# Patient Record
Sex: Male | Born: 1960 | Race: Black or African American | Hispanic: No | Marital: Single | Smoking: Current every day smoker
Health system: Southern US, Community
[De-identification: ages and names within clinical notes are randomized; demographics above are authoritative.]

## PROBLEM LIST (undated history)

## (undated) DIAGNOSIS — C159 Malignant neoplasm of esophagus, unspecified: Secondary | ICD-10-CM

## (undated) DIAGNOSIS — I10 Essential (primary) hypertension: Secondary | ICD-10-CM

## (undated) DIAGNOSIS — M1990 Unspecified osteoarthritis, unspecified site: Secondary | ICD-10-CM

## (undated) DIAGNOSIS — Z923 Personal history of irradiation: Secondary | ICD-10-CM

## (undated) DIAGNOSIS — Z9221 Personal history of antineoplastic chemotherapy: Secondary | ICD-10-CM

## (undated) DIAGNOSIS — M25569 Pain in unspecified knee: Secondary | ICD-10-CM

## (undated) DIAGNOSIS — C419 Malignant neoplasm of bone and articular cartilage, unspecified: Secondary | ICD-10-CM

## (undated) HISTORY — DX: Unspecified osteoarthritis, unspecified site: M19.90

## (undated) HISTORY — PX: ESOPHAGOGASTRODUODENOSCOPY (EGD) WITH ESOPHAGEAL DILATION: SHX5812

## (undated) HISTORY — PX: KNEE SURGERY: SHX244

---
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
## 1997-12-16 ENCOUNTER — Inpatient Hospital Stay (HOSPITAL_COMMUNITY): Admission: EM | Admit: 1997-12-16 | Discharge: 1997-12-22 | Payer: Self-pay | Admitting: Emergency Medicine

## 1998-01-22 ENCOUNTER — Ambulatory Visit (HOSPITAL_COMMUNITY): Admission: RE | Admit: 1998-01-22 | Discharge: 1998-01-22 | Payer: Self-pay | Admitting: Hematology and Oncology

## 1998-02-02 ENCOUNTER — Ambulatory Visit (HOSPITAL_COMMUNITY): Admission: RE | Admit: 1998-02-02 | Discharge: 1998-02-02 | Payer: Self-pay | Admitting: Otolaryngology

## 1998-02-10 ENCOUNTER — Other Ambulatory Visit: Admission: RE | Admit: 1998-02-10 | Discharge: 1998-02-10 | Payer: Self-pay | Admitting: Hematology and Oncology

## 1998-11-17 ENCOUNTER — Encounter: Payer: Self-pay | Admitting: Family Medicine

## 1998-11-17 ENCOUNTER — Ambulatory Visit (HOSPITAL_COMMUNITY): Admission: RE | Admit: 1998-11-17 | Discharge: 1998-11-17 | Payer: Self-pay | Admitting: Family Medicine

## 1998-12-21 ENCOUNTER — Ambulatory Visit (HOSPITAL_COMMUNITY): Admission: RE | Admit: 1998-12-21 | Discharge: 1998-12-21 | Payer: Self-pay | Admitting: Family Medicine

## 1998-12-21 ENCOUNTER — Encounter: Payer: Self-pay | Admitting: Family Medicine

## 2001-02-07 ENCOUNTER — Emergency Department (HOSPITAL_COMMUNITY): Admission: EM | Admit: 2001-02-07 | Discharge: 2001-02-07 | Payer: Self-pay | Admitting: Emergency Medicine

## 2012-07-21 ENCOUNTER — Emergency Department (HOSPITAL_COMMUNITY)
Admission: EM | Admit: 2012-07-21 | Discharge: 2012-07-21 | Disposition: A | Discharge status: Home / Self Care | Payer: Self-pay | Source: Home / Self Care | Attending: Emergency Medicine | Admitting: Emergency Medicine

## 2012-07-21 ENCOUNTER — Encounter (HOSPITAL_COMMUNITY): Payer: Self-pay | Admitting: Emergency Medicine

## 2012-07-21 DIAGNOSIS — T148XXA Other injury of unspecified body region, initial encounter: Secondary | ICD-10-CM

## 2012-07-21 HISTORY — DX: Essential (primary) hypertension: I10

## 2012-07-21 MED ORDER — CYCLOBENZAPRINE HCL 5 MG PO TABS: 5.0000 mg | ORAL_TABLET | Freq: Three times a day (TID) | ORAL | Status: DC | PRN

## 2012-07-21 NOTE — ED Provider Notes (Signed)
Medical screening examination/treatment/procedure(s) were performed by non-physician practitioner and as supervising physician I was immediately available for consultation/collaboration.  Leslee Home, M.D.   Reuben Likes, MD 07/21/12 [phone]

## 2012-07-21 NOTE — ED Provider Notes (Signed)
History     CSN: [id number]  Arrival date & time 07/21/12  4098   First MD Initiated Contact with Patient 07/21/12 1052      Chief Complaint  Patient presents with  . Back Pain    strained back. sharp pain in shoulder blades radiates up neck and through out chest. hurts to cough. heavy lifting with work    (Consider location/radiation/quality/duration/timing/severity/associated sxs/prior treatment) Patient is a 51 y.o. male presenting with back pain. The history is provided by the patient.  Back Pain  This is a new problem. Episode onset: 3 days ago. The problem occurs constantly. The problem has not changed since onset.The pain is associated with lifting heavy objects. The pain is present in the thoracic spine. The quality of the pain is described as aching. Radiates to: R neck and shoulder. The pain is at a severity of 5/10. Exacerbated by: actively using muscle. The pain is the same all the time. Pertinent negatives include no chest pain, no fever, no numbness, no headaches, no tingling and no weakness. Treatments tried: flexall cream?? The treatment provided no relief.    Past Medical History  Diagnosis Date  . Hypertension     Past Surgical History  Procedure Date  . Knee surgery     History reviewed. No pertinent family history.  History  Substance Use Topics  . Smoking status: Current Every Day Smoker -- 0.5 packs/day    Types: Cigarettes  . Smokeless tobacco: Not on file  . Alcohol Use: No      Review of Systems  Constitutional: Negative for fever and chills.  HENT: Positive for neck pain.   Cardiovascular: Negative for chest pain.  Musculoskeletal: Positive for back pain.  Skin: Negative for rash and wound.  Neurological: Negative for tingling, weakness, numbness and headaches.    Allergies  Review of patient's allergies indicates no known allergies.  Home Medications   Current Outpatient Rx  Name  Route  Sig  Dispense  Refill  . CYCLOBENZAPRINE  HCL 5 MG PO TABS   Oral   Take 1 tablet (5 mg total) by mouth 3 (three) times daily as needed for muscle spasms.   21 tablet   0     BP 142/85  Pulse 85  Temp 98.7 F (37.1 C) (Oral)  Resp 16  SpO2 100%  Physical Exam  Constitutional: He appears well-developed and well-nourished. No distress.  Musculoskeletal:       Right shoulder: Normal.       Cervical back: He exhibits tenderness, swelling and spasm. He exhibits normal range of motion, no bony tenderness and no deformity.       Back:  Skin: Skin is warm, dry and intact. No rash noted. No erythema.    ED Course  Procedures (including critical care time)  Labs Reviewed - No data to display No results found.   1. Muscle strain       MDM          Cathlyn Parsons, NP 07/21/12 1109

## 2012-07-21 NOTE — ED Notes (Signed)
Pt c/o muscle strain in back. Sharp pain in shoulder blades that radiate up the back of the neck and chest pain with coughing. Pt states does a lot of heavy lifting at work.

## 2014-06-30 ENCOUNTER — Encounter: Payer: Self-pay | Admitting: Internal Medicine

## 2014-07-17 ENCOUNTER — Ambulatory Visit (AMBULATORY_SURGERY_CENTER): Payer: Self-pay | Admitting: *Deleted

## 2014-07-17 VITALS — Ht 69.0 in | Wt 131.0 lb

## 2014-07-17 DIAGNOSIS — Z1211 Encounter for screening for malignant neoplasm of colon: Secondary | ICD-10-CM

## 2014-07-17 MED ORDER — MOVIPREP 100 G PO SOLR: 1.0000 | Freq: Once | ORAL | Status: DC

## 2014-07-17 NOTE — Progress Notes (Signed)
No egg or soy allergy. No anesthesia problems.  No home O2.  No diet meds.  

## 2014-08-12 ENCOUNTER — Encounter: Payer: Self-pay | Admitting: Internal Medicine

## 2014-08-12 ENCOUNTER — Ambulatory Visit (AMBULATORY_SURGERY_CENTER): Payer: 59 | Admitting: Internal Medicine

## 2014-08-12 VITALS — BP 147/73 | HR 49 | Temp 98.1°F | Resp 30 | Ht 69.0 in | Wt 131.0 lb

## 2014-08-12 DIAGNOSIS — Z1211 Encounter for screening for malignant neoplasm of colon: Secondary | ICD-10-CM

## 2014-08-12 MED ORDER — SODIUM CHLORIDE 0.9 % IV SOLN: 500.0000 mL | INTRAVENOUS | Status: DC

## 2014-08-12 NOTE — Progress Notes (Signed)
A/ox3 pleased with MAC, report to Annette RN 

## 2014-08-12 NOTE — Patient Instructions (Signed)
YOU HAD AN ENDOSCOPIC PROCEDURE TODAY AT THE [facility]: Refer to the procedure report that was given to you for any specific questions about what was found during the examination.  If the procedure report does not answer your questions, please call your gastroenterologist to clarify.  If you requested that your care partner not be given the details of your procedure findings, then the procedure report has been included in a sealed envelope for you to review at your convenience later.  YOU SHOULD EXPECT: Some feelings of bloating in the abdomen. Passage of more gas than usual.  Walking can help get rid of the air that was put into your GI tract during the procedure and reduce the bloating. If you had a lower endoscopy (such as a colonoscopy or flexible sigmoidoscopy) you may notice spotting of blood in your stool or on the toilet paper. If you underwent a bowel prep for your procedure, then you may not have a normal bowel movement for a few days.  DIET: Your first meal following the procedure should be a light meal and then it is ok to progress to your normal diet.  A half-sandwich or bowl of soup is an example of a good first meal.  Heavy or fried foods are harder to digest and may make you feel nauseous or bloated.  Likewise meals heavy in dairy and vegetables can cause extra gas to form and this can also increase the bloating.  Drink plenty of fluids but you should avoid alcoholic beverages for 24 hours.  ACTIVITY: Your care partner should take you home directly after the procedure.  You should plan to take it easy, moving slowly for the rest of the day.  You can resume normal activity the day after the procedure however you should NOT DRIVE or use heavy machinery for 24 hours (because of the sedation medicines used during the test).    SYMPTOMS TO REPORT IMMEDIATELY: A gastroenterologist can be reached at any hour.  During normal business hours, 8:30 AM to 5:00 PM Monday through Friday,  call [phone].  After hours and on weekends, please call the GI answering service at [phone] who will take a message and have the physician on call contact you.   Following lower endoscopy (colonoscopy or flexible sigmoidoscopy):  Excessive amounts of blood in the stool  Significant tenderness or worsening of abdominal pains  Swelling of the abdomen that is new, acute  Fever of 100F or higher  FOLLOW UP: If any biopsies were taken you will be contacted by phone or by letter within the next 1-3 weeks.  Call your gastroenterologist if you have not heard about the biopsies in 3 weeks.  Our staff will call the home number listed on your records the next business day following your procedure to check on you and address any questions or concerns that you may have at that time regarding the information given to you following your procedure. This is a courtesy call and so if there is no answer at the home number and we have not heard from you through the emergency physician on call, we will assume that you have returned to your regular daily activities without incident.  SIGNATURES/CONFIDENTIALITY: You and/or your care partner have signed paperwork which will be entered into your electronic medical record.  These signatures attest to the fact that that the information above on your After Visit Summary has been reviewed and is understood.  Full responsibility of the confidentiality of this   discharge information lies with you and/or your care-partner.     Handouts were given to your care partner on a high fiber diet with liberal fluid intake and hemorrhoids. You may resume your current medications today. Please call if any questions or concerns.

## 2014-08-12 NOTE — Op Note (Signed)
[place]  [address]. [place], [address]   COLONOSCOPY PROCEDURE REPORT  PATIENT: Samuel Henderson, Samuel Henderson  MR#: [id number] BIRTHDATE: 1960-11-22 , 25  yrs. old GENDER: male ENDOSCOPIST: Jerene Bears, MD REFERRED AJ:GOTLXB Philip Aspen, M.D. PROCEDURE DATE:  08/12/2014 PROCEDURE:   Colonoscopy, screening First Screening Colonoscopy - Avg.  risk and is 50 yrs.  old or older Yes.  Prior Negative Screening - Now for repeat screening. N/A  History of Adenoma - Now for follow-up colonoscopy & has been > or = to 3 yrs.  N/A  Polyps Removed Today? No.  Recommend repeat exam, <10 yrs? Polyps Removed Today? No.  Recommend repeat exam, <10 yrs? No. ASA CLASS:   Class II INDICATIONS:average risk for colon cancer and first colonoscopy. MEDICATIONS: Propofol 350 mg IV and Monitored anesthesia care  DESCRIPTION OF PROCEDURE:   After the risks benefits and alternatives of the procedure were thoroughly explained, informed consent was obtained.  The digital rectal exam revealed no abnormalities of the rectum.   The LB [id number]  endoscope was introduced through the anus and advanced to the cecum, which was identified by both the appendix and ileocecal valve. No adverse events experienced.   The quality of the prep was good, using MoviPrep  The instrument was then slowly withdrawn as the colon was fully examined.      COLON FINDINGS: A normal appearing cecum, ileocecal valve, and appendiceal orifice were identified.  The ascending, transverse, descending, sigmoid colon, and rectum appeared unremarkable. Retroflexed views revealed medium-sized internal hemorrhoids. The time to cecum=2 minutes 54 seconds.  Withdrawal time=7 minutes 56 seconds.  The scope was withdrawn and the procedure completed.  COMPLICATIONS: There were no immediate complications.  ENDOSCOPIC IMPRESSION: Normal colonoscopy, internal hemorrhoids  RECOMMENDATIONS: You should continue to follow  colorectal cancer screening guidelines for "routine risk" patients with a repeat colonoscopy in 10 years. There is no need for FOBT (stool) testing for at least 5 years.  eSigned:  Jerene Bears, MD 08/12/2014 3:09 PM   cc: Leanna Battles, MD and The Patient

## 2014-08-12 NOTE — Progress Notes (Signed)
No problems noted in the recovery room. maw 

## 2014-08-13 ENCOUNTER — Telehealth: Payer: Self-pay | Admitting: *Deleted

## 2014-08-13 NOTE — Telephone Encounter (Signed)
  Follow up Call-  Call back number 08/12/2014  Post procedure Call Back phone  # [phone] or [phone] (leave message here)  Permission to leave phone message Yes     Patient questions:  Do you have a fever, pain , or abdominal swelling? No. Pain Score  0 *  Have you tolerated food without any problems? Yes.    Have you been able to return to your normal activities? Yes.    Do you have any questions about your discharge instructions: Diet   No. Medications  No. Follow up visit  No.  Do you have questions or concerns about your Care? No.  Actions: * If pain score is 4 or above: No action needed, pain <4.

## 2015-08-17 ENCOUNTER — Emergency Department (HOSPITAL_COMMUNITY): Payer: 59

## 2015-08-17 ENCOUNTER — Emergency Department (HOSPITAL_COMMUNITY)
Admission: EM | Admit: 2015-08-17 | Discharge: 2015-08-18 | Disposition: A | Discharge status: Home / Self Care | Payer: 59 | Attending: Physician Assistant | Admitting: Physician Assistant

## 2015-08-17 ENCOUNTER — Encounter (HOSPITAL_COMMUNITY): Payer: Self-pay | Admitting: Emergency Medicine

## 2015-08-17 DIAGNOSIS — R079 Chest pain, unspecified: Secondary | ICD-10-CM | POA: Diagnosis present

## 2015-08-17 DIAGNOSIS — F17210 Nicotine dependence, cigarettes, uncomplicated: Secondary | ICD-10-CM | POA: Diagnosis not present

## 2015-08-17 DIAGNOSIS — Z791 Long term (current) use of non-steroidal anti-inflammatories (NSAID): Secondary | ICD-10-CM | POA: Diagnosis not present

## 2015-08-17 DIAGNOSIS — I10 Essential (primary) hypertension: Secondary | ICD-10-CM | POA: Insufficient documentation

## 2015-08-17 DIAGNOSIS — M1990 Unspecified osteoarthritis, unspecified site: Secondary | ICD-10-CM | POA: Diagnosis not present

## 2015-08-17 DIAGNOSIS — Z79899 Other long term (current) drug therapy: Secondary | ICD-10-CM | POA: Insufficient documentation

## 2015-08-17 DIAGNOSIS — R42 Dizziness and giddiness: Secondary | ICD-10-CM | POA: Insufficient documentation

## 2015-08-17 DIAGNOSIS — R55 Syncope and collapse: Secondary | ICD-10-CM | POA: Insufficient documentation

## 2015-08-17 DIAGNOSIS — R1310 Dysphagia, unspecified: Secondary | ICD-10-CM | POA: Insufficient documentation

## 2015-08-17 LAB — I-STAT TROPONIN, ED: TROPONIN I, POC: 0.01 ng/mL (ref 0.00–0.08)

## 2015-08-17 LAB — CBC
HEMATOCRIT: 37.1 % — AB (ref 39.0–52.0)
HEMOGLOBIN: 12.2 g/dL — AB (ref 13.0–17.0)
MCH: 31 pg (ref 26.0–34.0)
MCHC: 32.9 g/dL (ref 30.0–36.0)
MCV: 94.4 fL (ref 78.0–100.0)
Platelets: 374 10*3/uL (ref 150–400)
RBC: 3.93 MIL/uL — AB (ref 4.22–5.81)
RDW: 14.7 % (ref 11.5–15.5)
WBC: 9.1 10*3/uL (ref 4.0–10.5)

## 2015-08-17 LAB — BASIC METABOLIC PANEL
ANION GAP: 9 (ref 5–15)
BUN: 10 mg/dL (ref 6–20)
CALCIUM: 9.3 mg/dL (ref 8.9–10.3)
CO2: 30 mmol/L (ref 22–32)
Chloride: 99 mmol/L — ABNORMAL LOW (ref 101–111)
Creatinine, Ser: 0.83 mg/dL (ref 0.61–1.24)
GLUCOSE: 102 mg/dL — AB (ref 65–99)
POTASSIUM: 2.8 mmol/L — AB (ref 3.5–5.1)
Sodium: 138 mmol/L (ref 135–145)

## 2015-08-17 MED ORDER — POTASSIUM CHLORIDE 10 MEQ/100ML IV SOLN
10.0000 meq | Freq: Once | INTRAVENOUS | Status: AC
  Administered 2015-08-18: 10 meq via INTRAVENOUS
  Filled 2015-08-17: qty 100

## 2015-08-17 NOTE — ED Provider Notes (Signed)
CSN: [id number]     Arrival date & time 08/17/15  2124 History  By signing my name below, I, Samuel Henderson, attest that this documentation has been prepared under the direction and in the presence of Duel Conrad Julio Alm, MD. Electronically Signed: Altamease Henderson, ED Scribe. 08/18/2015. 12:06 AM   Chief Complaint  Patient presents with  . Chest Pain   The history is provided by the patient. No language interpreter was used.   Samuel Henderson is a 54 y.o. male who presents to the Emergency Department complaining of difficulty swallowing with onset 2-3 weeks ago. Tonight around 8 PM the pt was eating pork chop that got caught in his throat. After forcing the food down he had an episode of central chest pain. He attempted to force himself to vomit but was only able to bring up water. Pt states that after attempting to vomit he had a brief syncopal episode with no injury. Prior to the syncopal episode he was pale and lightheaded. Pt denies hematochezia, dark stool, hematemesis, insomnia, night sweats, recent weight loss, fever, nausea, vomiting, weakness, gait difficulty. His last colonoscopy was on 08/18/14.    Past Medical History  Diagnosis Date  . Hypertension   . Arthritis    Past Surgical History  Procedure Laterality Date  . Knee surgery    . Lesion removal  1969    hip   Family History  Problem Relation Age of Onset  . Colon cancer Neg Hx    Social History  Substance Use Topics  . Smoking status: Current Every Day Smoker -- 0.00 packs/day    Types: Cigarettes  . Smokeless tobacco: Never Used  . Alcohol Use: No    Review of Systems  Constitutional: Negative for fever.  HENT: Positive for trouble swallowing.   Respiratory: Negative for shortness of breath.   Cardiovascular: Positive for chest pain.  Gastrointestinal: Negative for nausea, vomiting, abdominal pain, blood in stool and anal bleeding.  Musculoskeletal: Negative for gait problem.  Skin: Positive for  color change.  Neurological: Positive for syncope and light-headedness. Negative for weakness.   Allergies  Review of patient's allergies indicates no known allergies.  Home Medications   Prior to Admission medications   Medication Sig Start Date End Date Taking? Authorizing Provider  amLODipine (NORVASC) 5 MG tablet Take 5 mg by mouth daily.   Yes Historical Provider, MD  lisinopril-hydrochlorothiazide (PRINZIDE,ZESTORETIC) 10-12.5 MG per tablet Take 1 tablet by mouth daily.   Yes Historical Provider, MD  meloxicam (MOBIC) 15 MG tablet Take 15 mg by mouth daily.   Yes Historical Provider, MD   BP 129/82 mmHg  Pulse 71  Temp(Src) 98.7 F (37.1 C) (Oral)  Resp 19  Ht 5\' 8"  (1.727 m)  Wt 130 lb (58.968 kg)  BMI 19.77 kg/m2  SpO2 99% Physical Exam  Constitutional: He is oriented to person, place, and time. He appears well-developed and well-nourished.  HENT:  Head: Normocephalic and atraumatic.  Eyes: EOM are normal.  Neck: Normal range of motion.  Cardiovascular: Normal rate, regular rhythm, normal heart sounds and intact distal pulses.   Pulmonary/Chest: Effort normal and breath sounds normal. No respiratory distress.  Equal breath sounds bilaterally  Abdominal: Soft. He exhibits no distension. There is no tenderness.  Genitourinary:  No stool  Musculoskeletal: Normal range of motion. He exhibits no edema.  Neurological: He is alert and oriented to person, place, and time.  Skin: Skin is warm and dry.  Psychiatric: He has a normal mood  and affect. Judgment normal.  Nursing note and vitals reviewed.   ED Course  Procedures (including critical care time)  DIAGNOSTIC STUDIES: Oxygen Saturation is 99% on RA,  normal by my interpretation.    COORDINATION OF CARE: 11:19 PM Discussed treatment plan which includes lab work, CXR, and EKG with pt at bedside and pt agreed to plan.  Labs Review Labs Reviewed  BASIC METABOLIC PANEL - Abnormal; Notable for the following:     Potassium 2.8 (*)    Chloride 99 (*)    Glucose, Bld 102 (*)    All other components within normal limits  CBC - Abnormal; Notable for the following:    RBC 3.93 (*)    Hemoglobin 12.2 (*)    HCT 37.1 (*)    All other components within normal limits  I-STAT TROPOININ, ED  Randolm Idol, ED    Imaging Review Dg Chest 2 View  08/17/2015  CLINICAL DATA:  Chest pain and shortness of Breath EXAM: CHEST - 2 VIEW COMPARISON:  None. FINDINGS: Cardiac shadow is within normal limits. The lungs are well aerated bilaterally. Bilateral nipple shadows are noted. No acute bony abnormality is seen. No focal infiltrate is noted. IMPRESSION: No active disease. Electronically Signed   By: Inez Catalina M.D.   On: 08/17/2015 21:49   I have personally reviewed and evaluated these images and lab results as part of my medical decision-making.   EKG Interpretation   Date/Time:  Tuesday August 17 2015 21:34:10 EST Ventricular Rate:  71 PR Interval:  158 QRS Duration: 100 QT Interval:  388 QTC Calculation: 421 R Axis:   83 Text Interpretation:  Normal sinus rhythm Anteroseptal infarct , age  undetermined Abnormal ECG st elevation isolated V3, reassuring sloping  Non-specific intra-ventricular conduction block Confirmed by Gerald Leitz ([id number]) on 08/17/2015 11:04:41 PM      MDM   Final diagnoses:  Trouble swallowing    Patient is a 54 year old male with difficulty swallowing for the last 2-3 weeks. He reports today at 8 PM he was eating a pork chop and it got stuck in his throat. He coughed and try to vomit multiple times. Afterwards he felt lightheaded like he was going to syncopize.  [facility] did last colonoscopy one year ago.   I think this likely represents a food bolus, difficulty transfer dysphasia. He will need to be worked up by GI for this. Concern for malignancy versus stricture. We will see the patient can take by mouth of thinner quality. We will do serial troponins.   Denies  any GI bleeding symptoms however he has low hemoglobin here so we will do a rectal exam.  I personally performed the services described in this documentation, which was scribed in my presence. The recorded information has been reviewed and is accurate.    Rectal had no stool. Long discussion with patietn and family about need to follow up with PCP adn GI immediately.   Smriti Barkow Julio Alm, MD 08/18/15 [phone]

## 2015-08-17 NOTE — ED Notes (Signed)
Pt. reports intermittent central chest pain with nausea , emesis and diaphoresis for 2 weeks , also stated he " passed out" prior to arrival with no injury .

## 2015-08-17 NOTE — ED Notes (Signed)
Verified with nurse first, patient currently in xray

## 2015-08-18 ENCOUNTER — Telehealth: Payer: Self-pay | Admitting: Internal Medicine

## 2015-08-18 LAB — I-STAT TROPONIN, ED: Troponin i, poc: 0 ng/mL (ref 0.00–0.08)

## 2015-08-18 NOTE — ED Notes (Signed)
Clarified with Dr. Thomasene Lot, no hemocult card needed at this time. Allowed to discharge. reveiwed discharge instructions with patient, and follow up instructions.

## 2015-08-18 NOTE — Discharge Instructions (Signed)
You have found to have mildly low hemoglobin. This could be from blood loss. However you've no symptoms at this time. Please follow-up with your primary care physician.  In addition you had trouble swallowing. You need to follow up with the GI physician, call tomorrow morning. You already have follow-up with [place].    Dysphagia Swallowing problems (dysphagia) occur when solids and liquids seem to stick in your throat on the way down to your stomach, or the food takes longer to get to the stomach. Other symptoms include regurgitating food, noises coming from the throat, chest discomfort with swallowing, and a feeling of fullness or the feeling of something being stuck in your throat when swallowing. When blockage in your throat is complete, it may be associated with drooling. CAUSES  Problems with swallowing may occur because of problems with the muscles. The food cannot be propelled in the usual manner into your stomach. You may have ulcers, scar tissue, or inflammation in the tube down which food travels from your mouth to your stomach (esophagus), which blocks food from passing normally into the stomach. Causes of inflammation include:  Acid reflux from your stomach into your esophagus.  Infection.  Radiation treatment for cancer.  Medicines taken without enough fluids to wash them down into your stomach. You may have nerve problems that prevent signals from being sent to the muscles of your esophagus to contract and move your food down to your stomach. Globus pharyngeus is a relatively common problem in which there is a sense of an obstruction or difficulty in swallowing, without any physical abnormalities of the swallowing passages being found. This problem usually improves over time with reassurance and testing to rule out other causes. DIAGNOSIS Dysphagia can be diagnosed and its cause can be determined by tests in which you swallow a white substance that helps illuminate the inside of  your throat (contrast medium) while X-rays are taken. Sometimes a flexible telescope that is inserted down your throat (endoscopy) to look at your esophagus and stomach is used. TREATMENT   If the dysphagia is caused by acid reflux or infection, medicines may be used.  If the dysphagia is caused by problems with your swallowing muscles, swallowing therapy may be used to help you strengthen your swallowing muscles.  If the dysphagia is caused by a blockage or mass, procedures to remove the blockage may be done. HOME CARE INSTRUCTIONS  Try to eat soft food that is easier to swallow and check your weight on a daily basis to be sure that it is not decreasing.  Be sure to drink liquids when sitting upright (not lying down). SEEK MEDICAL CARE IF:  You are losing weight because you are unable to swallow.  You are coughing when you drink liquids (aspiration).  You are coughing up partially digested food. SEEK IMMEDIATE MEDICAL CARE IF:  You are unable to swallow your own saliva .  You are having shortness of breath or a fever, or both.  You have a hoarse voice along with difficulty swallowing. MAKE SURE YOU:  Understand these instructions.  Will watch your condition.  Will get help right away if you are not doing well or get worse.   This information is not intended to replace advice given to you by your health care provider. Make sure you discuss any questions you have with your health care provider.   Document Released: 08/18/2000 Document Revised: 09/11/2014 Document Reviewed: 02/07/2013 Elsevier Interactive Patient Education [employer].

## 2015-08-18 NOTE — ED Notes (Signed)
Refer to downtime charting.

## 2015-08-19 NOTE — Telephone Encounter (Signed)
Left message for pt to call back  °

## 2015-08-24 ENCOUNTER — Other Ambulatory Visit: Payer: Self-pay | Admitting: Internal Medicine

## 2015-08-24 DIAGNOSIS — R1310 Dysphagia, unspecified: Secondary | ICD-10-CM

## 2015-08-24 NOTE — Telephone Encounter (Signed)
Unable to reach pt 08/19/15@11am   Unable to reach pt 08/20/15@1 :30pm.  After multiple attempts have been unable to reach pt.

## 2015-08-27 ENCOUNTER — Ambulatory Visit
Admission: RE | Admit: 2015-08-27 | Discharge: 2015-08-27 | Disposition: A | Discharge status: Home / Self Care | Payer: 59 | Source: Ambulatory Visit | Attending: Internal Medicine | Admitting: Internal Medicine

## 2015-08-27 DIAGNOSIS — R1310 Dysphagia, unspecified: Secondary | ICD-10-CM

## 2015-09-02 ENCOUNTER — Ambulatory Visit (INDEPENDENT_AMBULATORY_CARE_PROVIDER_SITE_OTHER): Payer: 59 | Admitting: Physician Assistant

## 2015-09-02 ENCOUNTER — Other Ambulatory Visit: Payer: Self-pay

## 2015-09-02 ENCOUNTER — Other Ambulatory Visit (INDEPENDENT_AMBULATORY_CARE_PROVIDER_SITE_OTHER): Payer: 59

## 2015-09-02 ENCOUNTER — Encounter: Payer: Self-pay | Admitting: Physician Assistant

## 2015-09-02 VITALS — BP 122/76 | HR 88 | Ht 69.0 in | Wt 127.2 lb

## 2015-09-02 DIAGNOSIS — I10 Essential (primary) hypertension: Secondary | ICD-10-CM | POA: Insufficient documentation

## 2015-09-02 DIAGNOSIS — R1310 Dysphagia, unspecified: Secondary | ICD-10-CM

## 2015-09-02 DIAGNOSIS — R6889 Other general symptoms and signs: Secondary | ICD-10-CM

## 2015-09-02 LAB — COMPREHENSIVE METABOLIC PANEL
ALBUMIN: 4.2 g/dL (ref 3.5–5.2)
ALT: 14 U/L (ref 0–53)
AST: 23 U/L (ref 0–37)
Alkaline Phosphatase: 69 U/L (ref 39–117)
BILIRUBIN TOTAL: 0.5 mg/dL (ref 0.2–1.2)
BUN: 11 mg/dL (ref 6–23)
CALCIUM: 9.8 mg/dL (ref 8.4–10.5)
CHLORIDE: 97 meq/L (ref 96–112)
CO2: 33 mEq/L — ABNORMAL HIGH (ref 19–32)
CREATININE: 0.78 mg/dL (ref 0.40–1.50)
GFR: 132.33 mL/min (ref >60.00)
Glucose, Bld: 86 mg/dL (ref 70–99)
Potassium: 3.6 mEq/L (ref 3.5–5.1)
SODIUM: 139 meq/L (ref 135–145)
TOTAL PROTEIN: 7.9 g/dL (ref 6.0–8.3)

## 2015-09-02 MED ORDER — OMEPRAZOLE 20 MG PO CPDR: 20.0000 mg | DELAYED_RELEASE_CAPSULE | Freq: Every day | ORAL | Status: DC

## 2015-09-02 NOTE — Patient Instructions (Signed)
Please go to the basement level to have your labs drawn.   We sent a prescription to [facility] [place], [address]  1. Herbert Deaner (Prilosec ) 20 mg. Eat a very soft diet. Push fluids.

## 2015-09-02 NOTE — Progress Notes (Addendum)
Patient ID: MANASES CHESKY, male   DOB: [birth year], 54 y.o.   MRN: [id number]   Subjective:    Patient ID: Callie Fielding, male    DOB: 24-Sep-1960, 54 y.o.   MRN: [id number]  HPI  Apollo  is a pleasant 54 year old African-American male known to Dr. Hilarie Fredrickson from screening colonoscopy done in December 2015. This was a normal exam with the exception of internal hemorrhoids. Up he is referred today by Dr. Leanna Battles for evaluation of new onset of dysphagia. Patient states that he started having difficulty swallowing in early December 2016. He had an episode on December 13 after eating a pork chop with a piece of meat getting stuck in his esophagus. Rarely he tried very forcefully to dislodge this with regurgitation and actually had a brief syncopal episode. Appendectomy was seen and evaluated in the emergency room, but did not have a food impaction at that time. Labs were done showing a potassium of 2.8 in globin 12.2 hematocrit of 37.1 MCV of 94 and platelets 374. He was then scheduled for outpatient barium swallow which was done on 08/27/2015. This shows a long segment of narrowing in the mid thoracic esophagus measuring 5-6 cm in length. This is worrisome for malignancy versus inflammation. There is no difficulty with liquid barium but a barium tablet would not pass this area. Agent says he is now having some difficulty with liquids, with a sensation that they are slow to go through his esophagus. He is able to eat solid food but has had several episodes requiring regurgitation. He says when he eats something solid he gets a tight uncomfortable feeling in his chest until food passes. He does not have any chronic problems with heartburn or indigestion. His appetite is been fine his weight may be down 1-2 pounds. He has not had any hematemesis or melena. Exline Patient is a smoker, no EtOH in the past 17 years. Other medical problems include hypertension and osteoarthritis for which he is on  meloxicam.   Review of Systems Pertinent positive and negative review of systems were noted in the above HPI section.  All other review of systems was otherwise negative.  Outpatient Encounter Prescriptions as of 09/02/2015  Medication Sig  . amLODipine (NORVASC) 5 MG tablet Take 5 mg by mouth daily.  [name] Kitchen lisinopril-hydrochlorothiazide (PRINZIDE,ZESTORETIC) 10-12.5 MG per tablet Take 1 tablet by mouth daily.  . meloxicam (MOBIC) 15 MG tablet Take 15 mg by mouth daily.  [name] Kitchen omeprazole (PRILOSEC) 20 MG capsule Take 1 capsule (20 mg total) by mouth daily.   No facility-administered encounter medications on file as of 09/02/2015.   No Known Allergies Patient Active Problem List   Diagnosis Date Noted  . HTN (hypertension) 09/02/2015   Social History   Social History  . Marital Status: Single    Spouse Name: N/A  . Number of Children: N/A  . Years of Education: N/A   Occupational History  . Not on file.   Social History Main Topics  . Smoking status: Current Every Day Smoker -- 0.00 packs/day    Types: Cigarettes  . Smokeless tobacco: Never Used  . Alcohol Use: No  . Drug Use: Yes    Special: Marijuana  . Sexual Activity: Not on file   Other Topics Concern  . Not on file   Social History Narrative    Mr. Pencek family history is negative for Colon cancer.      Objective:    Filed Vitals:   09/02/15 1344  BP: 122/76  Pulse: 88    Physical Exam  well-developed thin older African-American male in no acute distress, pleasant blood pressure 122/76 pulse 88 height 5 foot 9 weight 127. HEENT; nontraumatic normocephalic EOMI PERRLA sclera anicteric, Cardiovascular; regular rate and rhythm with S1-S2 no murmur or gallop, Pulmonary; clear bilaterally, Abdomen ;soft nondistended nontender no palpable mass or hepatosplenomegaly bowel sounds are present, Rectal; exam not done, Extremities; no clubbing cyanosis or edema skin warm and dry, Neuropsych ;mood and affect  appropriate       Assessment & Plan:   #1 54 yo AA male with new onset of dysphagia x one month, and abnormal barium swallow showing a long segment of narrowing in mid thoracic esophagus worrisome for malignancy #2 HTN #3 OA  Plan; very soft diet discussed  Have schedule for EGD  With bx/possible dilation with Dr Hilarie Fredrickson early next week to expedite workup. Procedure discussed in detail with pt and he is agreeable to proceed.  Add Prilosec 20 mg po qam   Odette Watanabe S Ayisha Pol PA-C 09/02/2015   Cc: Leanna Battles, MD   Addendum: Reviewed and agree with management. Jerene Bears, MD

## 2015-09-07 ENCOUNTER — Telehealth: Payer: Self-pay

## 2015-09-07 ENCOUNTER — Encounter: Payer: Self-pay | Admitting: Internal Medicine

## 2015-09-07 ENCOUNTER — Ambulatory Visit (AMBULATORY_SURGERY_CENTER): Payer: 59 | Admitting: Internal Medicine

## 2015-09-07 ENCOUNTER — Other Ambulatory Visit: Payer: Self-pay

## 2015-09-07 VITALS — BP 150/89 | HR 67 | Temp 97.1°F | Resp 15 | Ht 69.0 in | Wt 127.0 lb

## 2015-09-07 DIAGNOSIS — K229 Disease of esophagus, unspecified: Secondary | ICD-10-CM | POA: Diagnosis not present

## 2015-09-07 DIAGNOSIS — R1319 Other dysphagia: Secondary | ICD-10-CM

## 2015-09-07 DIAGNOSIS — R1310 Dysphagia, unspecified: Secondary | ICD-10-CM

## 2015-09-07 DIAGNOSIS — R933 Abnormal findings on diagnostic imaging of other parts of digestive tract: Secondary | ICD-10-CM

## 2015-09-07 DIAGNOSIS — R1314 Dysphagia, pharyngoesophageal phase: Secondary | ICD-10-CM

## 2015-09-07 DIAGNOSIS — D490 Neoplasm of unspecified behavior of digestive system: Secondary | ICD-10-CM

## 2015-09-07 DIAGNOSIS — K2289 Other specified disease of esophagus: Secondary | ICD-10-CM

## 2015-09-07 DIAGNOSIS — K228 Other specified diseases of esophagus: Secondary | ICD-10-CM

## 2015-09-07 MED ORDER — SODIUM CHLORIDE 0.9 % IV SOLN: 500.0000 mL | INTRAVENOUS | Status: DC

## 2015-09-07 NOTE — Op Note (Signed)
[place]  [address] [place], [id number]   ENDOSCOPY PROCEDURE REPORT  PATIENT: Samuel Henderson, Samuel Henderson  MR#: [id number] BIRTHDATE: August 02, 1961 , 6  yrs. old GENDER: male ENDOSCOPIST: Jerene Bears, MD REFERRED BY:  Leanna Battles, M.D. PROCEDURE DATE:  09/07/2015 PROCEDURE:  EGD w/ biopsy ASA CLASS:     Class II INDICATIONS:  dysphagia and abnormal barium esophagogram. MEDICATIONS: Monitored anesthesia care and Propofol 200 mg IV TOPICAL ANESTHETIC: none  DESCRIPTION OF PROCEDURE: After the risks benefits and alternatives of the procedure were thoroughly explained, informed consent was obtained.  The [id number] endoscope was introduced through the mouth and advanced to the second portion of the duodenum , Without limitations.  The instrument was slowly withdrawn as the mucosa was fully examined.    ESOPHAGUS: A circumferential ulcerated mass with friable surfaces was found beginning 30 cm from the incisors and extending to 37 cm. There is a 3 cm area of normal appearing squamous mucosa before the Z-line which is located at 40 cm.  Multiple biopsies were performed using cold forceps.  Above the mass there is mild dilation and esophagitis with more salmon-colored mucosa. There is a 2 cm hiatal hernia.  The tumor causes luminal narrowing but is not currently obstructive.  STOMACH: A 2 cm hiatal hernia was noted.   The mucosa of the stomach appeared normal.  DUODENUM: The duodenal mucosa showed no abnormalities in the bulb and 2nd part of the duodenum.  Retroflexed views revealed a hiatal hernia, but no tumor was seen. The scope was then withdrawn from the patient and the procedure completed.  COMPLICATIONS: There were no immediate complications.  ENDOSCOPIC IMPRESSION: 1.   7cm circumferential tumor was found beginning 30 cm from the incisors; multiple biopsies were performed 2.   2 cm hiatal hernia 3.   The mucosa of the stomach appeared  normal 4.   The duodenal mucosa showed no abnormalities in the bulb and 2nd part of the duodenum  RECOMMENDATIONS: 1.  Await biopsy results 2.  CT scan chest, abdomen and pelvis 3.  Oncology and surgical referrals  eSigned:  Jerene Bears, MD 09/07/2015 10:42 AM    CC: the patient, Dr. Philip Aspen  PATIENT NAME:  Samuel Henderson, Samuel Henderson MR#: [id number]

## 2015-09-07 NOTE — Progress Notes (Signed)
Called to room to assist during endoscopic procedure.  Patient ID and intended procedure confirmed with present staff. Received instructions for my participation in the procedure from the performing physician.  

## 2015-09-07 NOTE — Progress Notes (Signed)
Report to PACU, RN, vss, BBS= Clear.  

## 2015-09-07 NOTE — Telephone Encounter (Signed)
Pt scheduled for CT of CAP at [place] [facility] 09/09/15@2 :30pm. Pt to be NPO after 10:30am except bottle one of contrast at 12:30pm and bottle 2 at 1:30pm. [place] RN to notify pt of appt.

## 2015-09-07 NOTE — Patient Instructions (Signed)
Impressions/recommendations:  Tumor Hiatal hernia (handout given)  Await biopsy results CT Scan chest, abdomen and pelvis Oncology and surgical referrals  CT Scan Thursday September 09, 2015 at 230 pm (handout given)  YOU HAD AN ENDOSCOPIC PROCEDURE TODAY AT [place]:   Refer to the procedure report that was given to you for any specific questions about what was found during the examination.  If the procedure report does not answer your questions, please call your gastroenterologist to clarify.  If you requested that your care partner not be given the details of your procedure findings, then the procedure report has been included in a sealed envelope for you to review at your convenience later.  YOU SHOULD EXPECT: Some feelings of bloating in the abdomen. Passage of more gas than usual.  Walking can help get rid of the air that was put into your GI tract during the procedure and reduce the bloating. If you had a lower endoscopy (such as a colonoscopy or flexible sigmoidoscopy) you may notice spotting of blood in your stool or on the toilet paper. If you underwent a bowel prep for your procedure, you may not have a normal bowel movement for a few days.  Please Note:  You might notice some irritation and congestion in your nose or some drainage.  This is from the oxygen used during your procedure.  There is no need for concern and it should clear up in a day or so.  SYMPTOMS TO REPORT IMMEDIATELY:   Following upper endoscopy (EGD)  Vomiting of blood or coffee ground material  New chest pain or pain under the shoulder blades  Painful or persistently difficult swallowing  New shortness of breath  Fever of 100F or higher  Black, tarry-looking stools  For urgent or emergent issues, a gastroenterologist can be reached at any hour by calling [phone].   DIET: Your first meal following the procedure should be a small meal and then it is ok to progress to your normal  diet. Heavy or fried foods are harder to digest and may make you feel nauseous or bloated.  Likewise, meals heavy in dairy and vegetables can increase bloating.  Drink plenty of fluids but you should avoid alcoholic beverages for 24 hours.  ACTIVITY:  You should plan to take it easy for the rest of today and you should NOT DRIVE or use heavy machinery until tomorrow (because of the sedation medicines used during the test).    FOLLOW UP: Our staff will call the number listed on your records the next business day following your procedure to check on you and address any questions or concerns that you may have regarding the information given to you following your procedure. If we do not reach you, we will leave a message.  However, if you are feeling well and you are not experiencing any problems, there is no need to return our call.  We will assume that you have returned to your regular daily activities without incident.  If any biopsies were taken you will be contacted by phone or by letter within the next 1-3 weeks.  Please call us at [phone] if you have not heard about the biopsies in 3 weeks.    SIGNATURES/CONFIDENTIALITY: You and/or your care partner have signed paperwork which will be entered into your electronic medical record.  These signatures attest to the fact that that the information above on your After Visit Summary has been reviewed and is understood.  Full responsibility  of the confidentiality of this discharge information lies with you and/or your care-partner.

## 2015-09-08 ENCOUNTER — Telehealth: Payer: Self-pay

## 2015-09-08 ENCOUNTER — Other Ambulatory Visit: Payer: Self-pay

## 2015-09-08 DIAGNOSIS — C159 Malignant neoplasm of esophagus, unspecified: Secondary | ICD-10-CM

## 2015-09-08 NOTE — Telephone Encounter (Signed)
Left a message on pt's answering machine at [phone] for the pt to call us back if any questions or concerns. maw

## 2015-09-09 ENCOUNTER — Ambulatory Visit (INDEPENDENT_AMBULATORY_CARE_PROVIDER_SITE_OTHER)
Admission: RE | Admit: 2015-09-09 | Discharge: 2015-09-09 | Disposition: A | Discharge status: Home / Self Care | Payer: 59 | Source: Ambulatory Visit | Attending: Internal Medicine | Admitting: Internal Medicine

## 2015-09-09 DIAGNOSIS — D490 Neoplasm of unspecified behavior of digestive system: Secondary | ICD-10-CM

## 2015-09-09 DIAGNOSIS — D379 Neoplasm of uncertain behavior of digestive organ, unspecified: Secondary | ICD-10-CM

## 2015-09-09 MED ORDER — IOHEXOL 300 MG/ML  SOLN
100.0000 mL | Freq: Once | INTRAMUSCULAR | Status: AC | PRN
  Administered 2015-09-09: 100 mL via INTRAVENOUS

## 2015-09-10 ENCOUNTER — Telehealth: Payer: Self-pay | Admitting: *Deleted

## 2015-09-10 NOTE — Telephone Encounter (Signed)
Message received in [place] from pt stating he is returning a call to this office " I believe it is regarding an appointment ".  Return call number given as [phone].  Noted pt has new patient appointment.  This call will be forwarded to [place] in HIM for appropriate pt contact.

## 2015-09-13 ENCOUNTER — Telehealth: Payer: Self-pay | Admitting: *Deleted

## 2015-09-13 NOTE — Telephone Encounter (Signed)
Oncology Nurse Navigator Documentation  Oncology Nurse Navigator Flowsheets 09/13/2015  Navigator Location [facility]  Navigator Encounter Type Introductory phone call  Mother called back to confirm his appointments on 09/15/15 with Dr. Tammi Klippel and Dr. Irene Limbo.

## 2015-09-14 ENCOUNTER — Encounter: Payer: Self-pay | Admitting: Radiation Oncology

## 2015-09-14 NOTE — Progress Notes (Signed)
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 He attempted to force himself to vomit but, was unsuccessful. Reports having a brief syncopal episode without injury thereafter. Reported at this time difficulty swallowing had been present for 2-3 weeks.  Biopsies of esophagus (if applicable) revealed:    Nutrition Status Yes No Comments  Weight changes? []  [x]    Swallowing concerns? [x]  []  Difficulty swallowing continues but is rarely painful (burning)  PEG? []  [x]     Referrals Yes No Comments  Social Work? []  [x]    Dentistry? []  [x]  Hasn't seen a dentist in 6-7 years   Swallowing therapy? []  [x]    Nutrition? []  [x]    Med/Onc? [x]  []  Schedule for consult with Dr. Irene Limbo 09/15/2015   Safety Issues Yes No Comments  Prior radiation? []  [x]    Pacemaker/ICD? []  [x]    Possible current pregnancy? []  [x]    Is the patient on methotrexate? []  [x]     Tobacco/Marijuana/Snuff/ETOH use: Current everyday smoker  Past/Anticipated interventions by otolaryngology, if any: 09/07/2015 EGD with biopsy performed  Past/Anticipated interventions by medical oncology, if any: Scheduled for consult with Dr. Irene Limbo on 09/15/2015  Sister has multiple myeloma. Father had nasopharyngeal cancer.   Reports history of night sweats but, none recently.     Current Complaints / other details: 55 year old male. Single. Chief Complaint  Patient presents with  . Cancer    Consult/Dr. Manning/Esophageal ca/Referral from [facility]

## 2015-09-15 ENCOUNTER — Ambulatory Visit
Admission: RE | Admit: 2015-09-15 | Discharge: 2015-09-15 | Disposition: A | Discharge status: Home / Self Care | Payer: 59 | Source: Ambulatory Visit | Attending: Radiation Oncology | Admitting: Radiation Oncology

## 2015-09-15 ENCOUNTER — Ambulatory Visit (HOSPITAL_BASED_OUTPATIENT_CLINIC_OR_DEPARTMENT_OTHER): Payer: 59 | Admitting: Hematology

## 2015-09-15 ENCOUNTER — Encounter: Payer: Self-pay | Admitting: Radiation Oncology

## 2015-09-15 ENCOUNTER — Encounter: Payer: Self-pay | Admitting: *Deleted

## 2015-09-15 ENCOUNTER — Telehealth: Payer: Self-pay | Admitting: Hematology

## 2015-09-15 ENCOUNTER — Encounter: Payer: Self-pay | Admitting: Hematology

## 2015-09-15 VITALS — BP 142/83 | HR 58 | Temp 98.3°F | Resp 18 | Ht 69.0 in | Wt 126.8 lb

## 2015-09-15 VITALS — BP 148/102 | HR 48 | Resp 16 | Ht 69.0 in | Wt 127.8 lb

## 2015-09-15 DIAGNOSIS — I10 Essential (primary) hypertension: Secondary | ICD-10-CM | POA: Diagnosis not present

## 2015-09-15 DIAGNOSIS — K769 Liver disease, unspecified: Secondary | ICD-10-CM | POA: Diagnosis not present

## 2015-09-15 DIAGNOSIS — C155 Malignant neoplasm of lower third of esophagus: Secondary | ICD-10-CM | POA: Insufficient documentation

## 2015-09-15 DIAGNOSIS — Z510 Encounter for antineoplastic radiation therapy: Secondary | ICD-10-CM | POA: Diagnosis not present

## 2015-09-15 DIAGNOSIS — F1210 Cannabis abuse, uncomplicated: Secondary | ICD-10-CM | POA: Diagnosis not present

## 2015-09-15 DIAGNOSIS — R1310 Dysphagia, unspecified: Secondary | ICD-10-CM

## 2015-09-15 DIAGNOSIS — F17210 Nicotine dependence, cigarettes, uncomplicated: Secondary | ICD-10-CM | POA: Insufficient documentation

## 2015-09-15 DIAGNOSIS — C159 Malignant neoplasm of esophagus, unspecified: Secondary | ICD-10-CM | POA: Insufficient documentation

## 2015-09-15 DIAGNOSIS — Z800 Family history of malignant neoplasm of digestive organs: Secondary | ICD-10-CM | POA: Insufficient documentation

## 2015-09-15 DIAGNOSIS — C154 Malignant neoplasm of middle third of esophagus: Secondary | ICD-10-CM | POA: Diagnosis not present

## 2015-09-15 DIAGNOSIS — Z809 Family history of malignant neoplasm, unspecified: Secondary | ICD-10-CM | POA: Diagnosis not present

## 2015-09-15 DIAGNOSIS — Z833 Family history of diabetes mellitus: Secondary | ICD-10-CM | POA: Insufficient documentation

## 2015-09-15 HISTORY — DX: Malignant neoplasm of esophagus, unspecified: C15.9

## 2015-09-15 HISTORY — DX: Pain in unspecified knee: M25.569

## 2015-09-15 NOTE — Progress Notes (Signed)
See progress note under physician encounter. 

## 2015-09-15 NOTE — Progress Notes (Signed)
.    HEMATOLOGY/ONCOLOGY CONSULTATION NOTE  Date of Service: 09/15/2015  Patient Care Team: Daniel Paterson, MD as PCP - General (Internal Medicine)  CHIEF COMPLAINTS/PURPOSE OF CONSULTATION:  Newly diagnosed Esophageal Squamous cell carcinoma   HISTORY OF PRESENTING ILLNESS:  Samuel Henderson is a wonderful 54 y.o. male who has been referred to us by Dr .PATERSON,DANIEL G, MD  for evaluation and management of newly diagnosed Esophageal Squamous cell carcinoma.  Patient has a h/o HTN, arthritis and presented to his PCP with 6-7 months of increasing dysphagia with an episode of a pork chop getting stuck in his throat. Patient subsequently had evaluation with a Barium swallow on 08/27/2015 for further evaluation which showed long segment narrowing in the mid thoracic esophagus worrisome for malignancy or inflammation.  Patient was subsequently referred to see GI (Dr Pyrtle) and had an EGD on 09/07/2015 which showed A circumferential ulcerated mass with friable surfaces was found beginning 30 cm from the incisors and extending to 37 cm. There is a 3 cm area of normal appearing squamous mucosa before the Z-line which is located at 40 cm. Multiple biopsies were performed using cold forceps. Above the mass there is mild dilation and esophagitis with more salmon-colored mucosa. There is a 2 cm hiatal hernia. The tumor causes luminal narrowing but is not currently obstructive.  Biopsy showed invasive SCC with LVI.   He subsequently had a CT chest/abd/pelvis on 09/09/2015 which showed mass like thickening in the mid esophagus with adjacent para-esophageal lymphadenopathy, gastrohepatic ligament lymphadenopathy, and 2 lesions in the liver, concerning for metastatic disease.  Patient was subsequently referred to Rad onc and medical oncology for what appears to be concerning for metastatic esophageal squamous cell carcinoma.  Patient notes dysphagia has been getting progressively worsening  solids>>liquids. Has had weight loss of 2- 3l bs. No nausea/vomiting/abdominal pain at this time.  MEDICAL HISTORY:  Past Medical History  Diagnosis Date  . Hypertension   . Arthritis   . Esophageal cancer (HCC)   . Knee pain     SURGICAL HISTORY: Past Surgical History  Procedure Laterality Date  . Knee surgery    . Lesion removal  1969    hip  . Esophagogastroduodenoscopy (egd) with esophageal dilation      and biopsy    SOCIAL HISTORY: Social History   Social History  . Marital Status: Single    Spouse Name: N/A  . Number of Children: N/A  . Years of Education: N/A   Occupational History  . Not on file.   Social History Main Topics  . Smoking status: Current Every Day Smoker -- 0.25 packs/day for 20 years    Types: Cigarettes  . Smokeless tobacco: Never Used  . Alcohol Use: No  . Drug Use: Yes    Special: Marijuana  . Sexual Activity: Yes   Other Topics Concern  . 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  FAMILY HISTORY: Family History  Problem Relation Age of Onset  . Colon cancer Neg Hx   . Esophageal cancer Father   . Cancer Father     nasopharyngeal   . Diabetes Sister   . Cancer Sister     multiple myeloma    ALLERGIES:  has No Known Allergies.  MEDICATIONS:  Current Outpatient Prescriptions  Medication Sig Dispense Refill  .   amLODipine (NORVASC) 5 MG tablet Take 5 mg by mouth daily.    [name] Kitchen lisinopril-hydrochlorothiazide (PRINZIDE,ZESTORETIC) 10-12.5 MG per tablet Take 1 tablet by mouth daily.    . meloxicam (MOBIC) 15 MG tablet Take 15 mg by mouth daily.    [name] Kitchen omeprazole (PRILOSEC) 20 MG capsule Take 1 capsule (20 mg total) by mouth daily. 30 capsule 2  . VIAGRA 100 MG tablet      No current facility-administered medications for this visit.    REVIEW OF SYSTEMS:    10 Point review of Systems was done is  negative except as noted above.  PHYSICAL EXAMINATION: ECOG PERFORMANCE STATUS: 1 - Symptomatic but completely ambulatory  . Filed Vitals:   09/15/15 1503  BP: 142/83  Pulse: 58  Temp: 98.3 F (36.8 C)  Resp: 18   Filed Weights   09/15/15 1503  Weight: 126 lb 12.8 oz (57.516 kg)   .Body mass index is 18.72 kg/(m^2).  GENERAL:middle aged AAM ,alert, in no acute distress and comfortable SKIN: skin color, texture, turgor are normal, no rashes or significant lesions EYES: normal, conjunctiva are pink and non-injected, sclera clear OROPHARYNX:no exudate, no erythema and lips, buccal mucosa, and tongue normal  NECK: supple, no JVD, thyroid normal size, non-tender, without nodularity LYMPH:  no palpable lymphadenopathy in the cervical, axillary or inguinal LUNGS: clear to auscultation with normal respiratory effort HEART: regular rate & rhythm,  no murmurs and no lower extremity edema ABDOMEN: abdomen soft, non-tender, normoactive bowel sounds  Musculoskeletal: no cyanosis of digits and no clubbing  PSYCH: alert & oriented x 3 with fluent speech NEURO: no focal motor/sensory deficits  LABORATORY DATA:  I have reviewed the data as listed  . CBC Latest Ref Rng 08/17/2015  WBC 4.0 - 10.5 K/uL 9.1  Hemoglobin 13.0 - 17.0 g/dL 12.2(L)  Hematocrit 39.0 - 52.0 % 37.1(L)  Platelets 150 - 400 K/uL 374    . CMP Latest Ref Rng 09/02/2015 08/17/2015  Glucose 70 - 99 mg/dL 86 102(H)  BUN 6 - 23 mg/dL 11 10  Creatinine 0.40 - 1.50 mg/dL 0.78 0.83  Sodium 135 - 145 mEq/L 139 138  Potassium 3.5 - 5.1 mEq/L 3.6 2.8(L)  Chloride 96 - 112 mEq/L 97 99(L)  CO2 19 - 32 mEq/L 33(H) 30  Calcium 8.4 - 10.5 mg/dL 9.8 9.3  Total Protein 6.0 - 8.3 g/dL 7.9 -  Total Bilirubin 0.2 - 1.2 mg/dL 0.5 -  Alkaline Phos 39 - 117 U/L 69 -  AST 0 - 37 U/L 23 -  ALT 0 - 53 U/L 14 -     RADIOGRAPHIC STUDIES: I have personally reviewed the radiological images as listed and agreed with the findings in  the report. Dg Chest 2 View  08/17/2015  CLINICAL DATA:  Chest pain and shortness of Breath EXAM: CHEST - 2 VIEW COMPARISON:  None. FINDINGS: Cardiac shadow is within normal limits. The lungs are well aerated bilaterally. Bilateral nipple shadows are noted. No acute bony abnormality is seen. No focal infiltrate is noted. IMPRESSION: No active disease. Electronically Signed   By: Inez Catalina M.D.   On: 08/17/2015 21:49   Ct Chest W Contrast  09/09/2015  CLINICAL DATA:  55 year old male with newly diagnosed proximal esophageal mass. Difficulty swallowing for the past 3 months. EXAM: CT CHEST, ABDOMEN, AND PELVIS WITH CONTRAST TECHNIQUE: Multidetector CT imaging of the chest, abdomen and pelvis was performed following the standard protocol during bolus administration of intravenous contrast. CONTRAST:  174m OMNIPAQUE IOHEXOL  300 MG/ML  SOLN COMPARISON:  No priors. FINDINGS: CT CHEST FINDINGS Mediastinum/Lymph Nodes: Heart size is normal. Small amount of pericardial fluid and/or thickening, unlikely to be of hemodynamic significance at this time. No associated pericardial calcification. There is atherosclerosis of the thoracic aorta, the great vessels of the mediastinum and the coronary arteries, including calcified atherosclerotic plaque in the left main, left anterior descending, left circumflex and right coronary arteries. Borderline enlarged enhancing 9 mm short axis paraesophageal lymph node (image 33 of series 2) highly suspicious given its proximity to the adjacent esophageal mass. Mass-like thickening throughout the midesophagus, compatible with the reported esophageal neoplasm. No axillary lymphadenopathy. Lungs/Pleura: No suspicious appearing pulmonary nodules or masses. No acute consolidative airspace disease. No pleural effusions. Musculoskeletal/Soft Tissues: There are no aggressive appearing lytic or blastic lesions noted in the visualized portions of the skeleton. CT ABDOMEN AND PELVIS FINDINGS  Hepatobiliary: 2 ill-defined hypovascular hepatic lesions measuring 1.8 x 2.2 cm in the central aspect of segment 8, and 2.1 x 1.5 cm in segment 4A (images 53 of series 2 and 57 of series 2 respectively) are highly suspicious for hepatic metastasis. No intra or extrahepatic biliary ductal dilatation. Gallbladder is normal in appearance. Pancreas: No pancreatic mass. No pancreatic ductal dilatation. No pancreatic or peripancreatic fluid or inflammatory changes. Spleen: 1.7 cm well circumscribed low to intermediate attenuation lesion in the spleen is incompletely characterize, but favored to represent a small. Adrenals/Urinary Tract: Bilateral adrenal glands bilateral kidneys are normal in appearance. No hydroureteronephrosis. Urinary bladder is normal in appearance. Stomach/Bowel: Normal appearance of the stomach. No pathologic dilatation of small bowel or colon. Normal appendix. Vascular/Lymphatic: Atherosclerosis throughout the abdominal and pelvic vasculature, without evidence of aneurysm or dissection. Soft tissue mass in the upper abdomen measuring 2.8 x 3.3 cm (image 59 of series 2) likely to represent enlarged gastrohepatic ligament lymph node mass. Reproductive: Prostate gland seminal vesicles are unremarkable in appearance. Other: No significant volume of ascites.  No pneumoperitoneum. Musculoskeletal: There are no aggressive appearing lytic or blastic lesions noted in the visualized portions of the skeleton. IMPRESSION: 1. Mass-like thickening in the mid esophagus with adjacent paraesophageal lymphadenopathy, gastrohepatic ligament lymphadenopathy, and 2 lesions in the liver, concerning for metastatic disease. 2. Atherosclerosis, including left main and 3 vessel coronary artery disease. Please note that although the presence of coronary artery calcium documents the presence of coronary artery disease, the severity of this disease and any potential stenosis cannot be assessed on this non-gated CT  examination. Assessment for potential risk factor modification, dietary therapy or pharmacologic therapy may be warranted, if clinically indicated. 3. Additional incidental findings, as above. Electronically Signed   By: Daniel  Entrikin M.D.   On: 09/09/2015 17:59   Ct Abdomen Pelvis W Contrast  09/09/2015  CLINICAL DATA:  54-year-old male with newly diagnosed proximal esophageal mass. Difficulty swallowing for the past 3 months. EXAM: CT CHEST, ABDOMEN, AND PELVIS WITH CONTRAST TECHNIQUE: Multidetector CT imaging of the chest, abdomen and pelvis was performed following the standard protocol during bolus administration of intravenous contrast. CONTRAST:  100mL OMNIPAQUE IOHEXOL 300 MG/ML  SOLN COMPARISON:  No priors. FINDINGS: CT CHEST FINDINGS Mediastinum/Lymph Nodes: Heart size is normal. Small amount of pericardial fluid and/or thickening, unlikely to be of hemodynamic significance at this time. No associated pericardial calcification. There is atherosclerosis of the thoracic aorta, the great vessels of the mediastinum and the coronary arteries, including calcified atherosclerotic plaque in the left main, left anterior descending, left circumflex and right coronary arteries. Borderline   enlarged enhancing 9 mm short axis paraesophageal lymph node (image 33 of series 2) highly suspicious given its proximity to the adjacent esophageal mass. Mass-like thickening throughout the midesophagus, compatible with the reported esophageal neoplasm. No axillary lymphadenopathy. Lungs/Pleura: No suspicious appearing pulmonary nodules or masses. No acute consolidative airspace disease. No pleural effusions. Musculoskeletal/Soft Tissues: There are no aggressive appearing lytic or blastic lesions noted in the visualized portions of the skeleton. CT ABDOMEN AND PELVIS FINDINGS Hepatobiliary: 2 ill-defined hypovascular hepatic lesions measuring 1.8 x 2.2 cm in the central aspect of segment 8, and 2.1 x 1.5 cm in segment 4A  (images 53 of series 2 and 57 of series 2 respectively) are highly suspicious for hepatic metastasis. No intra or extrahepatic biliary ductal dilatation. Gallbladder is normal in appearance. Pancreas: No pancreatic mass. No pancreatic ductal dilatation. No pancreatic or peripancreatic fluid or inflammatory changes. Spleen: 1.7 cm well circumscribed low to intermediate attenuation lesion in the spleen is incompletely characterize, but favored to represent a small. Adrenals/Urinary Tract: Bilateral adrenal glands bilateral kidneys are normal in appearance. No hydroureteronephrosis. Urinary bladder is normal in appearance. Stomach/Bowel: Normal appearance of the stomach. No pathologic dilatation of small bowel or colon. Normal appendix. Vascular/Lymphatic: Atherosclerosis throughout the abdominal and pelvic vasculature, without evidence of aneurysm or dissection. Soft tissue mass in the upper abdomen measuring 2.8 x 3.3 cm (image 59 of series 2) likely to represent enlarged gastrohepatic ligament lymph node mass. Reproductive: Prostate gland seminal vesicles are unremarkable in appearance. Other: No significant volume of ascites.  No pneumoperitoneum. Musculoskeletal: There are no aggressive appearing lytic or blastic lesions noted in the visualized portions of the skeleton. IMPRESSION: 1. Mass-like thickening in the mid esophagus with adjacent paraesophageal lymphadenopathy, gastrohepatic ligament lymphadenopathy, and 2 lesions in the liver, concerning for metastatic disease. 2. Atherosclerosis, including left main and 3 vessel coronary artery disease. Please note that although the presence of coronary artery calcium documents the presence of coronary artery disease, the severity of this disease and any potential stenosis cannot be assessed on this non-gated CT examination. Assessment for potential risk factor modification, dietary therapy or pharmacologic therapy may be warranted, if clinically indicated. 3.  Additional incidental findings, as above. Electronically Signed   By: Daniel  Entrikin M.D.   On: 09/09/2015 17:59   Dg Esophagus  08/27/2015  CLINICAL DATA:  Approximately 3 month history of sensation of food becoming stuck in the upper to mid esophageal region. No difficulty with water or other liquids. EXAM: ESOPHOGRAM / BARIUM SWALLOW / BARIUM TABLET STUDY TECHNIQUE: Combined double contrast and single contrast examination performed using effervescent crystals, thick barium liquid, and thin barium liquid. The patient was observed with fluoroscopy swallowing a 13 mm barium sulphate tablet. FLUOROSCOPY TIME:  Radiation Exposure Index (as provided by the fluoroscopic device): 43 dGy If the device does not provide the exposure index: Fluoroscopy Time:  1 minutes, 30 seconds Number of Acquired Images:  17 COMPARISON:  None in PACs FINDINGS: The anticipated procedure was discussed with Mr. Reish. He voiced his willingness to proceed. The cervical esophagus distended well. There was no laryngeal penetration of the barium. A tiny pulsion diverticulum was noted at the C7 level which filled and emptied promptly. In the mid esophagus there was long segment narrowing of the lumen with mucosal irregularity. This allowed passage of the liquid barium after mild delay. This would not allow passage of the 13 mm barium tablet despite ingestion of additional barium and water. The area of narrowing is approximately 5-6   cm in length. No reflux was observed.  There was no hiatal hernia. IMPRESSION: Long segment narrowing in the mid thoracic esophagus worrisome for malignancy or inflammation. Direct visualization is recommended. Elsewhere the esophagus exhibits no significant abnormality. These results will be called to the ordering clinician or representative by the Radiologist Assistant, and communication documented in the PACS or zVision Dashboard. Electronically Signed   By: David  Martinique M.D.   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 -would recommend US guided biopsy of liver mass since it has a signficant bearing on treatment. -if no distant mets could consider definitive concurrent chemoradiation. -if liver mets or other distant mets confirmed based on PET/CT and liver mass biopsy would likely recommend palliative esophgeal RT subsequently followed by palliative chemotherapy. -no pain currently -soft foods/liquids at this time.  RTC with Dr Irene Limbo in 2 weeks with PET/CT and liver mass biopsy  All of the patients questions were answered with apparent satisfaction. The patient knows to call the clinic with any problems, questions or concerns.  I spent 60 minutes counseling the patient face to face. The total time spent in the appointment was 65 minutes and more than 50% was on counseling and direct patient cares.    Sullivan Lone MD [place] AAHIVMS [facility] Hematology/Oncology Physician [facility]  (Office):       [phone] (Work cell):  [phone] (Fax):           [phone]  09/15/2015 2:09 PM

## 2015-09-15 NOTE — Progress Notes (Signed)
Radiation Oncology         [phone] ________________________________  Initial outpatient Consultation  Name: Samuel Henderson MRN: [id number]  Date: 09/15/2015  DOB: 28-Mar-1961  LF:[id number],CHENID Samuel Level, MD  Leanna Battles, MD   REFERRING PHYSICIAN: Leanna Battles, MD  DIAGNOSIS: The encounter diagnosis was Malignant neoplasm of lower third of esophagus ([place]).   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 Malignant neoplasm of lower third of esophagus ([facility]) 150.5 C15.5     HISTORY OF PRESENT ILLNESS::Samuel Henderson is a pleasant 55 y.o. male with a new history of an esophageal adenocarcinoma, seen at the request of Dr. Hilarie Fredrickson. The patient reports that in December 2016, he had an episode of dysphagia after eating meat which he had to forcefully regurgitate. Following this he had a syncopal episode and was seen in the emergency room. During his workup, he did not have a food impaction, and he was dispositioned to outpatient workup.   On 08/27/2015 he underwent a barium swallow which revealed a long segment of narrowing in the mid thoracic esophagus 5-6 cm in length, worrisome for possible tumor. He subsequently underwent EGD with Dr. Hilarie Fredrickson on 09/07/2015 revealing a 7 cm ulcerated circumferential tumor 30 cm from the incisors. Biopsies of this tumor revealed a poorly differentiated invasive squamous cell carcinoma with lymphovascular invasion identified. A CT scan of the chest abdomen and pelvis was subsequently performed on 02/27/2015 revealing mass like thickening to the mid esophagus consistent with patient's known tumor and a short axis paraesophageal lymph node measuring 9 mm. No lytic-appearing lesions were identified within the skeletal system. 2 ill-defined hypovascular hepatic lesions measuring 1.8 x 2.2 cm, and a 2.1 x 1.5 cm were identified concerning for metastatic disease. Within the spleen a 1.7 cm  well-circumscribed low to indeterminate attenuation lesion was identified. Soft tissue mass was also identified in the upper abdomen measuring 2.8 x3.3 cm along the region of the gastrohepatic ligament.    PREVIOUS RADIATION THERAPY: No  PAST MEDICAL HISTORY:  has a past medical history of Hypertension; Arthritis; Esophageal cancer ([place]); and Knee pain.    PAST SURGICAL HISTORY: Past Surgical History  Procedure Laterality Date  . Knee surgery    . Lesion removal  1969    hip  . Esophagogastroduodenoscopy (egd) with esophageal dilation      and biopsy    FAMILY HISTORY: family history includes Cancer in his father and sister; Diabetes in his sister; Esophageal cancer in his father. There is no history of Colon cancer.  SOCIAL HISTORY:  Social History   Social History  . Marital Status: Single    Spouse Name: N/A  . Number of Children: N/A  . Years of Education: N/A   Occupational History  . Not on file.   Social History Main Topics  . Smoking status: Current Every Day Smoker -- 0.25 packs/day for 20 years    Types: Cigarettes  . Smokeless tobacco: Never Used  . Alcohol Use: No  . Drug Use: Yes    Special: Marijuana  . Sexual Activity: Yes   Other Topics Concern  . Not on file   Social History Narrative    ALLERGIES: Review of patient's allergies indicates no known allergies.  MEDICATIONS:  Current Outpatient Prescriptions  Medication Sig Dispense Refill  . amLODipine (NORVASC) 5 MG tablet Take 5 mg by mouth daily.    [name] Kitchen lisinopril-hydrochlorothiazide (PRINZIDE,ZESTORETIC) 10-12.5 MG per  tablet Take 1 tablet by mouth daily.    . meloxicam (MOBIC) 15 MG tablet Take 15 mg by mouth daily.    [name] Kitchen omeprazole (PRILOSEC) 20 MG capsule Take 1 capsule (20 mg total) by mouth daily. 30 capsule 2  . VIAGRA 100 MG tablet      No current facility-administered medications for this encounter.    REVIEW OF SYSTEMS:  A 15 point review of systems is documented in the electronic  medical record. This was obtained by the nursing staff. However, I reviewed this with the patient to discuss relevant findings and make appropriate changes.  Pertinent items are noted in HPI.   Dysphagia has worsened over the past 6 months. Denies significant weight loss. Patient has not see a dentist in 6-7 years. Current everyday smoker. Patient continues to work. Reports history of night sweats but, none recently. Sister has multiple myeloma. Father had nasopharyngeal cancer.   PHYSICAL EXAM:  height is 5' 9"  (1.753 m) and weight is 127 lb 12.8 oz (57.97 kg). His blood pressure is 148/102 and his pulse is 48. His respiration is 16 and oxygen saturation is 100%.   per GI well-developed thin older African-American male in no acute distress, pleasant blood pressure 122/76 pulse 88 height 5 foot 9 weight 127. HEENT; nontraumatic normocephalic EOMI PERRLA sclera anicteric, Cardiovascular; regular rate and rhythm with S1-S2 no murmur or gallop, Pulmonary; clear bilaterally, Abdomen ;soft nondistended nontender no palpable mass or hepatosplenomegaly bowel sounds are present, Rectal; exam not done, Extremities; no clubbing cyanosis or edema skin warm and dry, Neuropsych ;mood and affect appropriate  KPS = 80  100 - Normal; no complaints; no evidence of disease. 90   - Able to carry on normal activity; minor signs or symptoms of disease. 80   - Normal activity with effort; some signs or symptoms of disease. 18   - Cares for self; unable to carry on normal activity or to do active work. 60   - Requires occasional assistance, but is able to care for most of his personal needs. 50   - Requires considerable assistance and frequent medical care. 60   - Disabled; requires special care and assistance. 62   - Severely disabled; hospital admission is indicated although death not imminent. 59   - Very sick; hospital admission necessary; active supportive treatment necessary. 10   - Moribund; fatal processes  progressing rapidly. 0     - Dead  Karnofsky DA, Abelmann Cavalier, Craver LS and [facility] [phone]) The use of the nitrogen mustards in the palliative treatment of carcinoma: with particular reference to bronchogenic carcinoma Cancer 1 [id number]  LABORATORY DATA:  Lab Results  Component Value Date   WBC 9.1 08/17/2015   HGB 12.2* 08/17/2015   HCT 37.1* 08/17/2015   MCV 94.4 08/17/2015   PLT 374 08/17/2015   Lab Results  Component Value Date   NA 139 09/02/2015   K 3.6 09/02/2015   CL 97 09/02/2015   CO2 33* 09/02/2015   Lab Results  Component Value Date   ALT 14 09/02/2015   AST 23 09/02/2015   ALKPHOS 69 09/02/2015   BILITOT 0.5 09/02/2015     RADIOGRAPHY: Dg Chest 2 View  08/17/2015  CLINICAL DATA:  Chest pain and shortness of Breath EXAM: CHEST - 2 VIEW COMPARISON:  None. FINDINGS: Cardiac shadow is within normal limits. The lungs are well aerated bilaterally. Bilateral nipple shadows are noted. No acute bony abnormality is seen. No focal infiltrate is noted.  IMPRESSION: No active disease. Electronically Signed   By: Inez Catalina M.D.   On: 08/17/2015 21:49   Ct Chest W Contrast  09/09/2015  CLINICAL DATA:  55 year old male with newly diagnosed proximal esophageal mass. Difficulty swallowing for the past 3 months. EXAM: CT CHEST, ABDOMEN, AND PELVIS WITH CONTRAST TECHNIQUE: Multidetector CT imaging of the chest, abdomen and pelvis was performed following the standard protocol during bolus administration of intravenous contrast. CONTRAST:  155m OMNIPAQUE IOHEXOL 300 MG/ML  SOLN COMPARISON:  No priors. FINDINGS: CT CHEST FINDINGS Mediastinum/Lymph Nodes: Heart size is normal. Small amount of pericardial fluid and/or thickening, unlikely to be of hemodynamic significance at this time. No associated pericardial calcification. There is atherosclerosis of the thoracic aorta, the great vessels of the mediastinum and the coronary arteries, including calcified atherosclerotic plaque in the left  main, left anterior descending, left circumflex and right coronary arteries. Borderline enlarged enhancing 9 mm short axis paraesophageal lymph node (image 33 of series 2) highly suspicious given its proximity to the adjacent esophageal mass. Mass-like thickening throughout the midesophagus, compatible with the reported esophageal neoplasm. No axillary lymphadenopathy. Lungs/Pleura: No suspicious appearing pulmonary nodules or masses. No acute consolidative airspace disease. No pleural effusions. Musculoskeletal/Soft Tissues: There are no aggressive appearing lytic or blastic lesions noted in the visualized portions of the skeleton. CT ABDOMEN AND PELVIS FINDINGS Hepatobiliary: 2 ill-defined hypovascular hepatic lesions measuring 1.8 x 2.2 cm in the central aspect of segment 8, and 2.1 x 1.5 cm in segment 4A (images 53 of series 2 and 57 of series 2 respectively) are highly suspicious for hepatic metastasis. No intra or extrahepatic biliary ductal dilatation. Gallbladder is normal in appearance. Pancreas: No pancreatic mass. No pancreatic ductal dilatation. No pancreatic or peripancreatic fluid or inflammatory changes. Spleen: 1.7 cm well circumscribed low to intermediate attenuation lesion in the spleen is incompletely characterize, but favored to represent a small. Adrenals/Urinary Tract: Bilateral adrenal glands bilateral kidneys are normal in appearance. No hydroureteronephrosis. Urinary bladder is normal in appearance. Stomach/Bowel: Normal appearance of the stomach. No pathologic dilatation of small bowel or colon. Normal appendix. Vascular/Lymphatic: Atherosclerosis throughout the abdominal and pelvic vasculature, without evidence of aneurysm or dissection. Soft tissue mass in the upper abdomen measuring 2.8 x 3.3 cm (image 59 of series 2) likely to represent enlarged gastrohepatic ligament lymph node mass. Reproductive: Prostate gland seminal vesicles are unremarkable in appearance. Other: No significant  volume of ascites.  No pneumoperitoneum. Musculoskeletal: There are no aggressive appearing lytic or blastic lesions noted in the visualized portions of the skeleton. IMPRESSION: 1. Mass-like thickening in the mid esophagus with adjacent paraesophageal lymphadenopathy, gastrohepatic ligament lymphadenopathy, and 2 lesions in the liver, concerning for metastatic disease. 2. Atherosclerosis, including left main and 3 vessel coronary artery disease. Please note that although the presence of coronary artery calcium documents the presence of coronary artery disease, the severity of this disease and any potential stenosis cannot be assessed on this non-gated CT examination. Assessment for potential risk factor modification, dietary therapy or pharmacologic therapy may be warranted, if clinically indicated. 3. Additional incidental findings, as above. Electronically Signed   By: DVinnie LangtonM.D.   On: 09/09/2015 17:59   Ct Abdomen Pelvis W Contrast  09/09/2015  CLINICAL DATA:  55year old male with newly diagnosed proximal esophageal mass. Difficulty swallowing for the past 3 months. EXAM: CT CHEST, ABDOMEN, AND PELVIS WITH CONTRAST TECHNIQUE: Multidetector CT imaging of the chest, abdomen and pelvis was performed following the standard protocol during bolus administration of  intravenous contrast. CONTRAST:  130m OMNIPAQUE IOHEXOL 300 MG/ML  SOLN COMPARISON:  No priors. FINDINGS: CT CHEST FINDINGS Mediastinum/Lymph Nodes: Heart size is normal. Small amount of pericardial fluid and/or thickening, unlikely to be of hemodynamic significance at this time. No associated pericardial calcification. There is atherosclerosis of the thoracic aorta, the great vessels of the mediastinum and the coronary arteries, including calcified atherosclerotic plaque in the left main, left anterior descending, left circumflex and right coronary arteries. Borderline enlarged enhancing 9 mm short axis paraesophageal lymph node (image 33 of  series 2) highly suspicious given its proximity to the adjacent esophageal mass. Mass-like thickening throughout the midesophagus, compatible with the reported esophageal neoplasm. No axillary lymphadenopathy. Lungs/Pleura: No suspicious appearing pulmonary nodules or masses. No acute consolidative airspace disease. No pleural effusions. Musculoskeletal/Soft Tissues: There are no aggressive appearing lytic or blastic lesions noted in the visualized portions of the skeleton. CT ABDOMEN AND PELVIS FINDINGS Hepatobiliary: 2 ill-defined hypovascular hepatic lesions measuring 1.8 x 2.2 cm in the central aspect of segment 8, and 2.1 x 1.5 cm in segment 4A (images 53 of series 2 and 57 of series 2 respectively) are highly suspicious for hepatic metastasis. No intra or extrahepatic biliary ductal dilatation. Gallbladder is normal in appearance. Pancreas: No pancreatic mass. No pancreatic ductal dilatation. No pancreatic or peripancreatic fluid or inflammatory changes. Spleen: 1.7 cm well circumscribed low to intermediate attenuation lesion in the spleen is incompletely characterize, but favored to represent a small. Adrenals/Urinary Tract: Bilateral adrenal glands bilateral kidneys are normal in appearance. No hydroureteronephrosis. Urinary bladder is normal in appearance. Stomach/Bowel: Normal appearance of the stomach. No pathologic dilatation of small bowel or colon. Normal appendix. Vascular/Lymphatic: Atherosclerosis throughout the abdominal and pelvic vasculature, without evidence of aneurysm or dissection. Soft tissue mass in the upper abdomen measuring 2.8 x 3.3 cm (image 59 of series 2) likely to represent enlarged gastrohepatic ligament lymph node mass. Reproductive: Prostate gland seminal vesicles are unremarkable in appearance. Other: No significant volume of ascites.  No pneumoperitoneum. Musculoskeletal: There are no aggressive appearing lytic or blastic lesions noted in the visualized portions of the  skeleton. IMPRESSION: 1. Mass-like thickening in the mid esophagus with adjacent paraesophageal lymphadenopathy, gastrohepatic ligament lymphadenopathy, and 2 lesions in the liver, concerning for metastatic disease. 2. Atherosclerosis, including left main and 3 vessel coronary artery disease. Please note that although the presence of coronary artery calcium documents the presence of coronary artery disease, the severity of this disease and any potential stenosis cannot be assessed on this non-gated CT examination. Assessment for potential risk factor modification, dietary therapy or pharmacologic therapy may be warranted, if clinically indicated. 3. Additional incidental findings, as above. Electronically Signed   By: DVinnie LangtonM.D.   On: 09/09/2015 17:59   Dg Esophagus  08/27/2015  CLINICAL DATA:  Approximately 3 month history of sensation of food becoming stuck in the upper to mid esophageal region. No difficulty with water or other liquids. EXAM: ESOPHOGRAM / BARIUM SWALLOW / BARIUM TABLET STUDY TECHNIQUE: Combined double contrast and single contrast examination performed using effervescent crystals, thick barium liquid, and thin barium liquid. The patient was observed with fluoroscopy swallowing a 13 mm barium sulphate tablet. FLUOROSCOPY TIME:  Radiation Exposure Index (as provided by the fluoroscopic device): 43 dGy If the device does not provide the exposure index: Fluoroscopy Time:  1 minutes, 30 seconds Number of Acquired Images:  17 COMPARISON:  None in PACs FINDINGS: The anticipated procedure was discussed with Mr. MLuhmann He voiced his willingness  to proceed. The cervical esophagus distended well. There was no laryngeal penetration of the barium. A tiny pulsion diverticulum was noted at the C7 Henderson which filled and emptied promptly. In the mid esophagus there was long segment narrowing of the lumen with mucosal irregularity. This allowed passage of the liquid barium after mild delay. This  would not allow passage of the 13 mm barium tablet despite ingestion of additional barium and water. The area of narrowing is approximately 5-6 cm in length. No reflux was observed.  There was no hiatal hernia. IMPRESSION: Long segment narrowing in the mid thoracic esophagus worrisome for malignancy or inflammation. Direct visualization is recommended. Elsewhere the esophagus exhibits no significant abnormality. These results will be called to the ordering clinician or representative by the Radiologist Assistant, and communication documented in the PACS or zVision Dashboard. Electronically Signed   By: David  Martinique M.D.   On: 08/27/2015 11:59      IMPRESSION: Mr. 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 He may benefit from PET for staging. The patient would likely be a good candidate for radiation regardless of PET findings. Namely, if he has metastatic disease he may be a good candidate for palliative radiation for 2 weeks to palliate dysphagia. If PET scan fails to confirm metastatic disease, patient may be a good candidate for definitive chemo-radiotherapy, and need EUS prior to starting.   PLAN: Today, I talked to the patient and family about the findings and work-up thus far.  We discussed the natural history of lower esophageal squamous cell carcinoma and general treatment, highlighting the role of radiotherapy in the management.  We discussed the available radiation techniques, and focused on the details of logistics and delivery.  We reviewed the anticipated acute and late sequelae associated with radiation in this setting.  The patient was encouraged to ask questions that I answered to the best of my ability.  The patient would like to proceed with radiation and has been scheduled for CT simulation on Friday, 1/20, at 3 pm.  The patient is scheduled for consultation with medical oncology, Dr. Irene Limbo, later today. If Dr. Irene Limbo agrees, he may  undergo a PET scan for further staging.   I spent 60 minutes minutes face to face with the patient and more than 50% of that time was spent in counseling and/or coordination of care.  ------------------------------------------------  Sheral Apley. Tammi Klippel, M.D.   This document serves as a record of services personally performed by [name], MD. It was created on his behalf by Arlyce Harman, a trained medical scribe. The creation of this record is based on the scribe's personal observations and the provider's statements to them. This document has been checked and approved by the attending provider.

## 2015-09-15 NOTE — Telephone Encounter (Signed)
Called and left a message with  Lab and nutrition appointment

## 2015-09-15 NOTE — Progress Notes (Signed)
Oncology Nurse Navigator Documentation  Oncology Nurse Navigator Flowsheets 09/15/2015  Navigator Location [facility]  Navigator Encounter Type Initial MedOnc  Abnormal Finding Date 08/27/2015  Confirmed Diagnosis Date 09/07/2015  Patient Visit Type MedOnc  Treatment Phase Treatment  Barriers/Navigation Needs Education  Education Understanding Cancer/ Treatment Options;Preparing for Upcoming Treatment;Newly Diagnosed Cancer Education  Interventions Referrals;Education Method  Referrals Social Work;Nutrition/dietician  Education Method Verbal;Written;Teach-back  Support Groups/Services GI Support Group;[place] and transportation  Acuity Level 2  Time Spent with Patient 68  Met with patient, his mother and brother during new patient visit. Explained the role of the GI Nurse Navigator and provided New Patient Packet with information on: 1. Esophageal cancer 2. Support groups 3. Advanced Directives 4. Fall Safety Plan Answered questions, reviewed current treatment plan using TEACH back and provided emotional support. Provided samples of Boost, Ensure and Ensure Enlive and coupons to try to improve his nutrition with instructions to drink at least 2/day in addition to meals. Samuel Henderson is single, with two grown children. He is still employed full time and drives. He has a strong faith base and verbalizes he is at ease with his diagnosis and trusts God to work in his life and guide his care team. Reports he was very ill years ago and says "I could have died long ago". He is interested in transportation assistance if he becomes too weak to drive to treatments.  Merceda Elks, RN, BSN GI Oncology [place]

## 2015-09-21 ENCOUNTER — Telehealth: Payer: Self-pay | Admitting: *Deleted

## 2015-09-21 NOTE — Telephone Encounter (Signed)
Oncology Nurse Navigator Documentation  Oncology Nurse Navigator Flowsheets 09/21/2015  Navigator Location [facility]  Navigator Encounter Type Telephone  Telephone Outgoing Call;Appt Confirmation/Clarification  Abnormal Finding Date -  Confirmed Diagnosis Date -  Patient Visit Type -  Treatment Phase -  Barriers/Navigation Needs Coordination of Care  Education -  Interventions Coordination of Care  Referrals -  Coordination of Care Appts--scheduled f/u with Dr. Irene Limbo for 09/30/15 at 3:30 so he won't miss much work.  Education Method -  Support Groups/Services -  Acuity Level 2  Time Spent with Patient -15  Provided his mom the appointment to see Dr. Irene Limbo after his PET scan. Reviewed his week of appointments with her and PET scan preparation.

## 2015-09-23 ENCOUNTER — Other Ambulatory Visit: Payer: Self-pay | Admitting: Radiology

## 2015-09-24 ENCOUNTER — Ambulatory Visit (HOSPITAL_COMMUNITY): Admission: RE | Admit: 2015-09-24 | Payer: 59 | Source: Ambulatory Visit

## 2015-09-24 ENCOUNTER — Ambulatory Visit (HOSPITAL_COMMUNITY)
Admission: RE | Admit: 2015-09-24 | Discharge: 2015-09-24 | Disposition: A | Discharge status: Home / Self Care | Payer: 59 | Source: Ambulatory Visit | Attending: Hematology | Admitting: Hematology

## 2015-09-24 ENCOUNTER — Ambulatory Visit: Admission: RE | Admit: 2015-09-24 | Payer: 59 | Source: Ambulatory Visit | Admitting: Radiation Oncology

## 2015-09-24 ENCOUNTER — Encounter (HOSPITAL_COMMUNITY): Payer: Self-pay

## 2015-09-24 ENCOUNTER — Encounter: Payer: Self-pay | Admitting: *Deleted

## 2015-09-24 DIAGNOSIS — F1990 Other psychoactive substance use, unspecified, uncomplicated: Secondary | ICD-10-CM | POA: Diagnosis not present

## 2015-09-24 DIAGNOSIS — K769 Liver disease, unspecified: Secondary | ICD-10-CM | POA: Insufficient documentation

## 2015-09-24 DIAGNOSIS — C159 Malignant neoplasm of esophagus, unspecified: Secondary | ICD-10-CM

## 2015-09-24 DIAGNOSIS — I10 Essential (primary) hypertension: Secondary | ICD-10-CM | POA: Diagnosis not present

## 2015-09-24 DIAGNOSIS — M1990 Unspecified osteoarthritis, unspecified site: Secondary | ICD-10-CM | POA: Diagnosis not present

## 2015-09-24 DIAGNOSIS — M25569 Pain in unspecified knee: Secondary | ICD-10-CM | POA: Insufficient documentation

## 2015-09-24 DIAGNOSIS — I2510 Atherosclerotic heart disease of native coronary artery without angina pectoris: Secondary | ICD-10-CM | POA: Diagnosis not present

## 2015-09-24 DIAGNOSIS — F17210 Nicotine dependence, cigarettes, uncomplicated: Secondary | ICD-10-CM | POA: Diagnosis not present

## 2015-09-24 DIAGNOSIS — Z01812 Encounter for preprocedural laboratory examination: Secondary | ICD-10-CM | POA: Diagnosis present

## 2015-09-24 LAB — CBC WITH DIFFERENTIAL/PLATELET
Basophils Absolute: 0 10*3/uL (ref 0.0–0.1)
Basophils Relative: 0 %
EOS ABS: 0.2 10*3/uL (ref 0.0–0.7)
EOS PCT: 2 %
HCT: 38.7 % — ABNORMAL LOW (ref 39.0–52.0)
Hemoglobin: 12.4 g/dL — ABNORMAL LOW (ref 13.0–17.0)
LYMPHS ABS: 2.6 10*3/uL (ref 0.7–4.0)
LYMPHS PCT: 37 %
MCH: 30.2 pg (ref 26.0–34.0)
MCHC: 32 g/dL (ref 30.0–36.0)
MCV: 94.4 fL (ref 78.0–100.0)
MONO ABS: 0.5 10*3/uL (ref 0.1–1.0)
Monocytes Relative: 7 %
Neutro Abs: 3.8 10*3/uL (ref 1.7–7.7)
Neutrophils Relative %: 54 %
Platelets: 395 10*3/uL (ref 150–400)
RBC: 4.1 MIL/uL — AB (ref 4.22–5.81)
RDW: 15.3 % (ref 11.5–15.5)
WBC: 7.1 10*3/uL (ref 4.0–10.5)

## 2015-09-24 LAB — COMPREHENSIVE METABOLIC PANEL
ALK PHOS: 62 U/L (ref 38–126)
ALT: 14 U/L — AB (ref 17–63)
ANION GAP: 12 (ref 5–15)
AST: 25 U/L (ref 15–41)
Albumin: 4.1 g/dL (ref 3.5–5.0)
BILIRUBIN TOTAL: 0.8 mg/dL (ref 0.3–1.2)
BUN: 11 mg/dL (ref 6–20)
CALCIUM: 9.9 mg/dL (ref 8.9–10.3)
CO2: 28 mmol/L (ref 22–32)
CREATININE: 0.83 mg/dL (ref 0.61–1.24)
Chloride: 100 mmol/L — ABNORMAL LOW (ref 101–111)
Glucose, Bld: 94 mg/dL (ref 65–99)
Potassium: 3.7 mmol/L (ref 3.5–5.1)
SODIUM: 140 mmol/L (ref 135–145)
TOTAL PROTEIN: 7.9 g/dL (ref 6.5–8.1)

## 2015-09-24 LAB — PROTIME-INR
INR: 1 (ref 0.00–1.49)
PROTHROMBIN TIME: 13.4 s (ref 11.6–15.2)

## 2015-09-24 MED ORDER — MIDAZOLAM HCL 2 MG/2ML IJ SOLN
INTRAMUSCULAR | Status: AC | PRN
  Administered 2015-09-24: 1 mg via INTRAVENOUS

## 2015-09-24 MED ORDER — HYDROCODONE-ACETAMINOPHEN 5-325 MG PO TABS: 1.0000 | ORAL_TABLET | ORAL | Status: DC | PRN

## 2015-09-24 MED ORDER — FENTANYL CITRATE (PF) 100 MCG/2ML IJ SOLN
INTRAMUSCULAR | Status: AC | PRN
  Administered 2015-09-24: 50 ug via INTRAVENOUS

## 2015-09-24 MED ORDER — SODIUM CHLORIDE 0.9 % IV SOLN
INTRAVENOUS | Status: DC
  Administered 2015-09-24: 11:00:00 via INTRAVENOUS

## 2015-09-24 MED ORDER — FENTANYL CITRATE (PF) 100 MCG/2ML IJ SOLN
INTRAMUSCULAR | Status: DC
Start: 2015-09-24 — End: 2015-09-25
  Filled 2015-09-24: qty 4

## 2015-09-24 MED ORDER — MIDAZOLAM HCL 2 MG/2ML IJ SOLN
INTRAMUSCULAR | Status: DC
Start: 2015-09-24 — End: 2015-09-25
  Filled 2015-09-24: qty 4

## 2015-09-24 NOTE — Procedures (Signed)
US biopsy liver lesion 18g x3 to surg path No complication No blood loss. See complete dictation in [facility].

## 2015-09-24 NOTE — Discharge Instructions (Signed)
Call [facility] at [phone] for any problems or questions. Leave bandaid on and in place for 24 hours them may remove and bathe as usual. No change in medication or die.t Quiet rest remainder of day   Moderate Conscious Sedation, Adult, Care After Refer to this sheet in the next few weeks. These instructions provide you with information on caring for yourself after your procedure. Your health care provider may also give you more specific instructions. Your treatment has been planned according to current medical practices, but problems sometimes occur. Call your health care provider if you have any problems or questions after your procedure. WHAT TO EXPECT AFTER THE PROCEDURE  After your procedure:  You may feel sleepy, clumsy, and have poor balance for several hours.  Vomiting may occur if you eat too soon after the procedure. HOME CARE INSTRUCTIONS  Do not participate in any activities where you could become injured for at least 24 hours. Do not:  Drive.  Swim.  Ride a bicycle.  Operate heavy machinery.  Cook.  Use power tools.  Climb ladders.  Work from a high place.  Do not make important decisions or sign legal documents until you are improved.  If you vomit, drink water, juice, or soup when you can drink without vomiting. Make sure you have little or no nausea before eating solid foods.  Only take over-the-counter or prescription medicines for pain, discomfort, or fever as directed by your health care provider.  Make sure you and your family fully understand everything about the medicines given to you, including what side effects may occur.  You should not drink alcohol, take sleeping pills, or take medicines that cause drowsiness for at least 24 hours.  If you smoke, do not smoke without supervision.  If you are feeling better, you may resume normal activities 24 hours after you were sedated.  Keep all appointments with your health care  provider. SEEK MEDICAL CARE IF:  Your skin is pale or bluish in color.  You continue to feel nauseous or vomit.  Your pain is getting worse and is not helped by medicine.  You have bleeding or swelling.  You are still sleepy or feeling clumsy after 24 hours. SEEK IMMEDIATE MEDICAL CARE IF:  You develop a rash.  You have difficulty breathing.  You develop any type of allergic problem.  You have a fever. MAKE SURE YOU:  Understand these instructions.  Will watch your condition.  Will get help right away if you are not doing well or get worse.   This information is not intended to replace advice given to you by your health care provider. Make sure you discuss any questions you have with your health care provider.   Document Released: 06/11/2013 Document Revised: 09/11/2014 Document Reviewed: 06/11/2013 Elsevier Interactive Patient Education 2016 [employer] Liver Biopsy, Care After These instructions give you information on caring for yourself after your procedure. Your doctor may also give you more specific instructions. Call your doctor if you have any problems or questions after your procedure. HOME CARE  Rest at home for 1-2 days or as told by your doctor.  Have someone stay with you for at least 24 hours.  Do not do these things in the first 24 hours:  Drive.  Use machinery.  Take care of other people.  Sign legal documents.  Take a bath or shower.  There are many different ways to close and cover a cut (incision). For example, a cut can be closed  with stitches, skin glue, or adhesive strips. Follow your doctor's instructions on:  Taking care of your cut.  Changing and removing your bandage (dressing).  Removing whatever was used to close your cut.  Do not drink alcohol in the first week.  Do not lift more than 5 pounds or play contact sports for the first 2 weeks.  Take medicines only as told by your doctor. For 1 week, do not take medicine that  has aspirin in it or medicines like ibuprofen.  Get your test results. GET HELP IF:  A cut bleeds and leaves more than just a small spot of blood.  A cut is red, puffs up (swells), or hurts more than before.  Fluid or something else comes from a cut.  A cut smells bad.  You have a fever or chills. GET HELP RIGHT AWAY IF:  You have swelling, bloating, or pain in your belly (abdomen).  You get dizzy or faint.  You have a rash.  You feel sick to your stomach (nauseous) or throw up (vomit).  You have trouble breathing, feel short of breath, or feel faint.  Your chest hurts.  You have problems talking or seeing.  You have trouble balancing or moving your arms or legs.   This information is not intended to replace advice given to you by your health care provider. Make sure you discuss any questions you have with your health care provider.   Document Released: 05/30/2008 Document Revised: 09/11/2014 Document Reviewed: 10/17/2013 Elsevier Interactive Patient Education [employer].

## 2015-09-24 NOTE — Progress Notes (Signed)
  Oncology Nurse Navigator Documentation  Navigator Location: [facility] (09/24/15 1130)   Telephone: Appt Confirmation/Clarification (09/24/15 1130): Brought revised appointment calendar to patient in short stay as he was waiting for his biopsy. Explained that his 1/26 OV was moved to 1/25 after his PET scan so he won't have to miss work on 1/26. He expressed appreciation for the consideration.

## 2015-09-24 NOTE — H&P (Signed)
Chief Complaint: Patient was seen in consultation today for liver lesion biopsy at the request of Brunetta Genera  Referring Physician(s): Brunetta Genera  History of Present Illness: Samuel Henderson is a 55 y.o. male with esophageal cancer who is found to have some liver lesions suspicious for mets. He is referred for US guided biopsy. PMHx, meds, labs, allergies, imaging reviewed. Has been NPO this am NO acute complaints, feels well.  Past Medical History  Diagnosis Date  . Hypertension   . Arthritis   . Esophageal cancer ([place])   . Knee pain     Past Surgical History  Procedure Laterality Date  . Knee surgery    . Lesion removal  1969    hip  . Esophagogastroduodenoscopy (egd) with esophageal dilation      and biopsy    Allergies: Review of patient's allergies indicates no known allergies.  Medications: Prior to Admission medications   Medication Sig Start Date End Date Taking? Authorizing Provider  amLODipine (NORVASC) 5 MG tablet Take 5 mg by mouth daily.   Yes Historical Provider, MD  lisinopril-hydrochlorothiazide (PRINZIDE,ZESTORETIC) 10-12.5 MG per tablet Take 1 tablet by mouth daily.   Yes Historical Provider, MD  meloxicam (MOBIC) 15 MG tablet Take 15 mg by mouth daily.   Yes Historical Provider, MD  omeprazole (PRILOSEC) 20 MG capsule Take 1 capsule (20 mg total) by mouth daily. 09/02/15  Yes Amy S Esterwood, PA-C  VIAGRA 100 MG tablet  09/10/15   Historical Provider, MD     Family History  Problem Relation Age of Onset  . Colon cancer Neg Hx   . Esophageal cancer Father   . Cancer Father     nasopharyngeal   . Diabetes Sister   . Cancer Sister     multiple myeloma    Social History   Social History  . Marital Status: Single    Spouse Name: N/A  . Number of Children: 2  . Years of Education: N/A   Occupational History  . Manufacturing industrial curtains    Social History Main Topics  . Smoking status: Current Every Day Smoker  -- 0.25 packs/day for 20 years    Types: Cigarettes  . Smokeless tobacco: Never Used  . Alcohol Use: No  . Drug Use: Yes    Special: Marijuana     Comment: last smoked 1 week ago  . Sexual Activity: Yes   Other Topics Concern  . 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  All other systems are negative.  Review of Systems  Vital Signs: There were no vitals taken for this visit.  Physical Exam  Constitutional: He is oriented to person, place, and time. He appears well-developed and well-nourished. No distress.  HENT:  Head: Normocephalic.  Mouth/Throat: Oropharynx is clear and moist.  Neck: Normal range of motion. No tracheal deviation present.  Cardiovascular: Normal rate, regular rhythm and normal heart sounds.   Pulmonary/Chest: Effort normal and breath sounds normal. No respiratory distress.  Abdominal: Soft. He exhibits no distension. There is no tenderness.  Neurological: He is alert and oriented to person, place, and time.  Skin: Skin is warm and  dry.  Psychiatric: He has a normal mood and affect. Judgment normal.    Mallampati Score:  MD Evaluation Airway: WNL Heart: WNL Abdomen: WNL Chest/ Lungs: WNL ASA  Classification: 2 Mallampati/Airway Score: One  Imaging: Ct Chest W Contrast  09/09/2015  CLINICAL DATA:  55 year old male with newly diagnosed proximal esophageal mass. Difficulty swallowing for the past 3 months. EXAM: CT CHEST, ABDOMEN, AND PELVIS WITH CONTRAST TECHNIQUE: Multidetector CT imaging of the chest, abdomen and pelvis was performed following the standard protocol during bolus administration of intravenous contrast. CONTRAST:  130m OMNIPAQUE IOHEXOL 300 MG/ML  SOLN  COMPARISON:  No priors. FINDINGS: CT CHEST FINDINGS Mediastinum/Lymph Nodes: Heart size is normal. Small amount of pericardial fluid and/or thickening, unlikely to be of hemodynamic significance at this time. No associated pericardial calcification. There is atherosclerosis of the thoracic aorta, the great vessels of the mediastinum and the coronary arteries, including calcified atherosclerotic plaque in the left main, left anterior descending, left circumflex and right coronary arteries. Borderline enlarged enhancing 9 mm short axis paraesophageal lymph node (image 33 of series 2) highly suspicious given its proximity to the adjacent esophageal mass. Mass-like thickening throughout the midesophagus, compatible with the reported esophageal neoplasm. No axillary lymphadenopathy. Lungs/Pleura: No suspicious appearing pulmonary nodules or masses. No acute consolidative airspace disease. No pleural effusions. Musculoskeletal/Soft Tissues: There are no aggressive appearing lytic or blastic lesions noted in the visualized portions of the skeleton. CT ABDOMEN AND PELVIS FINDINGS Hepatobiliary: 2 ill-defined hypovascular hepatic lesions measuring 1.8 x 2.2 cm in the central aspect of segment 8, and 2.1 x 1.5 cm in segment 4A (images 53 of series 2 and 57 of series 2 respectively) are highly suspicious for hepatic metastasis. No intra or extrahepatic biliary ductal dilatation. Gallbladder is normal in appearance. Pancreas: No pancreatic mass. No pancreatic ductal dilatation. No pancreatic or peripancreatic fluid or inflammatory changes. Spleen: 1.7 cm well circumscribed low to intermediate attenuation lesion in the spleen is incompletely characterize, but favored to represent a small. Adrenals/Urinary Tract: Bilateral adrenal glands bilateral kidneys are normal in appearance. No hydroureteronephrosis. Urinary bladder is normal in appearance. Stomach/Bowel: Normal appearance of the stomach. No pathologic dilatation of small  bowel or colon. Normal appendix. Vascular/Lymphatic: Atherosclerosis throughout the abdominal and pelvic vasculature, without evidence of aneurysm or dissection. Soft tissue mass in the upper abdomen measuring 2.8 x 3.3 cm (image 59 of series 2) likely to represent enlarged gastrohepatic ligament lymph node mass. Reproductive: Prostate gland seminal vesicles are unremarkable in appearance. Other: No significant volume of ascites.  No pneumoperitoneum. Musculoskeletal: There are no aggressive appearing lytic or blastic lesions noted in the visualized portions of the skeleton. IMPRESSION: 1. Mass-like thickening in the mid esophagus with adjacent paraesophageal lymphadenopathy, gastrohepatic ligament lymphadenopathy, and 2 lesions in the liver, concerning for metastatic disease. 2. Atherosclerosis, including left main and 3 vessel coronary artery disease. Please note that although the presence of coronary artery calcium documents the presence of coronary artery disease, the severity of this disease and any potential stenosis cannot be assessed on this non-gated CT examination. Assessment for potential risk factor modification, dietary therapy or pharmacologic therapy may be warranted, if clinically indicated. 3. Additional incidental findings, as above. Electronically Signed   By: DVinnie LangtonM.D.   On: 09/09/2015 17:59   Ct Abdomen Pelvis W Contrast  09/09/2015  CLINICAL DATA:  55year old male with newly diagnosed proximal esophageal mass. Difficulty swallowing for the past 3 months. EXAM: CT CHEST, ABDOMEN, AND PELVIS WITH CONTRAST TECHNIQUE: Multidetector  CT imaging of the chest, abdomen and pelvis was performed following the standard protocol during bolus administration of intravenous contrast. CONTRAST:  13m OMNIPAQUE IOHEXOL 300 MG/ML  SOLN COMPARISON:  No priors. FINDINGS: CT CHEST FINDINGS Mediastinum/Lymph Nodes: Heart size is normal. Small amount of pericardial fluid and/or thickening, unlikely to  be of hemodynamic significance at this time. No associated pericardial calcification. There is atherosclerosis of the thoracic aorta, the great vessels of the mediastinum and the coronary arteries, including calcified atherosclerotic plaque in the left main, left anterior descending, left circumflex and right coronary arteries. Borderline enlarged enhancing 9 mm short axis paraesophageal lymph node (image 33 of series 2) highly suspicious given its proximity to the adjacent esophageal mass. Mass-like thickening throughout the midesophagus, compatible with the reported esophageal neoplasm. No axillary lymphadenopathy. Lungs/Pleura: No suspicious appearing pulmonary nodules or masses. No acute consolidative airspace disease. No pleural effusions. Musculoskeletal/Soft Tissues: There are no aggressive appearing lytic or blastic lesions noted in the visualized portions of the skeleton. CT ABDOMEN AND PELVIS FINDINGS Hepatobiliary: 2 ill-defined hypovascular hepatic lesions measuring 1.8 x 2.2 cm in the central aspect of segment 8, and 2.1 x 1.5 cm in segment 4A (images 53 of series 2 and 57 of series 2 respectively) are highly suspicious for hepatic metastasis. No intra or extrahepatic biliary ductal dilatation. Gallbladder is normal in appearance. Pancreas: No pancreatic mass. No pancreatic ductal dilatation. No pancreatic or peripancreatic fluid or inflammatory changes. Spleen: 1.7 cm well circumscribed low to intermediate attenuation lesion in the spleen is incompletely characterize, but favored to represent a small. Adrenals/Urinary Tract: Bilateral adrenal glands bilateral kidneys are normal in appearance. No hydroureteronephrosis. Urinary bladder is normal in appearance. Stomach/Bowel: Normal appearance of the stomach. No pathologic dilatation of small bowel or colon. Normal appendix. Vascular/Lymphatic: Atherosclerosis throughout the abdominal and pelvic vasculature, without evidence of aneurysm or dissection.  Soft tissue mass in the upper abdomen measuring 2.8 x 3.3 cm (image 59 of series 2) likely to represent enlarged gastrohepatic ligament lymph node mass. Reproductive: Prostate gland seminal vesicles are unremarkable in appearance. Other: No significant volume of ascites.  No pneumoperitoneum. Musculoskeletal: There are no aggressive appearing lytic or blastic lesions noted in the visualized portions of the skeleton. IMPRESSION: 1. Mass-like thickening in the mid esophagus with adjacent paraesophageal lymphadenopathy, gastrohepatic ligament lymphadenopathy, and 2 lesions in the liver, concerning for metastatic disease. 2. Atherosclerosis, including left main and 3 vessel coronary artery disease. Please note that although the presence of coronary artery calcium documents the presence of coronary artery disease, the severity of this disease and any potential stenosis cannot be assessed on this non-gated CT examination. Assessment for potential risk factor modification, dietary therapy or pharmacologic therapy may be warranted, if clinically indicated. 3. Additional incidental findings, as above. Electronically Signed   By: DVinnie LangtonM.D.   On: 09/09/2015 17:59   Dg Esophagus  08/27/2015  CLINICAL DATA:  Approximately 3 month history of sensation of food becoming stuck in the upper to mid esophageal region. No difficulty with water or other liquids. EXAM: ESOPHOGRAM / BARIUM SWALLOW / BARIUM TABLET STUDY TECHNIQUE: Combined double contrast and single contrast examination performed using effervescent crystals, thick barium liquid, and thin barium liquid. The patient was observed with fluoroscopy swallowing a 13 mm barium sulphate tablet. FLUOROSCOPY TIME:  Radiation Exposure Index (as provided by the fluoroscopic device): 43 dGy If the device does not provide the exposure index: Fluoroscopy Time:  1 minutes, 30 seconds Number of Acquired Images:  17  COMPARISON:  None in PACs FINDINGS: The anticipated procedure  was discussed with Mr. Takeshita. He voiced his willingness to proceed. The cervical esophagus distended well. There was no laryngeal penetration of the barium. A tiny pulsion diverticulum was noted at the C7 level which filled and emptied promptly. In the mid esophagus there was long segment narrowing of the lumen with mucosal irregularity. This allowed passage of the liquid barium after mild delay. This would not allow passage of the 13 mm barium tablet despite ingestion of additional barium and water. The area of narrowing is approximately 5-6 cm in length. No reflux was observed.  There was no hiatal hernia. IMPRESSION: Long segment narrowing in the mid thoracic esophagus worrisome for malignancy or inflammation. Direct visualization is recommended. Elsewhere the esophagus exhibits no significant abnormality. These results will be called to the ordering clinician or representative by the Radiologist Assistant, and communication documented in the PACS or zVision Dashboard. Electronically Signed   By: David  Martinique M.D.   On: 08/27/2015 11:59    Labs:  CBC:  Recent Labs  08/17/15 2148 09/24/15 1115  WBC 9.1 7.1  HGB 12.2* 12.4*  HCT 37.1* 38.7*  PLT 374 395    COAGS:  Recent Labs  09/24/15 1115  INR 1.00    BMP:  Recent Labs  08/17/15 2148 09/02/15 1452 09/24/15 1115  NA 138 139 140  K 2.8* 3.6 3.7  CL 99* 97 100*  CO2 30 33* 28  GLUCOSE 102* 86 94  BUN 10 11 11   CALCIUM 9.3 9.8 9.9  CREATININE 0.83 0.78 0.83  GFRNONAA >60  --  >60  GFRAA >60  --  >60    LIVER FUNCTION TESTS:  Recent Labs  09/02/15 1452 09/24/15 1115  BILITOT 0.5 0.8  AST 23 25  ALT 14 14*  ALKPHOS 69 62  PROT 7.9 7.9  ALBUMIN 4.2 4.1    Assessment and Plan: Esophageal cancer Liver lesions For US guided biopsy. Risks and Benefits discussed with the patient including, but not limited to bleeding, infection, damage to adjacent structures or low yield requiring additional tests. All of the  patient's questions were answered, patient is agreeable to proceed. Consent signed and in chart. Labs ok  Thank you for this interesting consult.  I greatly enjoyed meeting DEMETRI GOSHERT and look forward to participating in their care.  A copy of this report was sent to the requesting provider on this date.  Electronically Signed: Ascencion Dike 09/24/2015, 12:05 PM   I spent a total of 20 minutes in face to face in clinical consultation, greater than 50% of which was counseling/coordinating care for liver lesion biopsy

## 2015-09-27 ENCOUNTER — Ambulatory Visit: Admission: RE | Admit: 2015-09-27 | Payer: 59 | Source: Ambulatory Visit | Admitting: Radiation Oncology

## 2015-09-28 ENCOUNTER — Telehealth: Payer: Self-pay | Admitting: *Deleted

## 2015-09-28 NOTE — Telephone Encounter (Signed)
Langley Gauss RN with [facility] called with patient asking if he must be NPO for tomorrow's labs.  Instructed NPO 6 hrs before PET or 0700 am but does not have to be NPO for tomorrow's lab work cbc-diff, CMET and prealbumin.  No further questions.

## 2015-09-29 ENCOUNTER — Ambulatory Visit (HOSPITAL_COMMUNITY)
Admission: RE | Admit: 2015-09-29 | Discharge: 2015-09-29 | Disposition: A | Discharge status: Home / Self Care | Payer: 59 | Source: Ambulatory Visit | Attending: Hematology | Admitting: Hematology

## 2015-09-29 ENCOUNTER — Ambulatory Visit (HOSPITAL_BASED_OUTPATIENT_CLINIC_OR_DEPARTMENT_OTHER): Payer: 59 | Admitting: Hematology

## 2015-09-29 ENCOUNTER — Ambulatory Visit: Payer: 59 | Admitting: Nutrition

## 2015-09-29 ENCOUNTER — Other Ambulatory Visit (HOSPITAL_BASED_OUTPATIENT_CLINIC_OR_DEPARTMENT_OTHER): Payer: 59

## 2015-09-29 ENCOUNTER — Other Ambulatory Visit: Payer: 59

## 2015-09-29 VITALS — BP 152/77 | HR 56 | Temp 98.2°F | Resp 18 | Ht 69.0 in | Wt 128.2 lb

## 2015-09-29 DIAGNOSIS — C154 Malignant neoplasm of middle third of esophagus: Secondary | ICD-10-CM

## 2015-09-29 DIAGNOSIS — C787 Secondary malignant neoplasm of liver and intrahepatic bile duct: Secondary | ICD-10-CM | POA: Diagnosis not present

## 2015-09-29 DIAGNOSIS — C155 Malignant neoplasm of lower third of esophagus: Secondary | ICD-10-CM | POA: Insufficient documentation

## 2015-09-29 DIAGNOSIS — C159 Malignant neoplasm of esophagus, unspecified: Secondary | ICD-10-CM | POA: Diagnosis not present

## 2015-09-29 DIAGNOSIS — I313 Pericardial effusion (noninflammatory): Secondary | ICD-10-CM | POA: Insufficient documentation

## 2015-09-29 LAB — COMPREHENSIVE METABOLIC PANEL
ALBUMIN: 3.9 g/dL (ref 3.5–5.0)
ALK PHOS: 71 U/L (ref 40–150)
ALT: 9 U/L (ref 0–55)
AST: 22 U/L (ref 5–34)
Anion Gap: 9 mEq/L (ref 3–11)
BUN: 12.1 mg/dL (ref 7.0–26.0)
CALCIUM: 9.7 mg/dL (ref 8.4–10.4)
CO2: 31 mEq/L — ABNORMAL HIGH (ref 22–29)
CREATININE: 0.8 mg/dL (ref 0.7–1.3)
Chloride: 100 mEq/L (ref 98–109)
EGFR: >90 mL/min/{1.73_m2} (ref >90)
Glucose: 92 mg/dl (ref 70–140)
POTASSIUM: 3.8 meq/L (ref 3.5–5.1)
Sodium: 140 mEq/L (ref 136–145)
Total Bilirubin: 0.5 mg/dL (ref 0.20–1.20)
Total Protein: 7.8 g/dL (ref 6.4–8.3)

## 2015-09-29 LAB — CBC & DIFF AND RETIC
BASO%: 0.7 % (ref 0.0–2.0)
BASOS ABS: 0.1 10*3/uL (ref 0.0–0.1)
EOS ABS: 0.1 10*3/uL (ref 0.0–0.5)
EOS%: 1.9 % (ref 0.0–7.0)
HEMATOCRIT: 37.2 % — AB (ref 38.4–49.9)
HEMOGLOBIN: 12.4 g/dL — AB (ref 13.0–17.1)
Immature Retic Fract: 3.6 % (ref 3.00–10.60)
LYMPH%: 33.4 % (ref 14.0–49.0)
MCH: 30.4 pg (ref 27.2–33.4)
MCHC: 33.3 g/dL (ref 32.0–36.0)
MCV: 91.2 fL (ref 79.3–98.0)
MONO#: 0.7 10*3/uL (ref 0.1–0.9)
MONO%: 10.1 % (ref 0.0–14.0)
NEUT#: 3.8 10*3/uL (ref 1.5–6.5)
NEUT%: 53.9 % (ref 39.0–75.0)
PLATELETS: 340 10*3/uL (ref 140–400)
RBC: 4.08 10*6/uL — ABNORMAL LOW (ref 4.20–5.82)
RDW: 14.9 % — ABNORMAL HIGH (ref 11.0–14.6)
RETIC %: 0.75 % — AB (ref 0.80–1.80)
Retic Ct Abs: 30.6 10*3/uL — ABNORMAL LOW (ref 34.80–93.90)
WBC: 7 10*3/uL (ref 4.0–10.3)
lymph#: 2.3 10*3/uL (ref 0.9–3.3)

## 2015-09-29 LAB — GLUCOSE, CAPILLARY: GLUCOSE-CAPILLARY: 76 mg/dL (ref 65–99)

## 2015-09-29 MED ORDER — FIRST-BXN MOUTHWASH MT SUSP: 5.0000 mL | Freq: Four times a day (QID) | OROMUCOSAL | Status: DC | PRN

## 2015-09-29 MED ORDER — ONDANSETRON HCL 8 MG PO TABS: 8.0000 mg | ORAL_TABLET | Freq: Three times a day (TID) | ORAL | Status: DC | PRN

## 2015-09-29 MED ORDER — SUCRALFATE 1 GM/10ML PO SUSP: 1.0000 g | Freq: Three times a day (TID) | ORAL | Status: DC

## 2015-09-29 MED ORDER — OMEPRAZOLE 20 MG PO CPDR: 20.0000 mg | DELAYED_RELEASE_CAPSULE | Freq: Two times a day (BID) | ORAL | Status: DC

## 2015-09-29 MED ORDER — OXYCODONE HCL 5 MG/5ML PO SOLN: 5.0000 mg | ORAL | Status: DC | PRN

## 2015-09-29 MED ORDER — FLUDEOXYGLUCOSE F - 18 (FDG) INJECTION
6.0000 | Freq: Once | INTRAVENOUS | Status: AC | PRN
  Administered 2015-09-29: 6 via INTRAVENOUS

## 2015-09-29 NOTE — Progress Notes (Signed)
55 year old male diagnosed with esophageal cancer with liver lesions.  He is a patient of Dr. Irene Limbo.  Past medical history includes hypertension.  Medications include Prilosec.  Labs include albumin 4.1 on January 20.  Height: 69 inches. Weight: 124.2 pounds on January 20 Usual body weight: 130 pounds. BMI: 18.33.  Patient reports his appetite is fair. He tries to eat but it takes him a little bit longer and he has to chop foods to be smaller and use gravies and sauces. Patient received oral nutrition supplement samples, however, has not tried them yet. He typically consumes 3 meals a day. Tries to follow a low-sodium diet.  Nutrition diagnosis: Unintended weight loss related to esophageal cancer as evidenced by 6 pound weight loss from usual body weight.  Intervention:  I educated patient to increase meals and snacks to 6 times a day consuming higher calorie, higher protein foods. Encouraged patient to try oral nutrition supplements to begin consuming as tolerated at least twice a day Reviewed importance of increased calories and protein in patient's diet. Provided multiple fact sheets on increasing calories and protein and soft moist foods for easier swallowing. Questions were answered.  Teach back method used.  Contact information provided.  Monitoring, evaluation, goals: Patient will tolerate increased calories and protein to minimize further weight loss.  Next visit: To be scheduled as needed.  **Disclaimer: This note was dictated with voice recognition software. Similar sounding words can inadvertently be transcribed and this note may contain transcription errors which may not have been corrected upon publication of note.**

## 2015-09-30 ENCOUNTER — Ambulatory Visit: Payer: 59 | Admitting: Hematology

## 2015-09-30 ENCOUNTER — Telehealth: Payer: Self-pay | Admitting: Hematology

## 2015-09-30 LAB — PREALBUMIN: PREALBUMIN: 17 mg/dL (ref 10–36)

## 2015-09-30 NOTE — Telephone Encounter (Signed)
cld pt and left message to adv of appt time & date

## 2015-10-01 ENCOUNTER — Ambulatory Visit
Admission: RE | Admit: 2015-10-01 | Discharge: 2015-10-01 | Disposition: A | Discharge status: Home / Self Care | Payer: 59 | Source: Ambulatory Visit | Attending: Radiation Oncology | Admitting: Radiation Oncology

## 2015-10-01 ENCOUNTER — Encounter: Payer: Self-pay | Admitting: Hematology

## 2015-10-01 ENCOUNTER — Encounter: Payer: Self-pay | Admitting: *Deleted

## 2015-10-01 DIAGNOSIS — C155 Malignant neoplasm of lower third of esophagus: Secondary | ICD-10-CM

## 2015-10-01 DIAGNOSIS — Z510 Encounter for antineoplastic radiation therapy: Secondary | ICD-10-CM | POA: Diagnosis not present

## 2015-10-01 NOTE — Progress Notes (Signed)
  Radiation Oncology         [phone] ________________________________  Name: Samuel Henderson MRN: [id number]  Date: 10/01/2015  DOB: 03-23-1961  SIMULATION AND TREATMENT PLANNING NOTE    ICD-9-CM ICD-10-CM   1. 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  NARRATIVE:  The patient was brought to the [facility].  Identity was confirmed.  All relevant records and images related to the planned course of therapy were reviewed.  The patient freely provided informed written consent to proceed with treatment after reviewing the details related to the planned course of therapy. The consent form was witnessed and verified by the simulation staff.  Then, the patient was set-up in a stable reproducible supine position for radiation therapy.  CT images were obtained.  Surface markings were placed.  The CT images were loaded into the planning software.  Then the target and avoidance structures were contoured.  Treatment planning then occurred.  The radiation prescription was entered and confirmed.  Then, I designed and supervised the construction of a total of 3 medically necessary complex treatment devices in the form of MLCs to shield the heart and spinal cord. I have requested : 3D Simulation  I have requested a DVH of the following structures: heart, left lung, right lung, spinal cord, and target.  I have ordered:Nutrition Consult  PLAN:  The patient will receive 30 Gy in 10 fraction.  ________________________________  Sheral Apley Tammi Klippel, M.D. This document serves as a record of services personally performed by [name], MD. It was created on his behalf by Jenell Milliner, a trained medical scribe. The creation of this record is based on the scribe's personal observations and the provider's statements to them. This document has been  checked and approved by the attending provider.

## 2015-10-01 NOTE — Progress Notes (Signed)
Oncology Nurse Navigator Documentation  Oncology Nurse Navigator Flowsheets 10/01/2015  Navigator Location [facility]  Navigator Encounter Type Other--2 week f/u  Telephone -  Abnormal Finding Date -  Confirmed Diagnosis Date -  Patient Visit Type RadOnc  Treatment Phase CT SIM  Barriers/Navigation Needs Education  Education Pain/ Symptom Management--esophageal discomfort - soft diet; low acidic/spicy foods; Carafate suspension Any time he is here for radiation, let therapist know if he is having trouble.  Interventions -  Referrals -  Coordination of Care -  Education Method Verbal;Teach-back  Support Groups/Services -  Acuity Level 1  Time Spent with Patient 15

## 2015-10-01 NOTE — Progress Notes (Signed)
[name] Kitchen    HEMATOLOGY/ONCOLOGY CLINIC NOTE  Date of Service: .09/29/2015  Patient Care Team: Leanna Battles, MD as PCP - General (Internal Medicine)  CHIEF COMPLAINTS/PURPOSE OF CONSULTATION:  Newly diagnosed Esophageal Squamous cell carcinoma   HISTORY OF PRESENTING ILLNESS: plz see my initial consultation regarding patients initial presentation  INTERVAL HISTORY  Samuel Henderson is here for his scheduled followup. His PET/CT and US guided liver mass biopsy confirmed that the liver lesions do represent metastatic carcinoma. He notes no acute new symptoms.  MEDICAL HISTORY:  Past Medical History  Diagnosis Date  . Hypertension   . Arthritis   . Esophageal cancer ([place])   . Knee pain     SURGICAL HISTORY: Past Surgical History  Procedure Laterality Date  . Knee surgery    . Lesion removal  1969    hip  . Esophagogastroduodenoscopy (egd) with esophageal dilation      and biopsy    SOCIAL HISTORY: Social History   Social History  . Marital Status: Single    Spouse Name: N/A  . Number of Children: 2  . Years of Education: N/A   Occupational History  . Manufacturing industrial curtains    Social History Main Topics  . Smoking status: Current Every Day Smoker -- 0.25 packs/day for 20 years    Types: Cigarettes  . Smokeless tobacco: Never Used  . Alcohol Use: No  . Drug Use: Yes    Special: Marijuana     Comment: last smoked 1 week ago  . Sexual Activity: Yes   Other Topics Concern  . 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 Sober for 15 yrs  Ex smoker 1/2 PPD x 57yr quit about 18 yrs ago.  FAMILY HISTORY: Family History  Problem Relation Age of Onset  . Colon cancer Neg Hx   . Esophageal cancer Father   .  Cancer Father     nasopharyngeal   . Diabetes Sister   . Cancer Sister     multiple myeloma    ALLERGIES:  has No Known Allergies.  MEDICATIONS:  Current Outpatient Prescriptions  Medication Sig Dispense Refill  . amLODipine (NORVASC) 5 MG tablet Take 5 mg by mouth daily.    . Diphenhyd-Lidocaine-Nystatin (FIRST-BXN MOUTHWASH) SUSP Take 5 mLs by mouth 4 (four) times daily as needed (radiation related esophageal pain). 1 Bottle 2  . lisinopril-hydrochlorothiazide (PRINZIDE,ZESTORETIC) 10-12.5 MG per tablet Take 1 tablet by mouth daily.    . meloxicam (MOBIC) 15 MG tablet Take 15 mg by mouth daily.    .[name] Kitchenomeprazole (PRILOSEC) 20 MG capsule Take 1 capsule (20 mg total) by mouth 2 (two) times daily before a meal. 60 capsule 2  . ondansetron (ZOFRAN) 8 MG tablet Take 1 tablet (8 mg total) by mouth every 8 (eight) hours as needed for nausea. 30 tablet 3  . oxyCODONE (ROXICODONE) 5 MG/5ML solution Take 5-10 mLs (5-10 mg total) by mouth every 4 (four) hours as needed for moderate pain or severe pain. 200 mL 0  . sucralfate (CARAFATE) 1 GM/10ML suspension Take 10 mLs (1 g total) by mouth 4 (four) times daily -  with meals and at bedtime. 420 mL 0  .  VIAGRA 100 MG tablet      No current facility-administered medications for this visit.    REVIEW OF SYSTEMS:    10 Point review of Systems was done is negative except as noted above.  PHYSICAL EXAMINATION: ECOG PERFORMANCE STATUS: 1 - Symptomatic but completely ambulatory  . Filed Vitals:   09/29/15 1535  BP: 152/77  Pulse: 56  Temp: 98.2 F (36.8 C)  Resp: 18   Filed Weights   09/29/15 1535  Weight: 128 lb 3.2 oz (58.151 kg)   .Body mass index is 18.92 kg/(m^2).  GENERAL:middle aged AAM ,alert, in no acute distress and comfortable SKIN: skin color, texture, turgor are normal, no rashes or significant lesions EYES: normal, conjunctiva are pink and non-injected, sclera clear OROPHARYNX:no exudate, no erythema and lips, buccal  mucosa, and tongue normal  NECK: supple, no JVD, thyroid normal size, non-tender, without nodularity LYMPH:  no palpable lymphadenopathy in the cervical, axillary or inguinal LUNGS: clear to auscultation with normal respiratory effort HEART: regular rate & rhythm,  no murmurs and no lower extremity edema ABDOMEN: abdomen soft, non-tender, normoactive bowel sounds  Musculoskeletal: no cyanosis of digits and no clubbing  PSYCH: alert & oriented x 3 with fluent speech NEURO: no focal motor/sensory deficits  LABORATORY DATA:  I have reviewed the data as listed  . CBC Latest Ref Rng 09/29/2015 09/24/2015 08/17/2015  WBC 4.0 - 10.3 10e3/uL 7.0 7.1 9.1  Hemoglobin 13.0 - 17.1 g/dL 12.4(L) 12.4(L) 12.2(L)  Hematocrit 38.4 - 49.9 % 37.2(L) 38.7(L) 37.1(L)  Platelets 140 - 400 10e3/uL 340 395 374    . CMP Latest Ref Rng 09/29/2015 09/24/2015 09/02/2015  Glucose 70 - 140 mg/dl 92 94 86  BUN 7.0 - 26.0 mg/dL 12.1 11 11   Creatinine 0.7 - 1.3 mg/dL 0.8 0.83 0.78  Sodium 136 - 145 mEq/L 140 140 139  Potassium 3.5 - 5.1 mEq/L 3.8 3.7 3.6  Chloride 101 - 111 mmol/L - 100(L) 97  CO2 22 - 29 mEq/L 31(H) 28 33(H)  Calcium 8.4 - 10.4 mg/dL 9.7 9.9 9.8  Total Protein 6.4 - 8.3 g/dL 7.8 7.9 7.9  Total Bilirubin 0.20 - 1.20 mg/dL 0.50 0.8 0.5  Alkaline Phos 40 - 150 U/L 71 62 69  AST 5 - 34 U/L 22 25 23   ALT 0 - 55 U/L 9 14(L) 14     RADIOGRAPHIC STUDIES: I have personally reviewed the radiological images as listed and agreed with the findings in the report. Ct Chest W Contrast  09/09/2015  CLINICAL DATA:  55 year old male with newly diagnosed proximal esophageal mass. Difficulty swallowing for the past 3 months. EXAM: CT CHEST, ABDOMEN, AND PELVIS WITH CONTRAST TECHNIQUE: Multidetector CT imaging of the chest, abdomen and pelvis was performed following the standard protocol during bolus administration of intravenous contrast. CONTRAST:  144m OMNIPAQUE IOHEXOL 300 MG/ML  SOLN COMPARISON:  No priors.  FINDINGS: CT CHEST FINDINGS Mediastinum/Lymph Nodes: Heart size is normal. Small amount of pericardial fluid and/or thickening, unlikely to be of hemodynamic significance at this time. No associated pericardial calcification. There is atherosclerosis of the thoracic aorta, the great vessels of the mediastinum and the coronary arteries, including calcified atherosclerotic plaque in the left main, left anterior descending, left circumflex and right coronary arteries. Borderline enlarged enhancing 9 mm short axis paraesophageal lymph node (image 33 of series 2) highly suspicious given its proximity to the adjacent esophageal mass. Mass-like thickening throughout the midesophagus, compatible with the reported esophageal neoplasm. No axillary lymphadenopathy. Lungs/Pleura: No suspicious  appearing pulmonary nodules or masses. No acute consolidative airspace disease. No pleural effusions. Musculoskeletal/Soft Tissues: There are no aggressive appearing lytic or blastic lesions noted in the visualized portions of the skeleton. CT ABDOMEN AND PELVIS FINDINGS Hepatobiliary: 2 ill-defined hypovascular hepatic lesions measuring 1.8 x 2.2 cm in the central aspect of segment 8, and 2.1 x 1.5 cm in segment 4A (images 53 of series 2 and 57 of series 2 respectively) are highly suspicious for hepatic metastasis. No intra or extrahepatic biliary ductal dilatation. Gallbladder is normal in appearance. Pancreas: No pancreatic mass. No pancreatic ductal dilatation. No pancreatic or peripancreatic fluid or inflammatory changes. Spleen: 1.7 cm well circumscribed low to intermediate attenuation lesion in the spleen is incompletely characterize, but favored to represent a small. Adrenals/Urinary Tract: Bilateral adrenal glands bilateral kidneys are normal in appearance. No hydroureteronephrosis. Urinary bladder is normal in appearance. Stomach/Bowel: Normal appearance of the stomach. No pathologic dilatation of small bowel or colon. Normal  appendix. Vascular/Lymphatic: Atherosclerosis throughout the abdominal and pelvic vasculature, without evidence of aneurysm or dissection. Soft tissue mass in the upper abdomen measuring 2.8 x 3.3 cm (image 59 of series 2) likely to represent enlarged gastrohepatic ligament lymph node mass. Reproductive: Prostate gland seminal vesicles are unremarkable in appearance. Other: No significant volume of ascites.  No pneumoperitoneum. Musculoskeletal: There are no aggressive appearing lytic or blastic lesions noted in the visualized portions of the skeleton. IMPRESSION: 1. Mass-like thickening in the mid esophagus with adjacent paraesophageal lymphadenopathy, gastrohepatic ligament lymphadenopathy, and 2 lesions in the liver, concerning for metastatic disease. 2. Atherosclerosis, including left main and 3 vessel coronary artery disease. Please note that although the presence of coronary artery calcium documents the presence of coronary artery disease, the severity of this disease and any potential stenosis cannot be assessed on this non-gated CT examination. Assessment for potential risk factor modification, dietary therapy or pharmacologic therapy may be warranted, if clinically indicated. 3. Additional incidental findings, as above. Electronically Signed   By: Vinnie Langton M.D.   On: 09/09/2015 17:59   Ct Abdomen Pelvis W Contrast  09/09/2015  CLINICAL DATA:  55 year old male with newly diagnosed proximal esophageal mass. Difficulty swallowing for the past 3 months. EXAM: CT CHEST, ABDOMEN, AND PELVIS WITH CONTRAST TECHNIQUE: Multidetector CT imaging of the chest, abdomen and pelvis was performed following the standard protocol during bolus administration of intravenous contrast. CONTRAST:  170m OMNIPAQUE IOHEXOL 300 MG/ML  SOLN COMPARISON:  No priors. FINDINGS: CT CHEST FINDINGS Mediastinum/Lymph Nodes: Heart size is normal. Small amount of pericardial fluid and/or thickening, unlikely to be of hemodynamic  significance at this time. No associated pericardial calcification. There is atherosclerosis of the thoracic aorta, the great vessels of the mediastinum and the coronary arteries, including calcified atherosclerotic plaque in the left main, left anterior descending, left circumflex and right coronary arteries. Borderline enlarged enhancing 9 mm short axis paraesophageal lymph node (image 33 of series 2) highly suspicious given its proximity to the adjacent esophageal mass. Mass-like thickening throughout the midesophagus, compatible with the reported esophageal neoplasm. No axillary lymphadenopathy. Lungs/Pleura: No suspicious appearing pulmonary nodules or masses. No acute consolidative airspace disease. No pleural effusions. Musculoskeletal/Soft Tissues: There are no aggressive appearing lytic or blastic lesions noted in the visualized portions of the skeleton. CT ABDOMEN AND PELVIS FINDINGS Hepatobiliary: 2 ill-defined hypovascular hepatic lesions measuring 1.8 x 2.2 cm in the central aspect of segment 8, and 2.1 x 1.5 cm in segment 4A (images 53 of series 2 and 57 of series 2  respectively) are highly suspicious for hepatic metastasis. No intra or extrahepatic biliary ductal dilatation. Gallbladder is normal in appearance. Pancreas: No pancreatic mass. No pancreatic ductal dilatation. No pancreatic or peripancreatic fluid or inflammatory changes. Spleen: 1.7 cm well circumscribed low to intermediate attenuation lesion in the spleen is incompletely characterize, but favored to represent a small. Adrenals/Urinary Tract: Bilateral adrenal glands bilateral kidneys are normal in appearance. No hydroureteronephrosis. Urinary bladder is normal in appearance. Stomach/Bowel: Normal appearance of the stomach. No pathologic dilatation of small bowel or colon. Normal appendix. Vascular/Lymphatic: Atherosclerosis throughout the abdominal and pelvic vasculature, without evidence of aneurysm or dissection. Soft tissue mass in  the upper abdomen measuring 2.8 x 3.3 cm (image 59 of series 2) likely to represent enlarged gastrohepatic ligament lymph node mass. Reproductive: Prostate gland seminal vesicles are unremarkable in appearance. Other: No significant volume of ascites.  No pneumoperitoneum. Musculoskeletal: There are no aggressive appearing lytic or blastic lesions noted in the visualized portions of the skeleton. IMPRESSION: 1. Mass-like thickening in the mid esophagus with adjacent paraesophageal lymphadenopathy, gastrohepatic ligament lymphadenopathy, and 2 lesions in the liver, concerning for metastatic disease. 2. Atherosclerosis, including left main and 3 vessel coronary artery disease. Please note that although the presence of coronary artery calcium documents the presence of coronary artery disease, the severity of this disease and any potential stenosis cannot be assessed on this non-gated CT examination. Assessment for potential risk factor modification, dietary therapy or pharmacologic therapy may be warranted, if clinically indicated. 3. Additional incidental findings, as above. Electronically Signed   By: Vinnie Langton M.D.   On: 09/09/2015 17:59   Nm Pet Image Initial (pi) Skull Base To Thigh  09/29/2015  CLINICAL DATA:  Initial treatment strategy for esophageal cancer. EXAM: NUCLEAR MEDICINE PET SKULL BASE TO THIGH TECHNIQUE: 6.0 mCi F-18 FDG was injected intravenously. Full-ring PET imaging was performed from the skull base to thigh after the radiotracer. CT data was obtained and used for attenuation correction and anatomic localization. FASTING BLOOD GLUCOSE:  Value: 76 mg/dl COMPARISON:  CT chest abdomen pelvis 09/09/2015. FINDINGS: NECK No hypermetabolic lymph nodes in the neck. CT images show no acute findings. CHEST Right supraclavicular adenopathy measures up to 10 mm (CT image 44) with an SUV max of 7.3. Mediastinal and right hilar lymph nodes are hypermetabolic. Index lymph node adjacent to the left  common carotid and left subclavian artery origins measures 10 mm (CT image 56) with an SUV max of 15.0. There are 2 areas of hypermetabolism within the esophagus, seen above the carina, where there is slight wall thickening (CT image 59), as well as within a distal esophageal mass. Lower esophageal mass measures approximately 2.0 x 3.5 cm (CT image 84) with an SUV max of 22.2. Adjacent periesophageal lymph nodes are hypermetabolic as well, measuring up to approximately 8 mm (CT image 79) with an SUV max of 6.6. No hypermetabolic pulmonary nodules. Small pericardial effusion. No pleural effusion. ABDOMEN/PELVIS There are 3 hypermetabolic lesions in the liver. Index heterogeneous lesion in the dome measures 2.6 cm (CT image 105) with an SUV max of 16.2. Gastrohepatic ligament adenopathy measures approximately 2.3 x 2.5 cm with an SUV max of 17.3. No abnormal hypermetabolism in the adrenal glands, spleen or pancreas. CT images show show the gallbladder, adrenal glands to be unremarkable. Sub cm low-attenuation lesion in the right kidney is too small to characterize. Low-attenuation lesion in the superior aspect of the spleen measures 1.9 cm and is not hypermetabolic, favoring a benign lesion.  Pancreas, stomach and bowel are otherwise unremarkable. Trace pelvic free fluid. SKELETON No abnormal osseous hypermetabolism. Degenerative changes are seen in the spine. IMPRESSION: 1. Hypermetabolic distal esophageal carcinoma with thoracic/upper abdominal nodal and hepatic metastatic disease. 2. Mid esophageal wall wall thickening and associated mild hypermetabolism. A second site of malignancy cannot be excluded. 3. Small pericardial effusion. Electronically Signed   By: Lorin Picket M.D.   On: 09/29/2015 14:51   US Biopsy  09/24/2015  CLINICAL DATA:  Esophageal carcinoma. Liver lesions noted on recent CT. EXAM: ULTRASOUND-GUIDED CORE LIVER LESION BIOPSY TECHNIQUE: An ultrasound guided liver biopsy was thoroughly  discussed with the patient and questions were answered. The benefits, risks, alternatives, and complications were also discussed. The patient understands and wishes to proceed with the procedure. A verbal as well as written consent was obtained. Survey ultrasound of the liver was performed, the lesions localized, and an appropriate skin entry site was determined. Skin site was marked, prepped with Betadine, and draped in usual sterile fashion, and infiltrated locally with 1% lidocaine. Intravenous Fentanyl and Versed were administered as conscious sedation during continuous monitoring of the patient's level of consciousness and physiological / cardiorespiratory status by the radiology RN, with a total moderate sedation time of 4 minutes. A 17 gauge trocar needle was advanced under ultrasound guidance into the liver to the margin of the segment 4A lesion. 3 coaxial 18gauge core samples were then obtained through the guide needle. The guide needle was removed. Post procedure scans demonstrate no apparent complication. COMPLICATIONS: COMPLICATIONS None immediate FINDINGS: The lesions in hepatic segments 4A and 8 were localized. Core biopsy sample obtained as above. IMPRESSION: 1. Technically successful ultrasound guided core liver lesion core biopsy. Electronically Signed   By: Lucrezia Europe M.D.   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 -followed with Dr Tammi Klippel (Rad onc) -discussed with him - to pursue palliative radiation to the esophageal primary to help with dysphagia and reduce risk of bleeding. -after completion of RT we will discuss palliative chemotherapy options. -diagnosis,  staging , natural history or disease, prognosis, goals of treatment and treatment options were discussed in details with the patient and all his and his families questions were answered in details to their apparent satisfaction. -given prescription for sucralfate, GI cocktail, prn oxycodone to address radiation esophagitis discomfort. -if needed will setup him up for 2X weekly IVF if unable to maintain hydration  RTC with Dr Irene Limbo in 2 weeks with rpt labs  All of the patients questions were answered to his apparent satisfaction. The patient knows to call the clinic with any problems, questions or concerns.  I spent 20 minutes counseling the patient face to face. The total time spent in the appointment was 25 minutes and more than 50% was on counseling and direct patient cares.    Samuel Lone MD Arispe AAHIVMS [facility] Hematology/Oncology Physician [facility]  (Office):       [phone] (Work cell):  [phone] (Fax):           [phone]  10/01/2015 11:17 PM

## 2015-10-07 DIAGNOSIS — Z510 Encounter for antineoplastic radiation therapy: Secondary | ICD-10-CM | POA: Diagnosis not present

## 2015-10-08 ENCOUNTER — Ambulatory Visit
Admission: RE | Admit: 2015-10-08 | Discharge: 2015-10-08 | Disposition: A | Discharge status: Home / Self Care | Payer: 59 | Source: Ambulatory Visit | Attending: Radiation Oncology | Admitting: Radiation Oncology

## 2015-10-08 DIAGNOSIS — C159 Malignant neoplasm of esophagus, unspecified: Secondary | ICD-10-CM

## 2015-10-08 DIAGNOSIS — Z510 Encounter for antineoplastic radiation therapy: Secondary | ICD-10-CM | POA: Diagnosis not present

## 2015-10-11 ENCOUNTER — Ambulatory Visit
Admission: RE | Admit: 2015-10-11 | Discharge: 2015-10-11 | Disposition: A | Discharge status: Home / Self Care | Payer: 59 | Source: Ambulatory Visit | Attending: Radiation Oncology | Admitting: Radiation Oncology

## 2015-10-11 DIAGNOSIS — Z510 Encounter for antineoplastic radiation therapy: Secondary | ICD-10-CM | POA: Diagnosis not present

## 2015-10-12 ENCOUNTER — Ambulatory Visit
Admission: RE | Admit: 2015-10-12 | Discharge: 2015-10-12 | Disposition: A | Discharge status: Home / Self Care | Payer: 59 | Source: Ambulatory Visit | Attending: Radiation Oncology | Admitting: Radiation Oncology

## 2015-10-12 DIAGNOSIS — Z510 Encounter for antineoplastic radiation therapy: Secondary | ICD-10-CM | POA: Diagnosis not present

## 2015-10-13 ENCOUNTER — Encounter: Payer: Self-pay | Admitting: Hematology

## 2015-10-13 ENCOUNTER — Ambulatory Visit
Admission: RE | Admit: 2015-10-13 | Discharge: 2015-10-13 | Disposition: A | Discharge status: Home / Self Care | Payer: 59 | Source: Ambulatory Visit | Attending: Radiation Oncology | Admitting: Radiation Oncology

## 2015-10-13 ENCOUNTER — Other Ambulatory Visit: Payer: Self-pay | Admitting: *Deleted

## 2015-10-13 ENCOUNTER — Ambulatory Visit (HOSPITAL_BASED_OUTPATIENT_CLINIC_OR_DEPARTMENT_OTHER): Payer: 59 | Admitting: Hematology

## 2015-10-13 ENCOUNTER — Telehealth: Payer: Self-pay | Admitting: Hematology

## 2015-10-13 ENCOUNTER — Other Ambulatory Visit (HOSPITAL_BASED_OUTPATIENT_CLINIC_OR_DEPARTMENT_OTHER): Payer: 59

## 2015-10-13 VITALS — BP 139/90 | HR 67 | Temp 98.4°F | Resp 17 | Ht 69.0 in | Wt 127.9 lb

## 2015-10-13 DIAGNOSIS — C787 Secondary malignant neoplasm of liver and intrahepatic bile duct: Secondary | ICD-10-CM

## 2015-10-13 DIAGNOSIS — C159 Malignant neoplasm of esophagus, unspecified: Secondary | ICD-10-CM

## 2015-10-13 DIAGNOSIS — Z510 Encounter for antineoplastic radiation therapy: Secondary | ICD-10-CM | POA: Diagnosis not present

## 2015-10-13 DIAGNOSIS — C154 Malignant neoplasm of middle third of esophagus: Secondary | ICD-10-CM | POA: Diagnosis not present

## 2015-10-13 DIAGNOSIS — E876 Hypokalemia: Secondary | ICD-10-CM

## 2015-10-13 DIAGNOSIS — R1310 Dysphagia, unspecified: Secondary | ICD-10-CM | POA: Diagnosis not present

## 2015-10-13 DIAGNOSIS — C155 Malignant neoplasm of lower third of esophagus: Secondary | ICD-10-CM

## 2015-10-13 LAB — COMPREHENSIVE METABOLIC PANEL
ALBUMIN: 3.6 g/dL (ref 3.5–5.0)
ALK PHOS: 81 U/L (ref 40–150)
ALT: 13 U/L (ref 0–55)
AST: 23 U/L (ref 5–34)
Anion Gap: 11 mEq/L (ref 3–11)
BILIRUBIN TOTAL: 0.49 mg/dL (ref 0.20–1.20)
BUN: 9.8 mg/dL (ref 7.0–26.0)
CALCIUM: 9.2 mg/dL (ref 8.4–10.4)
CO2: 28 mEq/L (ref 22–29)
CREATININE: 0.8 mg/dL (ref 0.7–1.3)
Chloride: 100 mEq/L (ref 98–109)
EGFR: >90 mL/min/{1.73_m2} (ref >90)
Glucose: 130 mg/dl (ref 70–140)
POTASSIUM: 3.3 meq/L — AB (ref 3.5–5.1)
Sodium: 139 mEq/L (ref 136–145)
Total Protein: 7.5 g/dL (ref 6.4–8.3)

## 2015-10-13 LAB — CBC & DIFF AND RETIC
BASO%: 0.6 % (ref 0.0–2.0)
Basophils Absolute: 0 10*3/uL (ref 0.0–0.1)
EOS ABS: 0.2 10*3/uL (ref 0.0–0.5)
EOS%: 3 % (ref 0.0–7.0)
HEMATOCRIT: 36.6 % — AB (ref 38.4–49.9)
HEMOGLOBIN: 12.2 g/dL — AB (ref 13.0–17.1)
Immature Retic Fract: 2 % — ABNORMAL LOW (ref 3.00–10.60)
LYMPH%: 32.1 % (ref 14.0–49.0)
MCH: 30.3 pg (ref 27.2–33.4)
MCHC: 33.3 g/dL (ref 32.0–36.0)
MCV: 91 fL (ref 79.3–98.0)
MONO#: 0.6 10*3/uL (ref 0.1–0.9)
MONO%: 9.7 % (ref 0.0–14.0)
NEUT#: 3.5 10*3/uL (ref 1.5–6.5)
NEUT%: 54.6 % (ref 39.0–75.0)
Platelets: 320 10*3/uL (ref 140–400)
RBC: 4.02 10*6/uL — ABNORMAL LOW (ref 4.20–5.82)
RDW: 14.9 % — AB (ref 11.0–14.6)
RETIC %: 0.84 % (ref 0.80–1.80)
Retic Ct Abs: 33.77 10*3/uL — ABNORMAL LOW (ref 34.80–93.90)
WBC: 6.4 10*3/uL (ref 4.0–10.3)
lymph#: 2.1 10*3/uL (ref 0.9–3.3)

## 2015-10-13 NOTE — Progress Notes (Signed)
Oriented patient to staff and routine of the clinic. Provided patient with RADIATION THERAPY AND YOU handbook then, reviewed pertinent information. Educated patient reference potential side effects and management such as, fatigue and throat changes. Answered all patient questions to the best of the my ability.  Provided patient with my business card and encouraged him to call with needs. Patient verbalized understanding of all reviewed.

## 2015-10-13 NOTE — Telephone Encounter (Signed)
per pof to sch pt appt-cld & left pt a message of time & date of appt °

## 2015-10-14 ENCOUNTER — Ambulatory Visit
Admission: RE | Admit: 2015-10-14 | Discharge: 2015-10-14 | Disposition: A | Discharge status: Home / Self Care | Payer: 59 | Source: Ambulatory Visit | Attending: Radiation Oncology | Admitting: Radiation Oncology

## 2015-10-14 ENCOUNTER — Encounter: Payer: Self-pay | Admitting: Radiation Oncology

## 2015-10-14 VITALS — BP 146/83 | HR 86 | Temp 99.0°F | Resp 20 | Wt 128.6 lb

## 2015-10-14 DIAGNOSIS — C159 Malignant neoplasm of esophagus, unspecified: Secondary | ICD-10-CM

## 2015-10-14 DIAGNOSIS — Z510 Encounter for antineoplastic radiation therapy: Secondary | ICD-10-CM | POA: Diagnosis not present

## 2015-10-14 NOTE — Progress Notes (Signed)
  Radiation Oncology         [phone]   Name: Samuel Henderson MRN: [id number]   Date: 10/14/2015  DOB: 1960/12/05   Weekly Radiation Therapy Management    ICD-9-CM ICD-10-CM   1. Primary esophageal squamous cell carcinoma (HCC) 150.9 C15.9     Current Dose: 12 Gy  Planned Dose:  30 Gy  Narrative The patient presents for routine under treatment assessment. Weekly rad txs esophagus 4/10 completed,   Occasional difficulty swallowing   Certain foods, breads especially,  Has started carafate last night. No pain at present, no nausea.  The patient is without complaint. Set-up films were reviewed. The chart was checked.  Physical Findings  weight is 128 lb 9.6 oz (58.333 kg). His temperature is 99 F (37.2 C). His blood pressure is 146/83 and his pulse is 86. His respiration is 20. . Weight essentially stable.  No significant changes.  Impression The patient is tolerating radiation.  Plan Continue treatment as planned.    Sheral Apley Tammi Klippel, M.D.  This document serves as a record of services personally performed by [name], MD. It was created on his behalf by Derek Mound, a trained medical scribe. The creation of this record is based on the scribe's personal observations and the provider's statements to them. This document has been checked and approved by the attending provider.

## 2015-10-14 NOTE — Progress Notes (Signed)
Weekly rad txs esophagus 4/10 completed,   Occasional difficulty swallowing   Certain foods, breads especially,  Has started carafate last night,   No pain at present, no nausea BP 146/83 mmHg  Pulse 86  Temp(Src) 99 F (37.2 C)  Resp 20  Wt 128 lb 9.6 oz (58.333 kg)  Wt Readings from Last 3 Encounters:  10/14/15 128 lb 9.6 oz (58.333 kg)  10/13/15 127 lb 14.4 oz (58.015 kg)  09/29/15 128 lb 3.2 oz (58.151 kg)

## 2015-10-15 ENCOUNTER — Ambulatory Visit
Admission: RE | Admit: 2015-10-15 | Discharge: 2015-10-15 | Disposition: A | Discharge status: Home / Self Care | Payer: 59 | Source: Ambulatory Visit | Attending: Radiation Oncology | Admitting: Radiation Oncology

## 2015-10-15 DIAGNOSIS — Z510 Encounter for antineoplastic radiation therapy: Secondary | ICD-10-CM | POA: Diagnosis not present

## 2015-10-16 DIAGNOSIS — E876 Hypokalemia: Secondary | ICD-10-CM | POA: Insufficient documentation

## 2015-10-16 NOTE — Progress Notes (Signed)
[name] Kitchen    HEMATOLOGY/ONCOLOGY CLINIC NOTE  Date of Service: .10/13/2015  Patient Care Team: Leanna Battles, MD as PCP - General (Internal Medicine)  CHIEF COMPLAINTS/PURPOSE OF CONSULTATION:  Newly diagnosed Esophageal Squamous cell carcinoma   HISTORY OF PRESENTING ILLNESS: plz see my initial consultation regarding patients initial presentation  INTERVAL HISTORY  Samuel Henderson is here for his scheduled 2 week followup. He notes his palliative RT is going well and he has not had any acute concerns. Still continues to work full time. Has been able to continue to maintain adequate po flood and fluid intake and does not feel the need to get IVF at this time. He notes that he hasnt but will start taking the PPI and sucralfate. No other acute new concerns. No significant weight loss since last visit.    MEDICAL HISTORY:  Past Medical History  Diagnosis Date  . Hypertension   . Arthritis   . Esophageal cancer ([place])   . Knee pain     SURGICAL HISTORY: Past Surgical History  Procedure Laterality Date  . Knee surgery    . Lesion removal  1969    hip  . Esophagogastroduodenoscopy (egd) with esophageal dilation      and biopsy    SOCIAL HISTORY: Social History   Social History  . Marital Status: Single    Spouse Name: N/A  . Number of Children: 2  . Years of Education: N/A   Occupational History  . Manufacturing industrial curtains    Social History Main Topics  . Smoking status: Current Every Day Smoker -- 0.25 packs/day for 20 years    Types: Cigarettes  . Smokeless tobacco: Never Used  . Alcohol Use: No  . Drug Use: Yes    Special: Marijuana     Comment: last smoked 1 week ago  . Sexual Activity: Yes   Other Topics Concern  . 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  FAMILY HISTORY: Family History  Problem Relation Age of Onset  . Colon cancer Neg Hx   . Esophageal cancer Father   . Cancer Father     nasopharyngeal   . Diabetes Sister   . Cancer Sister     multiple myeloma    ALLERGIES:  has No Known Allergies.  MEDICATIONS:  Current Outpatient Prescriptions  Medication Sig Dispense Refill  . amLODipine (NORVASC) 5 MG tablet Take 5 mg by mouth daily.    . Diphenhyd-Lidocaine-Nystatin (FIRST-BXN MOUTHWASH) SUSP Take 5 mLs by mouth 4 (four) times daily as needed (radiation related esophageal pain). 1 Bottle 2  . lisinopril-hydrochlorothiazide (PRINZIDE,ZESTORETIC) 10-12.5 MG per tablet Take 1 tablet by mouth daily.    . meloxicam (MOBIC) 15 MG tablet Take 15 mg by mouth daily.    .[name] Kitchenomeprazole (PRILOSEC) 20 MG capsule Take 1 capsule (20 mg total) by mouth 2 (two) times daily before a meal. 60 capsule 2  . ondansetron (ZOFRAN) 8 MG tablet Take 1 tablet (8 mg total) by mouth every 8 (eight) hours as needed for nausea. (Patient not taking: Reported on 10/14/2015) 30 tablet 3  .  oxyCODONE (ROXICODONE) 5 MG/5ML solution Take 5-10 mLs (5-10 mg total) by mouth every 4 (four) hours as needed for moderate pain or severe pain. (Patient not taking: Reported on 10/14/2015) 200 mL 0  . sucralfate (CARAFATE) 1 GM/10ML suspension Take 10 mLs (1 g total) by mouth 4 (four) times daily -  with meals and at bedtime. 420 mL 0  . VIAGRA 100 MG tablet      No current facility-administered medications for this visit.    REVIEW OF SYSTEMS:    10 Point review of Systems was done is negative except as noted above.  PHYSICAL EXAMINATION: ECOG PERFORMANCE STATUS: 1 - Symptomatic but completely ambulatory  . Filed Vitals:   10/13/15 1305  BP: 139/90  Pulse: 67  Temp: 98.4 F (36.9 C)  Resp: 17   Filed Weights   10/13/15 1305  Weight: 127  lb 14.4 oz (58.015 kg)   .Body mass index is 18.88 kg/(m^2).  [name] Kitchen Wt Readings from Last 3 Encounters:  10/14/15 128 lb 9.6 oz (58.333 kg)  10/13/15 127 lb 14.4 oz (58.015 kg)  09/29/15 128 lb 3.2 oz (58.151 kg)    GENERAL:middle aged AAM ,alert, in no acute distress and comfortable SKIN: skin color, texture, turgor are normal, no rashes or significant lesions EYES: normal, conjunctiva are pink and non-injected, sclera clear OROPHARYNX:no exudate, no erythema and lips, buccal mucosa, and tongue normal  NECK: supple, no JVD, thyroid normal size, non-tender, without nodularity LYMPH:  no palpable lymphadenopathy in the cervical, axillary or inguinal LUNGS: clear to auscultation with normal respiratory effort HEART: regular rate & rhythm,  no murmurs and no lower extremity edema ABDOMEN: abdomen soft, non-tender, normoactive bowel sounds  Musculoskeletal: no cyanosis of digits and no clubbing  PSYCH: alert & oriented x 3 with fluent speech NEURO: no focal motor/sensory deficits  LABORATORY DATA:  I have reviewed the data as listed  . CBC Latest Ref Rng 10/13/2015 09/29/2015 09/24/2015  WBC 4.0 - 10.3 10e3/uL 6.4 7.0 7.1  Hemoglobin 13.0 - 17.1 g/dL 12.2(L) 12.4(L) 12.4(L)  Hematocrit 38.4 - 49.9 % 36.6(L) 37.2(L) 38.7(L)  Platelets 140 - 400 10e3/uL 320 340 395    . CMP Latest Ref Rng 10/13/2015 09/29/2015 09/24/2015  Glucose 70 - 140 mg/dl 130 92 94  BUN 7.0 - 26.0 mg/dL 9.8 12.1 11  Creatinine 0.7 - 1.3 mg/dL 0.8 0.8 0.83  Sodium 136 - 145 mEq/L 139 140 140  Potassium 3.5 - 5.1 mEq/L 3.3(L) 3.8 3.7  Chloride 101 - 111 mmol/L - - 100(L)  CO2 22 - 29 mEq/L 28 31(H) 28  Calcium 8.4 - 10.4 mg/dL 9.2 9.7 9.9  Total Protein 6.4 - 8.3 g/dL 7.5 7.8 7.9  Total Bilirubin 0.20 - 1.20 mg/dL 0.49 0.50 0.8  Alkaline Phos 40 - 150 U/L 81 71 62  AST 5 - 34 U/L _0 ALT 0 - 55 U/L 13 9 14(L)     RADIOGRAPHIC STUDIES: I have personally reviewed the radiological images as listed and  agreed with the findings in the report. Nm Pet Image Initial (pi) Skull Base To Thigh  09/29/2015  CLINICAL DATA:  Initial treatment strategy for esophageal cancer. EXAM: NUCLEAR MEDICINE PET SKULL BASE TO THIGH TECHNIQUE: 6.0 mCi F-18 FDG was injected intravenously. Full-ring PET imaging was performed from the skull base to thigh after the radiotracer. CT data was obtained and used for attenuation correction and anatomic localization. FASTING BLOOD GLUCOSE:  Value: 76 mg/dl COMPARISON:  CT chest abdomen  pelvis 09/09/2015. FINDINGS: NECK No hypermetabolic lymph nodes in the neck. CT images show no acute findings. CHEST Right supraclavicular adenopathy measures up to 10 mm (CT image 44) with an SUV max of 7.3. Mediastinal and right hilar lymph nodes are hypermetabolic. Index lymph node adjacent to the left common carotid and left subclavian artery origins measures 10 mm (CT image 56) with an SUV max of 15.0. There are 2 areas of hypermetabolism within the esophagus, seen above the carina, where there is slight wall thickening (CT image 59), as well as within a distal esophageal mass. Lower esophageal mass measures approximately 2.0 x 3.5 cm (CT image 84) with an SUV max of 22.2. Adjacent periesophageal lymph nodes are hypermetabolic as well, measuring up to approximately 8 mm (CT image 79) with an SUV max of 6.6. No hypermetabolic pulmonary nodules. Small pericardial effusion. No pleural effusion. ABDOMEN/PELVIS There are 3 hypermetabolic lesions in the liver. Index heterogeneous lesion in the dome measures 2.6 cm (CT image 105) with an SUV max of 16.2. Gastrohepatic ligament adenopathy measures approximately 2.3 x 2.5 cm with an SUV max of 17.3. No abnormal hypermetabolism in the adrenal glands, spleen or pancreas. CT images show show the gallbladder, adrenal glands to be unremarkable. Sub cm low-attenuation lesion in the right kidney is too small to characterize. Low-attenuation lesion in the superior aspect of  the spleen measures 1.9 cm and is not hypermetabolic, favoring a benign lesion. Pancreas, stomach and bowel are otherwise unremarkable. Trace pelvic free fluid. SKELETON No abnormal osseous hypermetabolism. Degenerative changes are seen in the spine. IMPRESSION: 1. Hypermetabolic distal esophageal carcinoma with thoracic/upper abdominal nodal and hepatic metastatic disease. 2. Mid esophageal wall wall thickening and associated mild hypermetabolism. A second site of malignancy cannot be excluded. 3. Small pericardial effusion. Electronically Signed   By: Lorin Picket M.D.   On: 09/29/2015 14:51   US Biopsy  09/24/2015  CLINICAL DATA:  Esophageal carcinoma. Liver lesions noted on recent CT. EXAM: ULTRASOUND-GUIDED CORE LIVER LESION BIOPSY TECHNIQUE: An ultrasound guided liver biopsy was thoroughly discussed with the patient and questions were answered. The benefits, risks, alternatives, and complications were also discussed. The patient understands and wishes to proceed with the procedure. A verbal as well as written consent was obtained. Survey ultrasound of the liver was performed, the lesions localized, and an appropriate skin entry site was determined. Skin site was marked, prepped with Betadine, and draped in usual sterile fashion, and infiltrated locally with 1% lidocaine. Intravenous Fentanyl and Versed were administered as conscious sedation during continuous monitoring of the patient's level of consciousness and physiological / cardiorespiratory status by the radiology RN, with a total moderate sedation time of 4 minutes. A 17 gauge trocar needle was advanced under ultrasound guidance into the liver to the margin of the segment 4A lesion. 3 coaxial 18gauge core samples were then obtained through the guide needle. The guide needle was removed. Post procedure scans demonstrate no apparent complication. COMPLICATIONS: COMPLICATIONS None immediate FINDINGS: The lesions in hepatic segments 4A and 8 were  localized. Core biopsy sample obtained as above. IMPRESSION: 1. Technically successful ultrasound guided core liver lesion core biopsy. Electronically Signed   By: Lucrezia Europe M.D.   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 Does not prefer to take po potassium pills or liq.  Plan -soft foods/liquids at this time. -continue f/u with rad onc to complete palliative esophageal RT -after completion of RT in a few weeks we will discuss palliative chemotherapy options. -sucralfate, GI cocktail, prn oxycodone to address radiation esophagitis discomfort. -no indication for IVF at this time -food items which are potassium rich - increased intake - explained to patient.  RTC with Dr Irene Limbo in 2 weeks with rpt labs  I spent 15 minutes counseling the patient face to face. The total time spent in the appointment was 20 minutes and more than 50% was on counseling and direct patient cares.    Samuel Lone MD [place] AAHIVMS [facility] Hematology/Oncology Physician [facility]  (Office):       [phone] (Work cell):  [phone] (Fax):           [phone]

## 2015-10-18 ENCOUNTER — Ambulatory Visit
Admission: RE | Admit: 2015-10-18 | Discharge: 2015-10-18 | Disposition: A | Discharge status: Home / Self Care | Payer: 59 | Source: Ambulatory Visit | Attending: Radiation Oncology | Admitting: Radiation Oncology

## 2015-10-18 DIAGNOSIS — Z510 Encounter for antineoplastic radiation therapy: Secondary | ICD-10-CM | POA: Diagnosis not present

## 2015-10-19 ENCOUNTER — Ambulatory Visit
Admission: RE | Admit: 2015-10-19 | Discharge: 2015-10-19 | Disposition: A | Discharge status: Home / Self Care | Payer: 59 | Source: Ambulatory Visit | Attending: Radiation Oncology | Admitting: Radiation Oncology

## 2015-10-19 DIAGNOSIS — Z510 Encounter for antineoplastic radiation therapy: Secondary | ICD-10-CM | POA: Diagnosis not present

## 2015-10-20 ENCOUNTER — Ambulatory Visit
Admission: RE | Admit: 2015-10-20 | Discharge: 2015-10-20 | Disposition: A | Discharge status: Home / Self Care | Payer: 59 | Source: Ambulatory Visit | Attending: Radiation Oncology | Admitting: Radiation Oncology

## 2015-10-20 DIAGNOSIS — Z510 Encounter for antineoplastic radiation therapy: Secondary | ICD-10-CM | POA: Diagnosis not present

## 2015-10-21 ENCOUNTER — Ambulatory Visit
Admission: RE | Admit: 2015-10-21 | Discharge: 2015-10-21 | Disposition: A | Discharge status: Home / Self Care | Payer: 59 | Source: Ambulatory Visit | Attending: Radiation Oncology | Admitting: Radiation Oncology

## 2015-10-21 DIAGNOSIS — Z510 Encounter for antineoplastic radiation therapy: Secondary | ICD-10-CM | POA: Diagnosis not present

## 2015-10-22 ENCOUNTER — Ambulatory Visit
Admission: RE | Admit: 2015-10-22 | Discharge: 2015-10-22 | Disposition: A | Discharge status: Home / Self Care | Payer: 59 | Source: Ambulatory Visit | Attending: Radiation Oncology | Admitting: Radiation Oncology

## 2015-10-22 ENCOUNTER — Encounter: Payer: Self-pay | Admitting: Radiation Oncology

## 2015-10-22 VITALS — BP 143/92 | HR 73 | Resp 16 | Wt 125.2 lb

## 2015-10-22 DIAGNOSIS — Z510 Encounter for antineoplastic radiation therapy: Secondary | ICD-10-CM | POA: Diagnosis not present

## 2015-10-22 DIAGNOSIS — C155 Malignant neoplasm of lower third of esophagus: Secondary | ICD-10-CM

## 2015-10-22 NOTE — Progress Notes (Signed)
  Radiation Oncology         [phone]   Name: Samuel Henderson MRN: [id number]   Date: 10/22/2015  DOB: [birth year]   Weekly Radiation Therapy Management    ICD-9-CM ICD-10-CM   1. Squamous cell cancer of lower third of esophagus (HCC) 150.5 C15.5     Current Dose: 30 Gy  Planned Dose:  30 Gy  Narrative The patient presents for routine under treatment assessment. Three pound weight loss noted. BP elevated. Denies pain. Reports occasional difficulty swallowing. Reports he continues to use Carafate as directed. Denies skin changes within treatment field. Denies nausea or vomiting. Reports mild fatigue. One month follow up appointment card given. Patient understands to contact this RN [phone] with needs.   The patient is without complaint. Set-up films were reviewed. The chart was checked.  Physical Findings  weight is 125 lb 3.2 oz (56.79 kg). His blood pressure is 143/92 and his pulse is 73. His respiration is 16 and oxygen saturation is 100%. . Weight essentially stable.  No significant changes.  Impression The patient tolerated radiation relatively well.   Plan Complete radiation today as scheduled, and follow-up in one month. The patient was encouraged to call or return to the clinic in the interim for any worsening symptoms.     Sheral Apley Tammi Klippel, M.D.  This document serves as a record of services personally performed by [name], MD. It was created on his behalf by Derek Mound, a trained medical scribe. The creation of this record is based on the scribe's personal observations and the provider's statements to them. This document has been checked and approved by the attending provider.

## 2015-10-22 NOTE — Progress Notes (Signed)
Three pound weight loss noted. BP elevated. Denies pain. Reports occasional difficulty swallowing. Reports he continues to use Carafate as directed. Denies skin changes within treatment field. Denies nausea or vomiting. Reports mild fatigue. One month follow up appointment card given. Patient understands to contact this RN [phone] with needs.   BP 143/92 mmHg  Pulse 73  Resp 16  Wt 125 lb 3.2 oz (56.79 kg)  SpO2 100% Wt Readings from Last 3 Encounters:  10/22/15 125 lb 3.2 oz (56.79 kg)  10/14/15 128 lb 9.6 oz (58.333 kg)  10/13/15 127 lb 14.4 oz (58.015 kg)

## 2015-10-27 ENCOUNTER — Encounter: Payer: Self-pay | Admitting: *Deleted

## 2015-10-27 ENCOUNTER — Ambulatory Visit (HOSPITAL_BASED_OUTPATIENT_CLINIC_OR_DEPARTMENT_OTHER): Payer: 59 | Admitting: Hematology

## 2015-10-27 ENCOUNTER — Other Ambulatory Visit (HOSPITAL_BASED_OUTPATIENT_CLINIC_OR_DEPARTMENT_OTHER): Payer: 59

## 2015-10-27 ENCOUNTER — Other Ambulatory Visit: Payer: Self-pay | Admitting: *Deleted

## 2015-10-27 ENCOUNTER — Encounter: Payer: Self-pay | Admitting: Hematology

## 2015-10-27 VITALS — BP 134/77 | HR 88 | Temp 98.2°F | Resp 18 | Ht 69.0 in | Wt 126.2 lb

## 2015-10-27 DIAGNOSIS — C155 Malignant neoplasm of lower third of esophagus: Secondary | ICD-10-CM

## 2015-10-27 DIAGNOSIS — C787 Secondary malignant neoplasm of liver and intrahepatic bile duct: Secondary | ICD-10-CM | POA: Diagnosis not present

## 2015-10-27 DIAGNOSIS — H5000 Unspecified esotropia: Secondary | ICD-10-CM

## 2015-10-27 DIAGNOSIS — C159 Malignant neoplasm of esophagus, unspecified: Secondary | ICD-10-CM

## 2015-10-27 LAB — CBC & DIFF AND RETIC
BASO%: 0.5 % (ref 0.0–2.0)
Basophils Absolute: 0 10*3/uL (ref 0.0–0.1)
EOS ABS: 0.2 10*3/uL (ref 0.0–0.5)
EOS%: 2.7 % (ref 0.0–7.0)
HCT: 37.7 % — ABNORMAL LOW (ref 38.4–49.9)
HEMOGLOBIN: 12.8 g/dL — AB (ref 13.0–17.1)
Immature Retic Fract: 4.8 % (ref 3.00–10.60)
LYMPH%: 9.3 % — AB (ref 14.0–49.0)
MCH: 30.7 pg (ref 27.2–33.4)
MCHC: 34 g/dL (ref 32.0–36.0)
MCV: 90.4 fL (ref 79.3–98.0)
MONO#: 0.9 10*3/uL (ref 0.1–0.9)
MONO%: 12.7 % (ref 0.0–14.0)
NEUT%: 74.8 % (ref 39.0–75.0)
NEUTROS ABS: 5.5 10*3/uL (ref 1.5–6.5)
Platelets: 343 10*3/uL (ref 140–400)
RBC: 4.17 10*6/uL — ABNORMAL LOW (ref 4.20–5.82)
RDW: 14.5 % (ref 11.0–14.6)
Retic %: 0.79 % — ABNORMAL LOW (ref 0.80–1.80)
Retic Ct Abs: 32.94 10*3/uL — ABNORMAL LOW (ref 34.80–93.90)
WBC: 7.3 10*3/uL (ref 4.0–10.3)
lymph#: 0.7 10*3/uL — ABNORMAL LOW (ref 0.9–3.3)

## 2015-10-27 LAB — COMPREHENSIVE METABOLIC PANEL
ALBUMIN: 3.4 g/dL — AB (ref 3.5–5.0)
ALK PHOS: 60 U/L (ref 40–150)
ALT: 10 U/L (ref 0–55)
AST: 20 U/L (ref 5–34)
Anion Gap: 12 mEq/L — ABNORMAL HIGH (ref 3–11)
BUN: 9.3 mg/dL (ref 7.0–26.0)
CALCIUM: 9.8 mg/dL (ref 8.4–10.4)
CHLORIDE: 97 meq/L — AB (ref 98–109)
CO2: 29 mEq/L (ref 22–29)
Creatinine: 0.8 mg/dL (ref 0.7–1.3)
GLUCOSE: 112 mg/dL (ref 70–140)
POTASSIUM: 3.7 meq/L (ref 3.5–5.1)
SODIUM: 138 meq/L (ref 136–145)
Total Bilirubin: 0.67 mg/dL (ref 0.20–1.20)
Total Protein: 7.8 g/dL (ref 6.4–8.3)

## 2015-10-27 MED ORDER — OXYCODONE HCL 5 MG/5ML PO SOLN: 5.0000 mg | ORAL | Status: DC | PRN

## 2015-10-27 NOTE — Progress Notes (Signed)
Oncology Nurse Navigator Documentation  Oncology Nurse Navigator Flowsheets 10/27/2015  Navigator Location [facility]-Med Onc  Navigator Encounter Type Follow-up Appt: brief encounter in hall after seeing MD. Samuel Henderson he is feeling well.  Telephone -  Abnormal Finding Date -  Confirmed Diagnosis Date -  Patient Visit Type MedOnc;Post-XRT  Treatment Phase -  Barriers/Navigation Needs No Questions;No Needs  Education -  Interventions None required  Referrals -  Coordination of Care -  Education Method -  Support Groups/Services -  Acuity Level 1  Time Spent with Patient 5

## 2015-10-27 NOTE — Progress Notes (Signed)
[name] Kitchen    HEMATOLOGY/ONCOLOGY CLINIC NOTE  Date of Service: .10/27/2015  Patient Care Team: Leanna Battles, MD as PCP - General (Internal Medicine)  CHIEF COMPLAINTS/PURPOSE OF CONSULTATION:  Newly diagnosed Esophageal Squamous cell carcinoma   HISTORY OF PRESENTING ILLNESS: plz see my initial consultation regarding patients initial presentation  INTERVAL HISTORY  Samuel Henderson is here for his scheduled 2 week followup. He has completed radiation therapy last Friday. Notes some mild fatigue grade 1. Notes some mild esophageal discomfort. Has been on PPI, sucralfate and Magic mouthwash when necessary. Was given liquid oxycodone to use when necessary for uncontrolled pain. Wants to take a few days off from work to recover which is quite reasonable. Given if physician to recommend some time off of work to recover from his radiation therapy. We discussed that we will reassess his disease burden and check for progression at other nontreated sites in about 3-4 weeks to determine further course of action. He notes that he has been trying to eat well. Feels that his dysphagia is a little better but he still has to chew his food well and eat mainly softer foods. No other acute new symptoms.  MEDICAL HISTORY:  Past Medical History  Diagnosis Date  . Hypertension   . Arthritis   . Esophageal cancer ([place])   . Knee pain     SURGICAL HISTORY: Past Surgical History  Procedure Laterality Date  . Knee surgery    . Lesion removal  1969    hip  . Esophagogastroduodenoscopy (egd) with esophageal dilation      and biopsy    SOCIAL HISTORY: Social History   Social History  . Marital Status: Single    Spouse Name: N/A  . Number of Children: 2  . Years of Education: N/A   Occupational History  . Manufacturing industrial curtains    Social History Main Topics  . Smoking status: Current Every Day Smoker -- 0.25 packs/day for 20 years    Types: Cigarettes  . Smokeless tobacco: Never Used  .  Alcohol Use: No  . Drug Use: Yes    Special: Marijuana     Comment: last smoked 1 week ago  . Sexual Activity: Yes   Other Topics Concern  . 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  FAMILY HISTORY: Family History  Problem Relation Age of Onset  . Colon cancer Neg Hx   . Esophageal cancer Father   . Cancer Father     nasopharyngeal   . Diabetes Sister   . Cancer Sister     multiple myeloma    ALLERGIES:  has No Known Allergies.  MEDICATIONS:  Current Outpatient Prescriptions  Medication Sig Dispense Refill  . amLODipine (NORVASC) 5 MG tablet Take 5 mg by mouth daily.    . Diphenhyd-Lidocaine-Nystatin (FIRST-BXN MOUTHWASH) SUSP Take 5 mLs by mouth 4 (four) times daily as needed (radiation related esophageal pain). 1 Bottle 2  . lisinopril-hydrochlorothiazide (PRINZIDE,ZESTORETIC) 10-12.5 MG per tablet Take 1 tablet by mouth daily.    . meloxicam (MOBIC) 15 MG tablet Take 15 mg by mouth daily.    .[name] Kitchen  omeprazole (PRILOSEC) 20 MG capsule Take 1 capsule (20 mg total) by mouth 2 (two) times daily before a meal. 60 capsule 2  . ondansetron (ZOFRAN) 8 MG tablet Take 1 tablet (8 mg total) by mouth every 8 (eight) hours as needed for nausea. (Patient not taking: Reported on 10/22/2015) 30 tablet 3  . oxyCODONE (ROXICODONE) 5 MG/5ML solution Take 5-10 mLs (5-10 mg total) by mouth every 4 (four) hours as needed for moderate pain or severe pain. 200 mL 0  . sucralfate (CARAFATE) 1 GM/10ML suspension Take 10 mLs (1 g total) by mouth 4 (four) times daily -  with meals and at bedtime. 420 mL 0  . VIAGRA 100 MG tablet Reported on 10/22/2015     No current facility-administered  medications for this visit.    REVIEW OF SYSTEMS:    10 Point review of Systems was done is negative except as noted above.  PHYSICAL EXAMINATION: ECOG PERFORMANCE STATUS: 1 - Symptomatic but completely ambulatory  . Filed Vitals:   10/27/15 1006  BP: 134/77  Pulse: 88  Temp: 98.2 F (36.8 C)  Resp: 18   Filed Weights   10/27/15 1006  Weight: 126 lb 3.2 oz (57.244 kg)   .Body mass index is 18.63 kg/(m^2).  [name] Kitchen Wt Readings from Last 3 Encounters:  10/27/15 126 lb 3.2 oz (57.244 kg)  10/22/15 125 lb 3.2 oz (56.79 kg)  10/14/15 128 lb 9.6 oz (58.333 kg)    GENERAL:middle aged AAM ,alert, in no acute distress and comfortable SKIN: skin color, texture, turgor are normal, no rashes or significant lesions EYES: normal, conjunctiva are pink and non-injected, sclera clear OROPHARYNX:no exudate, no erythema and lips, buccal mucosa, and tongue normal  NECK: supple, no JVD, thyroid normal size, non-tender, without nodularity LYMPH:  no palpable lymphadenopathy in the cervical, axillary or inguinal LUNGS: clear to auscultation with normal respiratory effort HEART: regular rate & rhythm,  no murmurs and no lower extremity edema ABDOMEN: abdomen soft, non-tender, normoactive bowel sounds  Musculoskeletal: no cyanosis of digits and no clubbing  PSYCH: alert & oriented x 3 with fluent speech NEURO: no focal motor/sensory deficits  LABORATORY DATA:  I have reviewed the data as listed  . CBC Latest Ref Rng 10/27/2015 10/13/2015 09/29/2015  WBC 4.0 - 10.3 10e3/uL 7.3 6.4 7.0  Hemoglobin 13.0 - 17.1 g/dL 12.8(L) 12.2(L) 12.4(L)  Hematocrit 38.4 - 49.9 % 37.7(L) 36.6(L) 37.2(L)  Platelets 140 - 400 10e3/uL 343 320 340    . CMP Latest Ref Rng 10/27/2015 10/13/2015 09/29/2015  Glucose 70 - 140 mg/dl 112 130 92  BUN 7.0 - 26.0 mg/dL 9.3 9.8 12.1  Creatinine 0.7 - 1.3 mg/dL 0.8 0.8 0.8  Sodium 136 - 145 mEq/L 138 139 140  Potassium 3.5 - 5.1 mEq/L 3.7 3.3(L) 3.8  Chloride 101 - 111 mmol/L  - - -  CO2 22 - 29 mEq/L 29 28 31(H)  Calcium 8.4 - 10.4 mg/dL 9.8 9.2 9.7  Total Protein 6.4 - 8.3 g/dL 7.8 7.5 7.8  Total Bilirubin 0.20 - 1.20 mg/dL 0.67 0.49 0.50  Alkaline Phos 40 - 150 U/L 60 81 71  AST 5 - 34 U/L 20 23 22   ALT 0 - 55 U/L 10 13 9      RADIOGRAPHIC STUDIES: I have personally reviewed the radiological images as listed and agreed with the findings in the report. Nm Pet Image Initial (pi) Skull Base To Thigh  09/29/2015  CLINICAL DATA:  Initial treatment strategy for esophageal cancer. EXAM:  NUCLEAR MEDICINE PET SKULL BASE TO THIGH TECHNIQUE: 6.0 mCi F-18 FDG was injected intravenously. Full-ring PET imaging was performed from the skull base to thigh after the radiotracer. CT data was obtained and used for attenuation correction and anatomic localization. FASTING BLOOD GLUCOSE:  Value: 76 mg/dl COMPARISON:  CT chest abdomen pelvis 09/09/2015. FINDINGS: NECK No hypermetabolic lymph nodes in the neck. CT images show no acute findings. CHEST Right supraclavicular adenopathy measures up to 10 mm (CT image 44) with an SUV max of 7.3. Mediastinal and right hilar lymph nodes are hypermetabolic. Index lymph node adjacent to the left common carotid and left subclavian artery origins measures 10 mm (CT image 56) with an SUV max of 15.0. There are 2 areas of hypermetabolism within the esophagus, seen above the carina, where there is slight wall thickening (CT image 59), as well as within a distal esophageal mass. Lower esophageal mass measures approximately 2.0 x 3.5 cm (CT image 84) with an SUV max of 22.2. Adjacent periesophageal lymph nodes are hypermetabolic as well, measuring up to approximately 8 mm (CT image 79) with an SUV max of 6.6. No hypermetabolic pulmonary nodules. Small pericardial effusion. No pleural effusion. ABDOMEN/PELVIS There are 3 hypermetabolic lesions in the liver. Index heterogeneous lesion in the dome measures 2.6 cm (CT image 105) with an SUV max of 16.2.  Gastrohepatic ligament adenopathy measures approximately 2.3 x 2.5 cm with an SUV max of 17.3. No abnormal hypermetabolism in the adrenal glands, spleen or pancreas. CT images show show the gallbladder, adrenal glands to be unremarkable. Sub cm low-attenuation lesion in the right kidney is too small to characterize. Low-attenuation lesion in the superior aspect of the spleen measures 1.9 cm and is not hypermetabolic, favoring a benign lesion. Pancreas, stomach and bowel are otherwise unremarkable. Trace pelvic free fluid. SKELETON No abnormal osseous hypermetabolism. Degenerative changes are seen in the spine. IMPRESSION: 1. Hypermetabolic distal esophageal carcinoma with thoracic/upper abdominal nodal and hepatic metastatic disease. 2. Mid esophageal wall wall thickening and associated mild hypermetabolism. A second site of malignancy cannot be excluded. 3. Small pericardial effusion. Electronically Signed   By: Lorin Picket M.D.   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 2) HTN 3) Dysphagia due to esophageal SCC -slightly improved. 4) Hypokalemia -resolved 5) grade 2 radiation esophagitis. Symptoms fairly controlled with PPI, Magic mouthwash when necessary and sucralfate but might still need some pain medications in addition.  Plan -Patient has not completed his palliative radiation therapy to the esophagus and regional lymph nodes. He has tolerated treatment generally well except for some mild grade 2 radiation esophagitis. -soft foods/liquids at this time. -Given a few cans of ensure and encouraged good oral intake and hydration. -Given a letter to get a few days off from work to recover from his treatment. -sucralfate, GI cocktail,PPI and prn oxycodone to  address radiation esophagitis discomfort. -We will reimage him in 3-4 weeks which will be about 2 months after his previous imaging to reassess burden of disease.  RTC with Dr Irene Limbo in 3 weeks with rpt labs and CT chest abdomen pelvis to discuss further palliative treatment options.  I spent 15 minutes counseling the patient face to face. The total time spent in the appointment was 20 minutes and  more than 50% was on counseling and direct patient cares.    Sullivan Lone MD [place] AAHIVMS [facility] Hematology/Oncology Physician [facility]  (Office):       [phone] (Work cell):  [phone] (Fax):           [phone]

## 2015-10-28 ENCOUNTER — Telehealth: Payer: Self-pay | Admitting: *Deleted

## 2015-10-28 NOTE — Telephone Encounter (Signed)
Call from patient's mother Earlie Server. States that he is "agitated" and wants to know if he should be prescribed medication for this.  Callback number for Earlie Server [phone].

## 2015-11-01 NOTE — Progress Notes (Signed)
  Radiation Oncology         [phone] ________________________________  Name: DEONTREY MAISH MRN: [id number]  Date: 10/22/2015  DOB: Jun 18, 1961  End of Treatment Note   ICD-9-CM ICD-10-CM    1. Squamous cell cancer of lower third of esophagus (HCC) 150.5 C15.5     DIAGNOSIS: Mr. Schnack is a pleasant 55 year-old gentleman with squamous cell carcinoma of lower third of esophagus.     Indication for treatment:  Palliation of Dysphagia       Radiation treatment dates:  10/12/2015-10/22/2015  Site/dose:   The distal esophagus was treated to 30 Gy in 10 fractions of 3 Gy  Beams/energy:   A 3-field 3D plan was used with 6 and 10 MV X-rays  Narrative: The patient tolerated radiation treatment relatively well.   Dysphagia improved some.  Plan: The patient has completed radiation treatment. The patient will return to radiation oncology clinic for routine followup in one month. I advised him to call or return sooner if he has any questions or concerns related to his recovery or treatment. ________________________________  Sheral Apley. Tammi Klippel, M.D.

## 2015-11-03 ENCOUNTER — Other Ambulatory Visit: Payer: Self-pay | Admitting: *Deleted

## 2015-11-03 ENCOUNTER — Telehealth: Payer: Self-pay | Admitting: Hematology

## 2015-11-03 NOTE — Telephone Encounter (Signed)
per pof to sch pt appt-cld & left pt a message of time & date of appt °

## 2015-11-15 ENCOUNTER — Telehealth: Payer: Self-pay | Admitting: Hematology

## 2015-11-15 ENCOUNTER — Telehealth: Payer: Self-pay

## 2015-11-15 ENCOUNTER — Other Ambulatory Visit: Payer: Self-pay | Admitting: *Deleted

## 2015-11-15 DIAGNOSIS — C159 Malignant neoplasm of esophagus, unspecified: Secondary | ICD-10-CM

## 2015-11-15 NOTE — Telephone Encounter (Signed)
Patient's mother called stating that patient has "been sick since Friday".  She states that he has not been eating or drinking much, he is weak, has the chills, and a headache.  Darden Dates, RN aware and will address with Dr. Irene Limbo.

## 2015-11-15 NOTE — Telephone Encounter (Signed)
Called patient. See prior note

## 2015-11-15 NOTE — Telephone Encounter (Signed)
appt made for 3/14 per 3/13 pof. Pt aware

## 2015-11-15 NOTE — Progress Notes (Signed)
RN called patient to inquire about current symptoms. Patient states that he is fatigued and having little nausea with no vomiting. Patient complained of headache, off/on fever with chills throughout weekend. RN infomed MD Irene Limbo. POF sent to scheduler to set appointment for 11/16/15 @ 830 am. Patient notified and verbalized understanding.

## 2015-11-16 ENCOUNTER — Encounter: Payer: Self-pay | Admitting: *Deleted

## 2015-11-16 ENCOUNTER — Ambulatory Visit (HOSPITAL_BASED_OUTPATIENT_CLINIC_OR_DEPARTMENT_OTHER): Payer: 59 | Admitting: Hematology

## 2015-11-16 ENCOUNTER — Telehealth: Payer: Self-pay | Admitting: Hematology

## 2015-11-16 ENCOUNTER — Other Ambulatory Visit (HOSPITAL_BASED_OUTPATIENT_CLINIC_OR_DEPARTMENT_OTHER): Payer: 59

## 2015-11-16 ENCOUNTER — Encounter: Payer: Self-pay | Admitting: Hematology

## 2015-11-16 VITALS — BP 113/72 | HR 68 | Temp 99.4°F | Resp 18 | Ht 69.0 in | Wt 122.5 lb

## 2015-11-16 DIAGNOSIS — C787 Secondary malignant neoplasm of liver and intrahepatic bile duct: Secondary | ICD-10-CM

## 2015-11-16 DIAGNOSIS — E876 Hypokalemia: Secondary | ICD-10-CM

## 2015-11-16 DIAGNOSIS — C155 Malignant neoplasm of lower third of esophagus: Secondary | ICD-10-CM | POA: Diagnosis not present

## 2015-11-16 DIAGNOSIS — C159 Malignant neoplasm of esophagus, unspecified: Secondary | ICD-10-CM

## 2015-11-16 DIAGNOSIS — I10 Essential (primary) hypertension: Secondary | ICD-10-CM

## 2015-11-16 DIAGNOSIS — E860 Dehydration: Secondary | ICD-10-CM | POA: Diagnosis not present

## 2015-11-16 DIAGNOSIS — J069 Acute upper respiratory infection, unspecified: Secondary | ICD-10-CM

## 2015-11-16 LAB — COMPREHENSIVE METABOLIC PANEL
ALBUMIN: 3.4 g/dL — AB (ref 3.5–5.0)
ALK PHOS: 68 U/L (ref 40–150)
ALT: 18 U/L (ref 0–55)
AST: 44 U/L — AB (ref 5–34)
Anion Gap: 10 mEq/L (ref 3–11)
BUN: 8 mg/dL (ref 7.0–26.0)
CO2: 28 mEq/L (ref 22–29)
CREATININE: 0.8 mg/dL (ref 0.7–1.3)
Calcium: 9.1 mg/dL (ref 8.4–10.4)
Chloride: 92 mEq/L — ABNORMAL LOW (ref 98–109)
EGFR: >90 mL/min/{1.73_m2} (ref >90)
GLUCOSE: 112 mg/dL (ref 70–140)
POTASSIUM: 3.3 meq/L — AB (ref 3.5–5.1)
SODIUM: 131 meq/L — AB (ref 136–145)
TOTAL PROTEIN: 7.5 g/dL (ref 6.4–8.3)
Total Bilirubin: 0.38 mg/dL (ref 0.20–1.20)

## 2015-11-16 LAB — CBC & DIFF AND RETIC
BASO%: 0.7 % (ref 0.0–2.0)
BASOS ABS: 0 10*3/uL (ref 0.0–0.1)
EOS ABS: 0 10*3/uL (ref 0.0–0.5)
EOS%: 0.3 % (ref 0.0–7.0)
HEMATOCRIT: 38 % — AB (ref 38.4–49.9)
HEMOGLOBIN: 12.9 g/dL — AB (ref 13.0–17.1)
IMMATURE RETIC FRACT: 0 % — AB (ref 3.00–10.60)
LYMPH%: 17.6 % (ref 14.0–49.0)
MCH: 30 pg (ref 27.2–33.4)
MCHC: 33.9 g/dL (ref 32.0–36.0)
MCV: 88.4 fL (ref 79.3–98.0)
MONO#: 0.2 10*3/uL (ref 0.1–0.9)
MONO%: 7.8 % (ref 0.0–14.0)
NEUT%: 73.6 % (ref 39.0–75.0)
NEUTROS ABS: 2.2 10*3/uL (ref 1.5–6.5)
Platelets: 156 10*3/uL (ref 140–400)
RBC: 4.3 10*6/uL (ref 4.20–5.82)
RDW: 14.8 % — AB (ref 11.0–14.6)
RETIC %: 0.5 % — AB (ref 0.80–1.80)
RETIC CT ABS: 21.5 10*3/uL — AB (ref 34.80–93.90)
WBC: 3 10*3/uL — AB (ref 4.0–10.3)
lymph#: 0.5 10*3/uL — ABNORMAL LOW (ref 0.9–3.3)

## 2015-11-16 MED ORDER — OMEPRAZOLE 20 MG PO CPDR: 20.0000 mg | DELAYED_RELEASE_CAPSULE | Freq: Two times a day (BID) | ORAL | Status: DC

## 2015-11-16 MED ORDER — SUCRALFATE 1 GM/10ML PO SUSP: 1.0000 g | Freq: Three times a day (TID) | ORAL | Status: DC

## 2015-11-16 NOTE — Patient Instructions (Addendum)
[facility] Discharge Instructions  RECOMMENDATIONS MADE BY THE CONSULTANT AND ANY TEST RESULTS WILL BE SENT TO YOUR REFERRING PHYSICIAN.  EXAM FINDINGS BY THE PHYSICIAN TODAY AND SIGNS OR SYMPTOMS TO REPORT TO CLINIC OR PRIMARY PHYSICIAN:   MEDICATIONS PRESCRIBED:   Use your antacid twice daily as ordered (Prilosec)  INSTRUCTIONS GIVEN AND DISCUSSED:  Hold your BP med-Zestoretic (fluid pill)  Hold your Mobic for now till esophagus heals  Increase the Ensure to twice daily  Push fluids-try coconut water (Zico tastes good)  SPECIAL INSTRUCTIONS/FOLLOW-UP:  CT scan tomorrow as scheduled  Obtain FMLA froms from employer and ask about short term disability (for future)  Can take 2-4 weeks to recover from radiation therapy  Call for fever of 101 or greater  Thank you for choosing [facility] to provide your oncology and hematology care.  To afford each patient quality time with our providers, please arrive at least 30 minutes before your scheduled appointment time.  With your help, our goal is to use those 30 minutes to complete the necessary work-up to ensure our physicians have the information they need to help with your evaluation and healthcare recommendations.     ___________________  Should you have questions after your visit to [facility], please contact our office at [phone] between the hours of 8:30 a.m. and 4:30 p.m.  Voicemails left after 4:00 p.m. will not be returned until the following business day.  For prescription refill requests, have your pharmacy contact our office with your prescription refill request. We request 24 hour notice for all refill requests.

## 2015-11-16 NOTE — Telephone Encounter (Signed)
cld pt and left message of time & date of appt for 3/28

## 2015-11-16 NOTE — Progress Notes (Signed)
Oncology Nurse Navigator Documentation  Oncology Nurse Navigator Flowsheets 11/16/2015  Navigator Location [facility]-Med Onc  Navigator Encounter Type Follow-up Appt  Telephone -  Abnormal Finding Date 09/07/2015  Confirmed Diagnosis Date 09/07/2015  Treatment Initiated Date 10/11/2015  Patient Visit Type MedOnc  Treatment Phase Post-Tx Follow-up  Barriers/Navigation Needs Education;Financial  Education Concerns with Finances/ Eligibility; Symptom Management  Interventions Other;Coordination of Care;Education Method  Referrals -  Coordination of Care Other--composed letter for him to use for work to remain out this week-MD signed; obtained samples and coupons for Ensure from dietician and handout on what to eat when you have sore throat; suggested he speak with HR department at work regarding FMLA or short term disability forms  Education Method Verbal;Written;Teach-back  Support Groups/Services -  Acuity Level 1  Time Spent with Patient 45

## 2015-11-16 NOTE — Progress Notes (Signed)
[name] Kitchen    HEMATOLOGY/ONCOLOGY CLINIC NOTE  Date of Service: .11/16/2015  Patient Care Team: Leanna Battles, MD as PCP - General (Internal Medicine)  CHIEF COMPLAINTS/PURPOSE OF CONSULTATION:  Newly diagnosed Esophageal Squamous cell carcinoma   HISTORY OF PRESENTING ILLNESS: plz see my initial consultation regarding patients initial presentation  INTERVAL HISTORY  Mr Hyndman is here for an earlier than scheduled follow-up due to lethargy and feeling of fatigue. Patient notes that he was working full-time and was exposed to one of his colleagues who had a viral upper respiratory tract infection. Patient notes he had a day of fevers which have now resolved and has had a dry cough followed by some diarrhea which has now resolved. He notes that his dysphagia has improved some. Still some discomfort which is controlled with Magic mouthwash. Decreased appetite but more importantly he notes that during his work hours he doesn't get much time to eat. He notes that he will take some time off this week and focus on nutrition and good oral intake. He is scheduled for his restaging scans tomorrow. Noted to be somewhat dehydrated on labs and clinically and was offered IV fluids but notes that he is not nauseous at this time and we will focus on taking enough fluids orally. Potassium levels were low and he was recommended appropriate foods to take. Has lost about 5 pounds since anticipation of treatment. He was given a supply of ensure today to help boost his nutritional intake. No other acute new focal symptoms.   MEDICAL HISTORY:  Past Medical History  Diagnosis Date  . Hypertension   . Arthritis   . Esophageal cancer ([place])   . Knee pain     SURGICAL HISTORY: Past Surgical History  Procedure Laterality Date  . Knee surgery    . Lesion removal  1969    hip  . Esophagogastroduodenoscopy (egd) with esophageal dilation      and biopsy    SOCIAL HISTORY: Social History   Social History  .  Marital Status: Single    Spouse Name: N/A  . Number of Children: 2  . Years of Education: N/A   Occupational History  . Manufacturing industrial curtains    Social History Main Topics  . Smoking status: Current Every Day Smoker -- 0.25 packs/day for 20 years    Types: Cigarettes  . Smokeless tobacco: Never Used  . Alcohol Use: No  . Drug Use: Yes    Special: Marijuana     Comment: last smoked 1 week ago  . Sexual Activity: Yes   Other Topics Concern  . 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  FAMILY HISTORY: Family History  Problem Relation Age of Onset  . Colon cancer Neg Hx   . Esophageal cancer Father   . Cancer Father     nasopharyngeal   . Diabetes Sister   . Cancer Sister     multiple myeloma    ALLERGIES:  has No Known Allergies.  MEDICATIONS:  Current Outpatient Prescriptions  Medication Sig Dispense Refill  . amLODipine (NORVASC) 5 MG tablet Take 5  mg by mouth daily.    . Diphenhyd-Lidocaine-Nystatin (FIRST-BXN MOUTHWASH) SUSP Take 5 mLs by mouth 4 (four) times daily as needed (radiation related esophageal pain). 1 Bottle 2  . omeprazole (PRILOSEC) 20 MG capsule Take 1 capsule (20 mg total) by mouth 2 (two) times daily before a meal. 60 capsule 2  . ondansetron (ZOFRAN) 8 MG tablet Take 1 tablet (8 mg total) by mouth every 8 (eight) hours as needed for nausea. 30 tablet 3  . oxyCODONE (ROXICODONE) 5 MG/5ML solution Take 5-10 mLs (5-10 mg total) by mouth every 4 (four) hours as needed for moderate pain or severe pain. 200 mL 0  . sucralfate (CARAFATE) 1 GM/10ML suspension Take 10 mLs (1 g total) by mouth 4 (four) times daily -  with meals  and at bedtime. 420 mL 0  . VIAGRA 100 MG tablet Reported on 10/22/2015     No current facility-administered medications for this visit.    REVIEW OF SYSTEMS:    10 Point review of Systems was done is negative except as noted above.  PHYSICAL EXAMINATION: ECOG PERFORMANCE STATUS: 1 - Symptomatic but completely ambulatory  . Filed Vitals:   11/16/15 0829  BP: 113/72  Pulse: 68  Temp: 99.4 F (37.4 C)  Resp: 18   Filed Weights   11/16/15 0829  Weight: 122 lb 8 oz (55.566 kg)   .Body mass index is 18.08 kg/(m^2).  [name] Kitchen Wt Readings from Last 3 Encounters:  11/16/15 122 lb 8 oz (55.566 kg)  10/27/15 126 lb 3.2 oz (57.244 kg)  10/22/15 125 lb 3.2 oz (56.79 kg)    GENERAL:middle aged AAM ,alert, in no acute distress and comfortable SKIN: skin color, texture, turgor are normal, no rashes or significant lesions EYES: normal, conjunctiva are pink and non-injected, sclera clear OROPHARYNX:no exudate, no erythema and lips, buccal mucosa, and tongue normal  NECK: supple, no JVD, thyroid normal size, non-tender, without nodularity LYMPH:  no palpable lymphadenopathy in the cervical, axillary or inguinal LUNGS: clear to auscultation with normal respiratory effort HEART: regular rate & rhythm,  no murmurs and no lower extremity edema ABDOMEN: abdomen soft, non-tender, normoactive bowel sounds  Musculoskeletal: no cyanosis of digits and no clubbing  PSYCH: alert & oriented x 3 with fluent speech NEURO: no focal motor/sensory deficits  LABORATORY DATA:  I have reviewed the data as listed  . CBC Latest Ref Rng 11/16/2015 10/27/2015 10/13/2015  WBC 4.0 - 10.3 10e3/uL 3.0(L) 7.3 6.4  Hemoglobin 13.0 - 17.1 g/dL 12.9(L) 12.8(L) 12.2(L)  Hematocrit 38.4 - 49.9 % 38.0(L) 37.7(L) 36.6(L)  Platelets 140 - 400 10e3/uL 156 343 320   . CBC    Component Value Date/Time   WBC 3.0* 11/16/2015 0746   WBC 7.1 09/24/2015 1115   RBC 4.30 11/16/2015 0746   RBC 4.10* 09/24/2015 1115   HGB 12.9*  11/16/2015 0746   HGB 12.4* 09/24/2015 1115   HCT 38.0* 11/16/2015 0746   HCT 38.7* 09/24/2015 1115   PLT 156 11/16/2015 0746   PLT 395 09/24/2015 1115   MCV 88.4 11/16/2015 0746   MCV 94.4 09/24/2015 1115   MCH 30.0 11/16/2015 0746   MCH 30.2 09/24/2015 1115   MCHC 33.9 11/16/2015 0746   MCHC 32.0 09/24/2015 1115   RDW 14.8* 11/16/2015 0746   RDW 15.3 09/24/2015 1115   LYMPHSABS 0.5* 11/16/2015 0746   LYMPHSABS 2.6 09/24/2015 1115   MONOABS 0.2 11/16/2015 0746   MONOABS 0.5 09/24/2015 1115   EOSABS 0.0 11/16/2015 0746  EOSABS 0.2 09/24/2015 1115   BASOSABS 0.0 11/16/2015 0746   BASOSABS 0.0 09/24/2015 1115    CMP Latest Ref Rng 11/16/2015 10/27/2015 10/13/2015  Glucose 70 - 140 mg/dl 112 112 130  BUN 7.0 - 26.0 mg/dL 8.0 9.3 9.8  Creatinine 0.7 - 1.3 mg/dL 0.8 0.8 0.8  Sodium 136 - 145 mEq/L 131(L) 138 139  Potassium 3.5 - 5.1 mEq/L 3.3(L) 3.7 3.3(L)  CO2 22 - 29 mEq/L 28 29 28   Calcium 8.4 - 10.4 mg/dL 9.1 9.8 9.2  Total Protein 6.4 - 8.3 g/dL 7.5 7.8 7.5  Total Bilirubin 0.20 - 1.20 mg/dL 0.38 0.67 0.49  Alkaline Phos 40 - 150 U/L 68 60 81  AST 5 - 34 U/L 44(H) 20 23  ALT 0 - 55 U/L 18 10 13     RADIOGRAPHIC STUDIES: I have personally reviewed the radiological images as listed and agreed with the findings in the report. No results found.  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 S/p palliative radiation therapy to the esophagus and regional lymph nodes. He has tolerated treatment generally well except for some mild grade 2 radiation esophagitis.  2) HTN- blood pressure is lower today with some mild dehydration and due to weight loss . 3) Dysphagia due to esophageal SCC -improved.  4) Hypokalemia -due to poor oral intake and recent diarrhea. 5) grade 2 radiation  esophagitis. Symptoms fairly controlled with PPI, Magic mouthwash when necessary and sucralfate. 6) fatigue due to - recent viral upper respiratory infection with diarrhea and radiation therapy and mild dehydration. 7)dehydration due to diarrhea likely related to recent viral infection resolved  Plan  -Patient off of IV fluids today but prefers to continue oral hydration - he notes he will take some time off next week from work and focus on nutrition and recovering from his recent viral infection and radiation-related fatigue . --Given some additional cans of ensure and encouraged good oral intake and hydration. -Given a letter to get a few days off from work to recover from his radiation and recent viral infection associated fatigue. Given lower blood pressures we'll discontinue his list no purulent hydrochlorothiazide . If his blood pressure remains low we might have to discontinue his amlodipine as well. -CT scan of the chest abdomen pelvis for restaging is planned for 11/17/2015  -sucralfate, GI cocktail,PPI and prn oxycodone to address radiation esophagitis discomfort. - counseled to call us if he has recurrent fevers or worsening cough or is unable to maintain adequate hydration . -We discussed about intake of potassium rich foods .  RTC with Dr Irene Limbo in 3 weeks with rpt labs and CT chest abdomen pelvis to discuss further palliative treatment options.  I spent 25 minutes counseling the patient face to face. The total time spent in the appointment was 25 minutes and more than 50% was on counseling and direct patient cares.    Sullivan Lone MD [place] AAHIVMS [facility] Hematology/Oncology Physician [facility]  (Office):       [phone] (Work cell):  [phone] (Fax):           [phone]

## 2015-11-17 ENCOUNTER — Other Ambulatory Visit: Payer: 59

## 2015-11-17 ENCOUNTER — Encounter (HOSPITAL_COMMUNITY): Payer: Self-pay

## 2015-11-17 ENCOUNTER — Ambulatory Visit (HOSPITAL_COMMUNITY)
Admission: RE | Admit: 2015-11-17 | Discharge: 2015-11-17 | Disposition: A | Discharge status: Home / Self Care | Payer: 59 | Source: Ambulatory Visit | Attending: Hematology | Admitting: Hematology

## 2015-11-17 DIAGNOSIS — C155 Malignant neoplasm of lower third of esophagus: Secondary | ICD-10-CM

## 2015-11-17 DIAGNOSIS — R599 Enlarged lymph nodes, unspecified: Secondary | ICD-10-CM | POA: Diagnosis not present

## 2015-11-17 DIAGNOSIS — I700 Atherosclerosis of aorta: Secondary | ICD-10-CM | POA: Insufficient documentation

## 2015-11-17 DIAGNOSIS — C7951 Secondary malignant neoplasm of bone: Secondary | ICD-10-CM | POA: Insufficient documentation

## 2015-11-17 DIAGNOSIS — C787 Secondary malignant neoplasm of liver and intrahepatic bile duct: Secondary | ICD-10-CM | POA: Diagnosis present

## 2015-11-17 MED ORDER — IOHEXOL 300 MG/ML  SOLN
100.0000 mL | Freq: Once | INTRAMUSCULAR | Status: AC | PRN
  Administered 2015-11-17: 100 mL via INTRAVENOUS

## 2015-11-18 ENCOUNTER — Telehealth: Payer: Self-pay | Admitting: *Deleted

## 2015-11-18 NOTE — Telephone Encounter (Signed)
Call received from patient requesting navigator.  Transferred to extension [phone].

## 2015-11-19 ENCOUNTER — Ambulatory Visit: Payer: 59 | Admitting: Hematology

## 2015-11-23 ENCOUNTER — Telehealth: Payer: Self-pay | Admitting: *Deleted

## 2015-11-23 NOTE — Telephone Encounter (Signed)
"  My son had CT last week.  We were told we would receive results that night and have not.  Could we receive results called to [place] at [phone] or t he home number [phone] has an answering machine."  Will notify provider.

## 2015-11-24 ENCOUNTER — Ambulatory Visit: Payer: 59 | Admitting: Hematology

## 2015-11-24 ENCOUNTER — Other Ambulatory Visit: Payer: 59

## 2015-11-29 ENCOUNTER — Other Ambulatory Visit: Payer: Self-pay | Admitting: *Deleted

## 2015-11-30 ENCOUNTER — Other Ambulatory Visit (HOSPITAL_BASED_OUTPATIENT_CLINIC_OR_DEPARTMENT_OTHER): Payer: 59

## 2015-11-30 ENCOUNTER — Encounter: Payer: Self-pay | Admitting: *Deleted

## 2015-11-30 ENCOUNTER — Ambulatory Visit (HOSPITAL_BASED_OUTPATIENT_CLINIC_OR_DEPARTMENT_OTHER): Payer: 59 | Admitting: Hematology

## 2015-11-30 ENCOUNTER — Encounter: Payer: Self-pay | Admitting: Hematology

## 2015-11-30 VITALS — BP 147/84 | HR 86 | Temp 98.0°F | Resp 18 | Ht 69.0 in | Wt 125.7 lb

## 2015-11-30 DIAGNOSIS — C155 Malignant neoplasm of lower third of esophagus: Secondary | ICD-10-CM

## 2015-11-30 DIAGNOSIS — C787 Secondary malignant neoplasm of liver and intrahepatic bile duct: Secondary | ICD-10-CM | POA: Diagnosis not present

## 2015-11-30 DIAGNOSIS — C154 Malignant neoplasm of middle third of esophagus: Secondary | ICD-10-CM

## 2015-11-30 DIAGNOSIS — R1310 Dysphagia, unspecified: Secondary | ICD-10-CM | POA: Diagnosis not present

## 2015-11-30 LAB — COMPREHENSIVE METABOLIC PANEL
ALT: 20 U/L (ref 0–55)
ANION GAP: 10 meq/L (ref 3–11)
AST: 29 U/L (ref 5–34)
Albumin: 3.1 g/dL — ABNORMAL LOW (ref 3.5–5.0)
Alkaline Phosphatase: 78 U/L (ref 40–150)
BILIRUBIN TOTAL: 0.3 mg/dL (ref 0.20–1.20)
BUN: 7.4 mg/dL (ref 7.0–26.0)
CALCIUM: 9.6 mg/dL (ref 8.4–10.4)
CHLORIDE: 99 meq/L (ref 98–109)
CO2: 31 mEq/L — ABNORMAL HIGH (ref 22–29)
Creatinine: 0.7 mg/dL (ref 0.7–1.3)
GLUCOSE: 100 mg/dL (ref 70–140)
Potassium: 3.5 mEq/L (ref 3.5–5.1)
Sodium: 140 mEq/L (ref 136–145)
TOTAL PROTEIN: 7.9 g/dL (ref 6.4–8.3)

## 2015-11-30 LAB — CBC & DIFF AND RETIC
BASO%: 0.9 % (ref 0.0–2.0)
BASOS ABS: 0.1 10*3/uL (ref 0.0–0.1)
EOS ABS: 0.1 10*3/uL (ref 0.0–0.5)
EOS%: 2 % (ref 0.0–7.0)
HEMATOCRIT: 34.4 % — AB (ref 38.4–49.9)
HEMOGLOBIN: 11.2 g/dL — AB (ref 13.0–17.1)
IMMATURE RETIC FRACT: 10.6 % (ref 3.00–10.60)
LYMPH#: 1.5 10*3/uL (ref 0.9–3.3)
LYMPH%: 21.1 % (ref 14.0–49.0)
MCH: 29.6 pg (ref 27.2–33.4)
MCHC: 32.6 g/dL (ref 32.0–36.0)
MCV: 90.8 fL (ref 79.3–98.0)
MONO#: 0.6 10*3/uL (ref 0.1–0.9)
MONO%: 8.2 % (ref 0.0–14.0)
NEUT#: 4.7 10*3/uL (ref 1.5–6.5)
NEUT%: 67.8 % (ref 39.0–75.0)
PLATELETS: 485 10*3/uL — AB (ref 140–400)
RBC: 3.79 10*6/uL — ABNORMAL LOW (ref 4.20–5.82)
RDW: 14.9 % — AB (ref 11.0–14.6)
RETIC %: 1.22 % (ref 0.80–1.80)
RETIC CT ABS: 46.24 10*3/uL (ref 34.80–93.90)
WBC: 7 10*3/uL (ref 4.0–10.3)

## 2015-11-30 NOTE — Progress Notes (Signed)
Oncology Nurse Navigator Documentation  Oncology Nurse Navigator Flowsheets 11/30/2015  Navigator Location [facility]-Med [facility]  Navigator Encounter Type Follow-up Appt  Telephone -  Abnormal Finding Date -  Confirmed Diagnosis Date -  Treatment Initiated Date -  Patient Visit Type MedOnc  Treatment Phase Pre-Tx/Tx Discussion  Barriers/Navigation Needs Education;Financial  Education Actor Options;chemotherapy drugs and PACt;Concerns with Finances/ Eligibility  Interventions Referrals;Education Method  Referrals Social Work-questions about disability and coping with prognosis  Coordination of Care -Gave him coupons for [employer] products-  Education Method Verbal;Written  Support Groups/Services GI Support Group;Other--[facility] event calendar  Acuity Level 2  Acuity Level 2 Initial guidance, education and coordination as needed;Educational needs;Ongoing guidance and education throughout treatment as needed  Time Spent with Patient 45  Will be starting Taxol/Carboplatin every 3 weeks. He is resistant to getting a PAC, but will reconsider if IV sticks become a problem for him. Main concern is his job/insurance and probable need for disability in near future. Eating and swallowing better now-has gained 3 lb since last visit. Encouraged him to stay active by trying to walk 20-30 minutes/day. Will request his chemo on Fridays so he can come in on Saturday for his neulasta.

## 2015-12-01 ENCOUNTER — Telehealth: Payer: Self-pay | Admitting: *Deleted

## 2015-12-01 NOTE — Telephone Encounter (Signed)
Per staff message and POF I have scheduled appts. Advised scheduler of appts. JMW  

## 2015-12-01 NOTE — Progress Notes (Signed)
Samuel Kitchen    HEMATOLOGY/ONCOLOGY CLINIC NOTE  Date of Service: .  Patient Care Team: Leanna Battles, MD as PCP - General (Internal Medicine)  CHIEF COMPLAINTS/PURPOSE OF CONSULTATION:  Newly diagnosed Esophageal Squamous cell carcinoma   HISTORY OF PRESENTING ILLNESS: plz see my initial consultation regarding patients initial presentation  INTERVAL HISTORY  Samuel Henderson is here for follow-up after having completed his palliative radiation therapy. He had a restaging CT chest abdomen pelvis. He notes that his swallowing and dysphagia have significantly improved. No acute new focal symptoms. No fevers or chills. We discussed the CT results. He is accompanied by his brother and mother for this appointment. We talked about the palliative nature of further treatments and his goals of care. We talked about possible port placement. He has still been working full-time. Notes reasonable by mouth intake and has gained 3 pounds since his last clinic visit.  MEDICAL HISTORY:  Past Medical History  Diagnosis Date  . Hypertension   . Arthritis   . Knee pain   . Esophageal cancer [facility])     SURGICAL HISTORY: Past Surgical History  Procedure Laterality Date  . Knee surgery    . Lesion removal  1969    hip  . Esophagogastroduodenoscopy (egd) with esophageal dilation      and biopsy    SOCIAL HISTORY: Social History   Social History  . Marital Status: Single    Spouse Name: N/A  . Number of Children: 2  . Years of Education: N/A   Occupational History  . Manufacturing industrial curtains    Social History Main Topics  . Smoking status: Current Every Day Smoker -- 0.25 packs/day for 20 years    Types: Cigarettes  . Smokeless tobacco: Never Used  . Alcohol Use: No  . Drug Use: Yes    Special: Marijuana     Comment: last smoked 1 week ago  . Sexual Activity: Yes   Other Topics Concern  . 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  FAMILY HISTORY: Family History  Problem Relation Age of Onset  . Colon cancer Neg Hx   . Esophageal cancer Father   . Cancer Father     nasopharyngeal   . Diabetes Sister   . Cancer Sister     multiple myeloma    ALLERGIES:  has No Known Allergies.  MEDICATIONS:  Current Outpatient Prescriptions  Medication Sig Dispense Refill  . amLODipine (NORVASC) 5 MG tablet Take 5 mg by mouth daily.    . Diphenhyd-Lidocaine-Nystatin (FIRST-BXN MOUTHWASH) SUSP Take 5 mLs by mouth 4 (four) times daily as needed (radiation related esophageal pain). 1 Bottle 2  . omeprazole (PRILOSEC) 20 MG capsule Take 1 capsule (20 mg total) by mouth 2 (two) times daily before a meal. 60 capsule 2  . ondansetron (ZOFRAN) 8 MG tablet Take 1 tablet (8 mg total) by mouth every 8 (eight) hours as needed for nausea. 30 tablet 3  . oxyCODONE (ROXICODONE) 5 MG/5ML solution Take 5-10 mLs (5-10 mg total) by mouth every 4 (four) hours as needed for moderate pain or severe pain.  200 mL 0  . sucralfate (CARAFATE) 1 GM/10ML suspension Take 10 mLs (1 g total) by mouth 4 (four) times daily -  with meals and at bedtime. 420 mL 0  . VIAGRA 100 MG tablet Reported on 10/22/2015     No current facility-administered medications for this visit.    REVIEW OF SYSTEMS:    10 Point review of Systems was done is negative except as noted above.  PHYSICAL EXAMINATION: ECOG PERFORMANCE STATUS: 1 - Symptomatic but completely ambulatory  . Filed Vitals:   11/30/15 0809  BP: 147/84  Pulse: 86  Temp: 98 F (36.7 C)  Resp: 18   Filed Weights   11/30/15 0809  Weight: 125 lb 11.2 oz (57.017 kg)   .Body mass index is 18.55 kg/(m^2).  Samuel Kitchen Wt Readings from Last 3  Encounters:  11/30/15 125 lb 11.2 oz (57.017 kg)  11/16/15 122 lb 8 oz (55.566 kg)  10/27/15 126 lb 3.2 oz (57.244 kg)    GENERAL:middle aged AAM ,alert, in no acute distress and comfortable SKIN: skin color, texture, turgor are normal, no rashes or significant lesions EYES: normal, conjunctiva are pink and non-injected, sclera clear OROPHARYNX:no exudate, no erythema and lips, buccal mucosa, and tongue normal  NECK: supple, no JVD, thyroid normal size, non-tender, without nodularity LYMPH:  no palpable lymphadenopathy in the cervical, axillary or inguinal LUNGS: clear to auscultation with normal respiratory effort HEART: regular rate & rhythm,  no murmurs and no lower extremity edema ABDOMEN: abdomen soft, non-tender, normoactive bowel sounds  Musculoskeletal: no cyanosis of digits and no clubbing  PSYCH: alert & oriented x 3 with fluent speech NEURO: no focal motor/sensory deficits  LABORATORY DATA:  I have reviewed the data as listed  . CBC Latest Ref Rng 11/30/2015 11/16/2015 10/27/2015  WBC 4.0 - 10.3 10e3/uL 7.0 3.0(L) 7.3  Hemoglobin 13.0 - 17.1 g/dL 11.2(L) 12.9(L) 12.8(L)  Hematocrit 38.4 - 49.9 % 34.4(L) 38.0(L) 37.7(L)  Platelets 140 - 400 10e3/uL 485(H) 156 343   . CBC    Component Value Date/Time   WBC 7.0 11/30/2015 0734   WBC 7.1 09/24/2015 1115   RBC 3.79* 11/30/2015 0734   RBC 4.10* 09/24/2015 1115   HGB 11.2* 11/30/2015 0734   HGB 12.4* 09/24/2015 1115   HCT 34.4* 11/30/2015 0734   HCT 38.7* 09/24/2015 1115   PLT 485* 11/30/2015 0734   PLT 395 09/24/2015 1115   MCV 90.8 11/30/2015 0734   MCV 94.4 09/24/2015 1115   MCH 29.6 11/30/2015 0734   MCH 30.2 09/24/2015 1115   MCHC 32.6 11/30/2015 0734   MCHC 32.0 09/24/2015 1115   RDW 14.9* 11/30/2015 0734   RDW 15.3 09/24/2015 1115   LYMPHSABS 1.5 11/30/2015 0734   LYMPHSABS 2.6 09/24/2015 1115   MONOABS 0.6 11/30/2015 0734   MONOABS 0.5 09/24/2015 1115   EOSABS 0.1 11/30/2015 0734   EOSABS 0.2  09/24/2015 1115   BASOSABS 0.1 11/30/2015 0734   BASOSABS 0.0 09/24/2015 1115    CMP Latest Ref Rng 11/30/2015 11/16/2015 10/27/2015  Glucose 70 - 140 mg/dl 100 112 112  BUN 7.0 - 26.0 mg/dL 7.4 8.0 9.3  Creatinine 0.7 - 1.3 mg/dL 0.7 0.8 0.8  Sodium 136 - 145 mEq/L 140 131(L) 138  Potassium 3.5 - 5.1 mEq/L 3.5 3.3(L) 3.7  CO2 22 - 29 mEq/L 31(H) 28 29  Calcium 8.4 - 10.4 mg/dL 9.6 9.1 9.8  Total Protein 6.4 - 8.3 g/dL 7.9 7.5 7.8  Total Bilirubin 0.20 - 1.20 mg/dL 0.30 0.38  0.67  Alkaline Phos 40 - 150 U/L 78 68 60  AST 5 - 34 U/L 29 44(H) 20  ALT 0 - 55 U/L _0 RADIOGRAPHIC STUDIES: I have personally reviewed the radiological images as listed and agreed with the findings in the report. Ct Chest W Contrast  11/17/2015  CLINICAL DATA:  Restaging metastatic distal esophageal carcinoma status post radiation therapy. EXAM: CT CHEST, ABDOMEN, AND PELVIS WITH CONTRAST TECHNIQUE: Multidetector CT imaging of the chest, abdomen and pelvis was performed following the standard protocol during bolus administration of intravenous contrast. CONTRAST:  130m OMNIPAQUE IOHEXOL 300 MG/ML  SOLN COMPARISON:  PET-CT 09/29/2015.  CTs 09/09/2015. FINDINGS: CT CHEST Mediastinum/Nodes: Nodal evaluation is mildly limited by a paucity of body fat. 9 mm right supraclavicular node on image number 5 is grossly stable, hypermetabolic on PET-CT. Likewise, an 11 mm left superior mediastinal node on image 15 and a 7 mm left retro hilar node on image 34 have not significantly changed. No progressive adenopathy seen. Irregular wall thickening throughout the distal half of the esophagus has mildly improved. The heart size is normal. There is a stable small pericardial effusion anteriorly. There is stable atherosclerosis of the aorta, great vessels and coronary arteries. Lungs/Pleura: There is no pleural effusion. The lungs remain clear. Musculoskeletal/Chest wall: No chest wall mass or suspicious osseous findings. Stable  anterior bridging between the right fourth and fifth ribs. CT ABDOMEN AND PELVIS FINDINGS Hepatobiliary: Multifocal hepatic metastatic disease again demonstrated. Compared with the prior diagnostic CT, the lesions demonstrate increased central density consistent with necrosis and partial treatment. However, the disease has progressed. Index lesion in the dome of the liver measures 3.9 x 3.4 cm on image 52 (previously 2.2 x 1.8 cm). The lesion in segment 4A measures 5.0 x 3.7 cm on image 57 (previously 2.1 x 1.5 cm). There are at least 2 other smaller lesions in the right lobe. No evidence of gallstones, gallbladder wall thickening or biliary dilatation. Pancreas: Unremarkable. No pancreatic ductal dilatation or surrounding inflammatory changes. Spleen: There is a stable cyst superiorly within the spleen. The spleen otherwise appears unremarkable. Adrenals/Urinary Tract: Both adrenal glands appear normal. There is a stable small cyst in the interpolar region of the right kidney. No evidence of renal mass, hydronephrosis or urinary tract calculus. The bladder appears unremarkable. Stomach/Bowel: No evidence of bowel wall thickening, distention or surrounding inflammatory change. Vascular/Lymphatic: Nodal evaluation limited by the paucity of body fat. The dominant nodal mass within the gastrohepatic ligament appears larger, measuring 5.2 x 2.6 cm on image 58. On the initial diagnostic CT, this measured 3.3 x 2.8 cm. No other enlarged abdominal pelvic lymph nodes identified. Stable aortic and branch vessel atherosclerosis. Reproductive: Unremarkable. Other: No ascites or peritoneal nodularity identified. Musculoskeletal: No acute or significant osseous findings. There are stable degenerative changes within the lumbar spine associated with a scoliosis. IMPRESSION: 1. Interval improvement in diffuse wall thickening of the distal esophagus. 2. The small hypermetabolic thoracic lymph nodes on prior PET-CT have not  significantly changed in size. No evidence of disease progression in the chest. 3. The multifocal hepatic metastatic disease has progressed. Likewise, the dominant nodal mass in the gastrohepatic ligament has enlarged. This disease may be partially treated. 4. Diffuse atherosclerosis, as before. Electronically Signed   By: WRichardean SaleM.D.   On: 11/17/2015 10:39   Ct Abdomen Pelvis W Contrast  11/17/2015  CLINICAL DATA:  Restaging metastatic distal esophageal carcinoma status post radiation therapy. EXAM: CT  CHEST, ABDOMEN, AND PELVIS WITH CONTRAST TECHNIQUE: Multidetector CT imaging of the chest, abdomen and pelvis was performed following the standard protocol during bolus administration of intravenous contrast. CONTRAST:  158m OMNIPAQUE IOHEXOL 300 MG/ML  SOLN COMPARISON:  PET-CT 09/29/2015.  CTs 09/09/2015. FINDINGS: CT CHEST Mediastinum/Nodes: Nodal evaluation is mildly limited by a paucity of body fat. 9 mm right supraclavicular node on image number 5 is grossly stable, hypermetabolic on PET-CT. Likewise, an 11 mm left superior mediastinal node on image 15 and a 7 mm left retro hilar node on image 34 have not significantly changed. No progressive adenopathy seen. Irregular wall thickening throughout the distal half of the esophagus has mildly improved. The heart size is normal. There is a stable small pericardial effusion anteriorly. There is stable atherosclerosis of the aorta, great vessels and coronary arteries. Lungs/Pleura: There is no pleural effusion. The lungs remain clear. Musculoskeletal/Chest wall: No chest wall mass or suspicious osseous findings. 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 CT ABDOMEN AND PELVIS FINDINGS Hepatobiliary: Multifocal hepatic metastatic disease again demonstrated. Compared with the prior diagnostic CT, the lesions demonstrate increased central density consistent with necrosis and partial treatment. However, the disease has progressed. Index  lesion in the dome of the liver measures 3.9 x 3.4 cm on image 52 (previously 2.2 x 1.8 cm). The lesion in segment 4A measures 5.0 x 3.7 cm on image 57 (previously 2.1 x 1.5 cm). There are at least 2 other smaller lesions in the right lobe. No evidence of gallstones, gallbladder wall thickening or biliary dilatation. Pancreas: Unremarkable. No pancreatic ductal dilatation or surrounding inflammatory changes. Spleen: There is a stable cyst superiorly within the spleen. The spleen otherwise appears unremarkable. Adrenals/Urinary Tract: Both adrenal glands appear normal. There is a stable small cyst in the interpolar region of the right kidney. No evidence of renal mass, hydronephrosis or urinary tract calculus. The bladder appears unremarkable. Stomach/Bowel: No evidence of bowel wall thickening, distention or surrounding inflammatory change. Vascular/Lymphatic: Nodal evaluation limited by the paucity of body fat. The dominant nodal mass within the gastrohepatic ligament appears larger, measuring 5.2 x 2.6 cm on image 58. On the initial diagnostic CT, this measured 3.3 x 2.8 cm. No other enlarged abdominal pelvic lymph nodes identified. Stable aortic and branch vessel atherosclerosis. Reproductive: Unremarkable. Other: No ascites or peritoneal nodularity identified. Musculoskeletal: No acute or significant osseous findings. There are stable degenerative changes within the lumbar spine associated with a scoliosis. IMPRESSION: 1. Interval improvement in diffuse wall thickening of the distal esophagus. 2. The small hypermetabolic thoracic lymph nodes on prior PET-CT have not significantly changed in size. No evidence of disease progression in the chest. 3. The multifocal hepatic metastatic disease has progressed. Likewise, the dominant nodal mass in the gastrohepatic ligament has enlarged. This disease may be partially treated. 4. Diffuse atherosclerosis, as before. Electronically Signed   By: WRichardean SaleM.D.   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 S/p palliative radiation therapy to the esophagus and regional lymph nodes. He has tolerated treatment generally well except for some mild grade 2 radiation esophagitis which has now resolved.  Repeat CT chest abdomen pelvis done 11/17/2015 show progression of his liver metastases and gastrohepatic lymph node as expected.  He notes  his dysphagia is better and his imaging shows interval improvement in the distal esophageal wall thickening.  2) HTN- patient notes his appetite has improved and he is eating better. He is back on his amlodipine. 3) Dysphagia due to esophageal SCC -improved.  4) Hypokalemia -due to poor oral intake and recent diarrhea. Resolved 5) grade 2 radiation esophagitis. Symptoms fairly controlled with PPI, Magic mouthwash when necessary and sucralfate. Resolved 6) fatigue due to - recent viral upper respiratory infection with diarrhea and radiation therapy and mild dehydration. Resolved Plan We discussed the CT chest abdomen pelvis results and the expected progression of the liver metastases given he has not had metastasis directed chemotherapy at and just completed his palliative radiation therapy to his esophagus with improvement in his dysphagia. -We discussed various palliative treatment options and have chosen to proceed with carboplatin and Taxol with G-CSF support. We will start out with lower dose of Taxol at 175 mg meter squared and then escalate to 200 mg meter squared if tolerated. Carboplatin AUC of 5. IV fluids on day 1 and day 3 of chemotherapy. -We will plan to start chemotherapy around midweek next week after chemotherapy counseling. -Patient was educated about the pros and cons of port placement and  given handouts to read about this. He wants to think about this and let us know. He is hesitant since that might interfere with his work. --Chemotherapy counseling  -Nutrition support ongoing  -We will see him about 10-14 days after his first cycle of chemotherapy for toxicity screen. -He was given information about the individual medications. -Has not needed much pain medication at this time. -Appreciate help from the GI nurse navigator Merceda Elks RN. -Patient wants to talk to the social worker regarding his options to apply for social security disability as his energy wanes and he is unable to work further.  I spent 40 minutes counseling the patient face to face. The total time spent in the appointment was 40 minutes and more than 50% was on counseling and direct patient cares.    Sullivan Lone MD [place] AAHIVMS [facility] Hematology/Oncology Physician [facility]  (Office):       [phone] (Work cell):  [phone] (Fax):           [phone]

## 2015-12-02 ENCOUNTER — Telehealth: Payer: Self-pay | Admitting: Hematology

## 2015-12-02 ENCOUNTER — Telehealth: Payer: Self-pay | Admitting: *Deleted

## 2015-12-02 ENCOUNTER — Encounter: Payer: Self-pay | Admitting: *Deleted

## 2015-12-02 DIAGNOSIS — C787 Secondary malignant neoplasm of liver and intrahepatic bile duct: Secondary | ICD-10-CM

## 2015-12-02 DIAGNOSIS — C159 Malignant neoplasm of esophagus, unspecified: Secondary | ICD-10-CM

## 2015-12-02 NOTE — Telephone Encounter (Signed)
Mother Tamela Oddi called reporting "he has changed his mind about port-a-cath insertion.  After discussing this with his sister, he feels port-a-cath is best.  Also need clarification of voicemail about appointments that was filled with static."    Provided April appointment information answering questions.  Request for port-a-cath forwarded to provider.  No surgical preference and okay with IR and okay with first Treatment administered through peripheral vein.

## 2015-12-02 NOTE — Telephone Encounter (Signed)
lvm for pt regarding to April appt.... °

## 2015-12-02 NOTE — Progress Notes (Signed)
[place] Work  Clinical Social Work was referred by patient navigator for assistance with ss disability application process and adjustment to illness. CSW phoned pt and left supportive message and encouraged pt to return CSW call. CSW will attempt to see pt at next chemo appointment next week.    Loren Racer, [place] Worker [place] Phone: [phone] Fax: [phone]

## 2015-12-06 ENCOUNTER — Other Ambulatory Visit: Payer: Self-pay | Admitting: *Deleted

## 2015-12-06 DIAGNOSIS — C159 Malignant neoplasm of esophagus, unspecified: Secondary | ICD-10-CM

## 2015-12-07 ENCOUNTER — Other Ambulatory Visit: Payer: 59

## 2015-12-07 ENCOUNTER — Encounter: Payer: Self-pay | Admitting: *Deleted

## 2015-12-07 ENCOUNTER — Other Ambulatory Visit: Payer: Self-pay | Admitting: *Deleted

## 2015-12-07 ENCOUNTER — Other Ambulatory Visit (HOSPITAL_BASED_OUTPATIENT_CLINIC_OR_DEPARTMENT_OTHER): Payer: 59

## 2015-12-07 ENCOUNTER — Other Ambulatory Visit: Payer: Self-pay | Admitting: Radiology

## 2015-12-07 DIAGNOSIS — C787 Secondary malignant neoplasm of liver and intrahepatic bile duct: Secondary | ICD-10-CM

## 2015-12-07 DIAGNOSIS — C154 Malignant neoplasm of middle third of esophagus: Secondary | ICD-10-CM | POA: Diagnosis not present

## 2015-12-07 DIAGNOSIS — C159 Malignant neoplasm of esophagus, unspecified: Secondary | ICD-10-CM

## 2015-12-07 LAB — CBC & DIFF AND RETIC
BASO%: 0.6 % (ref 0.0–2.0)
Basophils Absolute: 0 10*3/uL (ref 0.0–0.1)
EOS%: 1.7 % (ref 0.0–7.0)
Eosinophils Absolute: 0.1 10*3/uL (ref 0.0–0.5)
HCT: 36.8 % — ABNORMAL LOW (ref 38.4–49.9)
HEMOGLOBIN: 12.1 g/dL — AB (ref 13.0–17.1)
Immature Retic Fract: 13.7 % — ABNORMAL HIGH (ref 3.00–10.60)
LYMPH%: 18.2 % (ref 14.0–49.0)
MCH: 29.9 pg (ref 27.2–33.4)
MCHC: 32.9 g/dL (ref 32.0–36.0)
MCV: 90.9 fL (ref 79.3–98.0)
MONO#: 0.7 10*3/uL (ref 0.1–0.9)
MONO%: 15.4 % — AB (ref 0.0–14.0)
NEUT%: 64.1 % (ref 39.0–75.0)
NEUTROS ABS: 3.1 10*3/uL (ref 1.5–6.5)
NRBC: 0 % (ref 0–0)
Platelets: 362 10*3/uL (ref 140–400)
RBC: 4.05 10*6/uL — AB (ref 4.20–5.82)
RDW: 15.7 % — AB (ref 11.0–14.6)
Retic %: 2.01 % — ABNORMAL HIGH (ref 0.80–1.80)
Retic Ct Abs: 81.41 10*3/uL (ref 34.80–93.90)
WBC: 4.8 10*3/uL (ref 4.0–10.3)
lymph#: 0.9 10*3/uL (ref 0.9–3.3)

## 2015-12-07 LAB — COMPREHENSIVE METABOLIC PANEL
ALBUMIN: 3.4 g/dL — AB (ref 3.5–5.0)
ALK PHOS: 87 U/L (ref 40–150)
ALT: 19 U/L (ref 0–55)
ANION GAP: 11 meq/L (ref 3–11)
AST: 33 U/L (ref 5–34)
BILIRUBIN TOTAL: 0.36 mg/dL (ref 0.20–1.20)
BUN: 6.5 mg/dL — ABNORMAL LOW (ref 7.0–26.0)
CO2: 32 mEq/L — ABNORMAL HIGH (ref 22–29)
CREATININE: 0.8 mg/dL (ref 0.7–1.3)
Calcium: 10 mg/dL (ref 8.4–10.4)
Chloride: 99 mEq/L (ref 98–109)
EGFR: >90 mL/min/{1.73_m2} (ref >90)
Glucose: 96 mg/dl (ref 70–140)
Potassium: 3.7 mEq/L (ref 3.5–5.1)
SODIUM: 142 meq/L (ref 136–145)
TOTAL PROTEIN: 8.3 g/dL (ref 6.4–8.3)

## 2015-12-07 MED ORDER — PROCHLORPERAZINE MALEATE 10 MG PO TABS
10.0000 mg | ORAL_TABLET | Freq: Four times a day (QID) | ORAL | Status: DC | PRN
Start: 2015-12-07 — End: 2016-01-03

## 2015-12-07 MED ORDER — DEXAMETHASONE 4 MG PO TABS: 8.0000 mg | ORAL_TABLET | Freq: Every day | ORAL | Status: DC

## 2015-12-07 MED ORDER — ONDANSETRON HCL 8 MG PO TABS
8.0000 mg | ORAL_TABLET | Freq: Two times a day (BID) | ORAL | Status: DC | PRN
Start: 2015-12-07 — End: 2016-01-18

## 2015-12-07 MED ORDER — LIDOCAINE-PRILOCAINE 2.5-2.5 % EX CREA: 1.0000 "application " | TOPICAL_CREAM | Freq: Once | CUTANEOUS | Status: AC

## 2015-12-07 MED ORDER — LORAZEPAM 0.5 MG PO TABS: 0.5000 mg | ORAL_TABLET | Freq: Four times a day (QID) | ORAL | Status: DC | PRN

## 2015-12-08 ENCOUNTER — Encounter (HOSPITAL_COMMUNITY): Payer: Self-pay

## 2015-12-08 ENCOUNTER — Other Ambulatory Visit: Payer: Self-pay | Admitting: Hematology

## 2015-12-08 ENCOUNTER — Ambulatory Visit (HOSPITAL_COMMUNITY)
Admission: RE | Admit: 2015-12-08 | Discharge: 2015-12-08 | Disposition: A | Discharge status: Home / Self Care | Payer: 59 | Source: Ambulatory Visit | Attending: Hematology | Admitting: Hematology

## 2015-12-08 ENCOUNTER — Ambulatory Visit: Payer: 59

## 2015-12-08 DIAGNOSIS — I10 Essential (primary) hypertension: Secondary | ICD-10-CM | POA: Insufficient documentation

## 2015-12-08 DIAGNOSIS — C159 Malignant neoplasm of esophagus, unspecified: Secondary | ICD-10-CM | POA: Diagnosis present

## 2015-12-08 DIAGNOSIS — Z79899 Other long term (current) drug therapy: Secondary | ICD-10-CM | POA: Diagnosis not present

## 2015-12-08 DIAGNOSIS — F17210 Nicotine dependence, cigarettes, uncomplicated: Secondary | ICD-10-CM | POA: Diagnosis not present

## 2015-12-08 DIAGNOSIS — Z791 Long term (current) use of non-steroidal anti-inflammatories (NSAID): Secondary | ICD-10-CM | POA: Diagnosis not present

## 2015-12-08 DIAGNOSIS — M1990 Unspecified osteoarthritis, unspecified site: Secondary | ICD-10-CM | POA: Insufficient documentation

## 2015-12-08 DIAGNOSIS — C787 Secondary malignant neoplasm of liver and intrahepatic bile duct: Secondary | ICD-10-CM

## 2015-12-08 DIAGNOSIS — Z923 Personal history of irradiation: Secondary | ICD-10-CM | POA: Diagnosis not present

## 2015-12-08 LAB — CBC WITH DIFFERENTIAL/PLATELET
BASOS ABS: 0 10*3/uL (ref 0.0–0.1)
BASOS PCT: 1 %
Eosinophils Absolute: 0.1 10*3/uL (ref 0.0–0.7)
Eosinophils Relative: 2 %
HEMATOCRIT: 33.8 % — AB (ref 39.0–52.0)
HEMOGLOBIN: 11.5 g/dL — AB (ref 13.0–17.0)
LYMPHS PCT: 23 %
Lymphs Abs: 1.3 10*3/uL (ref 0.7–4.0)
MCH: 29.9 pg (ref 26.0–34.0)
MCHC: 34 g/dL (ref 30.0–36.0)
MCV: 88 fL (ref 78.0–100.0)
Monocytes Absolute: 0.5 10*3/uL (ref 0.1–1.0)
Monocytes Relative: 10 %
NEUTROS ABS: 3.5 10*3/uL (ref 1.7–7.7)
NEUTROS PCT: 64 %
Platelets: 428 10*3/uL — ABNORMAL HIGH (ref 150–400)
RBC: 3.84 MIL/uL — AB (ref 4.22–5.81)
RDW: 15.7 % — AB (ref 11.5–15.5)
WBC: 5.4 10*3/uL (ref 4.0–10.5)

## 2015-12-08 LAB — APTT: APTT: 31 s (ref 24–37)

## 2015-12-08 LAB — PROTIME-INR
INR: 1.04 (ref 0.00–1.49)
Prothrombin Time: 13.4 seconds (ref 11.6–15.2)

## 2015-12-08 MED ORDER — MIDAZOLAM HCL 2 MG/2ML IJ SOLN
INTRAMUSCULAR | Status: AC
  Filled 2015-12-08: qty 4

## 2015-12-08 MED ORDER — CEFAZOLIN SODIUM-DEXTROSE 2-4 GM/100ML-% IV SOLN
2.0000 g | Freq: Once | INTRAVENOUS | Status: AC
  Administered 2015-12-08: 2 g via INTRAVENOUS
  Filled 2015-12-08: qty 100

## 2015-12-08 MED ORDER — SODIUM CHLORIDE 0.9 % IV SOLN
INTRAVENOUS | Status: DC
  Administered 2015-12-08: 12:00:00 via INTRAVENOUS

## 2015-12-08 MED ORDER — MIDAZOLAM HCL 2 MG/2ML IJ SOLN
INTRAMUSCULAR | Status: AC | PRN
  Administered 2015-12-08 (×4): 0.5 mg via INTRAVENOUS

## 2015-12-08 MED ORDER — HEPARIN SOD (PORK) LOCK FLUSH 100 UNIT/ML IV SOLN
INTRAVENOUS | Status: AC
  Filled 2015-12-08: qty 5

## 2015-12-08 MED ORDER — LIDOCAINE HCL 1 % IJ SOLN
INTRAMUSCULAR | Status: AC | PRN
  Administered 2015-12-08: 10 mL via INTRADERMAL

## 2015-12-08 MED ORDER — HEPARIN SOD (PORK) LOCK FLUSH 100 UNIT/ML IV SOLN
INTRAVENOUS | Status: AC | PRN
  Administered 2015-12-08: 500 [IU]

## 2015-12-08 MED ORDER — FENTANYL CITRATE (PF) 100 MCG/2ML IJ SOLN
INTRAMUSCULAR | Status: AC | PRN
  Administered 2015-12-08 (×3): 25 ug via INTRAVENOUS

## 2015-12-08 MED ORDER — FENTANYL CITRATE (PF) 100 MCG/2ML IJ SOLN
INTRAMUSCULAR | Status: AC
  Filled 2015-12-08: qty 2

## 2015-12-08 MED ORDER — LIDOCAINE HCL 1 % IJ SOLN
INTRAMUSCULAR | Status: AC
  Filled 2015-12-08: qty 20

## 2015-12-08 NOTE — Sedation Documentation (Signed)
Patient denies pain and is resting comfortably.  

## 2015-12-08 NOTE — Procedures (Signed)
Interventional Radiology Procedure Note  Procedure: Placement of a right IJ approach single lumen PowerPort.  Tip is positioned at the superior cavoatrial junction and catheter is ready for immediate use.  Complications: none Recommendations:  - Ok to shower tomorrow - Do not submerge for 7 days - Routine line care   Signed,  Dulcy Fanny. Earleen [place], DO

## 2015-12-08 NOTE — Discharge Instructions (Signed)
Implanted Port Insertion, Care After °Refer to this sheet in the next few weeks. These instructions provide you with information on caring for yourself after your procedure. Your health care provider may also give you more specific instructions. Your treatment has been planned according to current medical practices, but problems sometimes occur. Call your health care provider if you have any problems or questions after your procedure. °WHAT TO EXPECT AFTER THE PROCEDURE °After your procedure, it is typical to have the following:  °· Discomfort at the port insertion site. Ice packs to the area will help. °· Bruising on the skin over the port. This will subside in 3-4 days. °HOME CARE INSTRUCTIONS °· After your port is placed, you will get a manufacturer's information card. The card has information about your port. Keep this card with you at all times.   °· Know what kind of port you have. There are many types of ports available.   °· Wear a medical alert bracelet in case of an emergency. This can help alert health care workers that you have a port.   °· The port can stay in for as long as your health care provider believes it is necessary.   °· A home health care nurse may give medicines and take care of the port.   °· You or a family member can get special training and directions for giving medicine and taking care of the port at home.   °SEEK MEDICAL CARE IF:  °· Your port does not flush or you are unable to get a blood return.   °· You have a fever or chills. °SEEK IMMEDIATE MEDICAL CARE IF: °· You have new fluid or pus coming from your incision.   °· You notice a bad smell coming from your incision site.   °· You have swelling, pain, or more redness at the incision or port site.   °· You have chest pain or shortness of breath. °  °This information is not intended to replace advice given to you by your health care provider. Make sure you discuss any questions you have with your health care provider. °  °Document  Released: 06/11/2013 Document Revised: 08/26/2013 Document Reviewed: 06/11/2013 °Elsevier Interactive Patient Education ©2016 Elsevier Inc[employer] °Implanted Port Home Guide °An implanted port is a type of central line that is placed under the skin. Central lines are used to provide IV access when treatment or nutrition needs to be given through a person's veins. Implanted ports are used for long-term IV access. An implanted port may be placed because:  °· You need IV medicine that would be irritating to the small veins in your hands or arms.   °· You need long-term IV medicines, such as antibiotics.   °· You need IV nutrition for a long period.   °· You need frequent blood draws for lab tests.   °· You need dialysis.   °Implanted ports are usually placed in the chest area, but they can also be placed in the upper arm, the abdomen, or the leg. An implanted port has two main parts:  °· Reservoir. The reservoir is round and will appear as a small, raised area under your skin. The reservoir is the part where a needle is inserted to give medicines or draw blood.   °· Catheter. The catheter is a thin, flexible tube that extends from the reservoir. The catheter is placed into a large vein. Medicine that is inserted into the reservoir goes into the catheter and then into the vein.   °HOW WILL I CARE FOR MY INCISION SITE? °Do not get the   incision site wet. Bathe or shower as directed by your health care provider.  HOW IS MY PORT ACCESSED? Special steps must be taken to access the port:   Before the port is accessed, a numbing cream can be placed on the skin. This helps numb the skin over the port site.   Your health care provider uses a sterile technique to access the port.  Your health care provider must put on a mask and sterile gloves.  The skin over your port is cleaned carefully with an antiseptic and allowed to dry.  The port is gently pinched between sterile gloves, and a needle is inserted into the  port.  Only "non-coring" port needles should be used to access the port. Once the port is accessed, a blood return should be checked. This helps ensure that the port is in the vein and is not clogged.   If your port needs to remain accessed for a constant infusion, a clear (transparent) bandage will be placed over the needle site. The bandage and needle will need to be changed every week, or as directed by your health care provider.   Keep the bandage covering the needle clean and dry. Do not get it wet. Follow your health care provider's instructions on how to take a shower or bath while the port is accessed. May remove dressing and shower in 24 to 48 hours. Glue will flake off on its own.  If your port does not need to stay accessed, no bandage is needed over the port.  WHAT IS FLUSHING? Flushing helps keep the port from getting clogged. Follow your health care provider's instructions on how and when to flush the port. Ports are usually flushed with saline solution or a medicine called heparin. The need for flushing will depend on how the port is used.   If the port is used for intermittent medicines or blood draws, the port will need to be flushed:   After medicines have been given.   After blood has been drawn.   As part of routine maintenance.   If a constant infusion is running, the port may not need to be flushed.  HOW LONG WILL MY PORT STAY IMPLANTED? The port can stay in for as long as your health care provider thinks it is needed. When it is time for the port to come out, surgery will be done to remove it. The procedure is similar to the one performed when the port was put in.  WHEN SHOULD I SEEK IMMEDIATE MEDICAL CARE? When you have an implanted port, you should seek immediate medical care if:   You notice a bad smell coming from the incision site.   You have swelling, redness, or drainage at the incision site.   You have more swelling or pain at the port site or  the surrounding area.   You have a fever that is not controlled with medicine.   This information is not intended to replace advice given to you by your health care provider. Make sure you discuss any questions you have with your health care provider.   Document Released: 08/21/2005 Document Revised: 06/11/2013 Document Reviewed: 04/28/2013 Elsevier Interactive Patient Education 2016 Elsevier Inc. Moderate Conscious Sedation, Adult, Care After Refer to this sheet in the next few weeks. These instructions provide you with information on caring for yourself after your procedure. Your health care provider may also give you more specific instructions. Your treatment has been planned according to current medical practices, but problems  sometimes occur. Call your health care provider if you have any problems or questions after your procedure. WHAT TO EXPECT AFTER THE PROCEDURE  After your procedure:  You may feel sleepy, clumsy, and have poor balance for several hours.  Vomiting may occur if you eat too soon after the procedure. HOME CARE INSTRUCTIONS  Do not participate in any activities where you could become injured for at least 24 hours. Do not:  Drive.  Swim.  Ride a bicycle.  Operate heavy machinery.  Cook.  Use power tools.  Climb ladders.  Work from a high place.  Do not make important decisions or sign legal documents until you are improved.  If you vomit, drink water, juice, or soup when you can drink without vomiting. Make sure you have little or no nausea before eating solid foods.  Only take over-the-counter or prescription medicines for pain, discomfort, or fever as directed by your health care provider.  Make sure you and your family fully understand everything about the medicines given to you, including what side effects may occur.  You should not drink alcohol, take sleeping pills, or take medicines that cause drowsiness for at least 24 hours.  If you smoke,  do not smoke without supervision.  If you are feeling better, you may resume normal activities 24 hours after you were sedated.  Keep all appointments with your health care provider. SEEK MEDICAL CARE IF:  Your skin is pale or bluish in color.  You continue to feel nauseous or vomit.  Your pain is getting worse and is not helped by medicine.  You have bleeding or swelling.  You are still sleepy or feeling clumsy after 24 hours. SEEK IMMEDIATE MEDICAL CARE IF:  You develop a rash.  You have difficulty breathing.  You develop any type of allergic problem.  You have a fever. MAKE SURE YOU:  Understand these instructions.  Will watch your condition.  Will get help right away if you are not doing well or get worse.   This information is not intended to replace advice given to you by your health care provider. Make sure you discuss any questions you have with your health care provider.   Document Released: 06/11/2013 Document Revised: 09/11/2014 Document Reviewed: 06/11/2013 Elsevier Interactive Patient Education [employer].

## 2015-12-08 NOTE — H&P (Signed)
Chief Complaint: Patient was seen in consultation today for Port-A-Cath placement  Referring Physician(s): Brunetta Genera  Supervising Physician: Corrie Mckusick  History of Present Illness: Samuel Henderson is a 55 y.o. 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   Past Medical History  Diagnosis Date  . Hypertension   . Arthritis   . Knee pain   . Esophageal cancer [facility])     Past Surgical History  Procedure Laterality Date  . Knee surgery    . Lesion removal  1969    hip  . Esophagogastroduodenoscopy (egd) with esophageal dilation      and biopsy    Allergies: Review of patient's allergies indicates no known allergies.  Medications: Prior to Admission medications   Medication Sig Start Date End Date Taking? Authorizing Provider  amLODipine (NORVASC) 5 MG tablet Take 5 mg by mouth daily.   Yes Historical Provider, MD  Diphenhyd-Lidocaine-Nystatin (FIRST-BXN MOUTHWASH) SUSP Take 5 mLs by mouth 4 (four) times daily as needed (radiation related esophageal pain). 09/29/15  Yes Brunetta Genera, MD  meloxicam (MOBIC) 15 MG tablet Take 15 mg by mouth daily.   Yes Historical Provider, MD  omeprazole (PRILOSEC) 20 MG capsule Take 1 capsule (20 mg total) by mouth 2 (two) times daily before a meal. 11/16/15  Yes Brunetta Genera, MD  VIAGRA 100 MG tablet Reported on 10/22/2015 09/10/15  Yes Historical Provider, MD  dexamethasone (DECADRON) 4 MG tablet Take 2 tablets (8 mg total) by mouth daily. Start the day after chemotherapy for 4 days. 12/07/15   Brunetta Genera, MD  lidocaine-prilocaine (EMLA) cream Apply 1 application topically once. 12/07/15 12/06/16  Brunetta Genera, MD  LORazepam (ATIVAN) 0.5 MG tablet Take 1 tablet (0.5 mg  total) by mouth every 6 (six) hours as needed (Nausea or vomiting). 12/07/15   Brunetta Genera, MD  ondansetron (ZOFRAN) 8 MG tablet Take 1 tablet (8 mg total) by mouth every 8 (eight) hours as needed for nausea. 09/29/15   Brunetta Genera, MD  ondansetron (ZOFRAN) 8 MG tablet Take 1 tablet (8 mg total) by mouth 2 (two) times daily as needed for refractory nausea / vomiting. Start on day 3 after chemo. 12/07/15   Brunetta Genera, MD  oxyCODONE (ROXICODONE) 5 MG/5ML solution Take 5-10 mLs (5-10 mg total) by mouth every 4 (four) hours as needed for moderate pain or severe pain. 10/27/15   Brunetta Genera, MD  prochlorperazine (COMPAZINE) 10 MG tablet Take 1 tablet (10 mg total) by mouth every 6 (six) hours as needed (Nausea or vomiting). 12/07/15   Brunetta Genera, MD  sucralfate (CARAFATE) 1 GM/10ML suspension Take 10 mLs (1 g total) by mouth 4 (four) times daily -  with meals and at bedtime. 11/16/15   Brunetta Genera, MD     Family History  Problem Relation Age of Onset  . Colon cancer Neg Hx   . Esophageal cancer Father   . Cancer Father     nasopharyngeal   . Diabetes Sister   . Cancer Sister     multiple myeloma    Social History   Social History  . Marital Status: Single    Spouse Name: N/A  . Number of Children: 2  . Years of Education: N/A   Occupational History  .  Manufacturing industrial curtains    Social History Main Topics  . Smoking status: Current Every Day Smoker -- 0.25 packs/day for 20 years    Types: Cigarettes  . Smokeless tobacco: Never Used  . Alcohol Use: No  . Drug Use: Yes    Special: Marijuana     Comment: last smoked 1 week ago  . Sexual Activity: Yes   Other Topics Concern  . 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 Negative for fever and chills.  HENT: Positive for trouble swallowing.   Respiratory: Negative for shortness of breath.        Occ cough  Cardiovascular: Negative for chest pain.  Gastrointestinal: Negative for nausea, vomiting, abdominal pain and blood in stool.  Genitourinary: Negative for hematuria.  Musculoskeletal: Negative for back pain.  Neurological: Negative for headaches.    Vital Signs: BP 155/93 mmHg  Pulse 74  Temp(Src) 97.2 F (36.2 C) (Oral)  Resp 18  Ht _0  (1.753 m)  Wt 125 lb (56.7 kg)  BMI 18.45 kg/m2  SpO2 100%  Physical Exam  Constitutional: He is oriented to person, place, and time.  thin BM in NAD  Cardiovascular: Normal rate and regular rhythm.   Pulmonary/Chest: Effort normal.  Breath sounds distant but clear bilaterally  Abdominal: Soft. Bowel sounds are normal. There is no tenderness.  Musculoskeletal: He exhibits no edema.  Neurological: He is alert and oriented to person, place, and time.    Mallampati Score:     Imaging: Ct Chest W Contrast  11/17/2015  CLINICAL DATA:  Restaging metastatic distal esophageal carcinoma status post radiation therapy. EXAM: CT CHEST, ABDOMEN, AND PELVIS WITH CONTRAST TECHNIQUE: Multidetector CT imaging of the chest, abdomen and pelvis was performed following the standard protocol during bolus administration of intravenous contrast. CONTRAST:  178m OMNIPAQUE IOHEXOL 300 MG/ML  SOLN COMPARISON:  PET-CT 09/29/2015.  CTs 09/09/2015. FINDINGS: CT CHEST Mediastinum/Nodes: Nodal evaluation is mildly limited by a paucity of body fat. 9 mm right supraclavicular node on image number 5 is grossly stable, hypermetabolic on PET-CT. Likewise, an 11 mm left superior mediastinal node on image 15 and a 7 mm left retro hilar node on image 34 have not significantly changed. No progressive adenopathy seen. Irregular wall thickening throughout the distal half  of the esophagus has mildly improved. The heart size is normal. There is a stable small pericardial effusion anteriorly. There is stable atherosclerosis of the aorta, great vessels and coronary arteries. Lungs/Pleura: There is no pleural effusion. The lungs remain clear. Musculoskeletal/Chest wall: No chest wall mass or suspicious osseous findings. 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 CT ABDOMEN AND PELVIS FINDINGS Hepatobiliary: Multifocal hepatic metastatic disease again demonstrated. Compared with the prior diagnostic CT, the lesions demonstrate increased central density consistent with necrosis and partial treatment. However, the disease has progressed. Index lesion in the dome of the liver measures 3.9 x 3.4 cm on image 52 (previously 2.2 x 1.8 cm). The lesion in segment 4A measures 5.0 x 3.7 cm on image 57 (previously 2.1 x 1.5 cm). There are at least 2 other smaller lesions in the right lobe. No evidence of gallstones, gallbladder  wall thickening or biliary dilatation. Pancreas: Unremarkable. No pancreatic ductal dilatation or surrounding inflammatory changes. Spleen: There is a stable cyst superiorly within the spleen. The spleen otherwise appears unremarkable. Adrenals/Urinary Tract: Both adrenal glands appear normal. There is a stable small cyst in the interpolar region of the right kidney. No evidence of renal mass, hydronephrosis or urinary tract calculus. The bladder appears unremarkable. Stomach/Bowel: No evidence of bowel wall thickening, distention or surrounding inflammatory change. Vascular/Lymphatic: Nodal evaluation limited by the paucity of body fat. The dominant nodal mass within the gastrohepatic ligament appears larger, measuring 5.2 x 2.6 cm on image 58. On the initial diagnostic CT, this measured 3.3 x 2.8 cm. No other enlarged abdominal pelvic lymph nodes identified. Stable aortic and branch vessel atherosclerosis. Reproductive: Unremarkable. Other: No ascites  or peritoneal nodularity identified. Musculoskeletal: No acute or significant osseous findings. There are stable degenerative changes within the lumbar spine associated with a scoliosis. IMPRESSION: 1. Interval improvement in diffuse wall thickening of the distal esophagus. 2. The small hypermetabolic thoracic lymph nodes on prior PET-CT have not significantly changed in size. No evidence of disease progression in the chest. 3. The multifocal hepatic metastatic disease has progressed. Likewise, the dominant nodal mass in the gastrohepatic ligament has enlarged. This disease may be partially treated. 4. Diffuse atherosclerosis, as before. Electronically Signed   By: Richardean Sale M.D.   On: 11/17/2015 10:39   Ct Abdomen Pelvis W Contrast  11/17/2015  CLINICAL DATA:  Restaging metastatic distal esophageal carcinoma status post radiation therapy. EXAM: CT CHEST, ABDOMEN, AND PELVIS WITH CONTRAST TECHNIQUE: Multidetector CT imaging of the chest, abdomen and pelvis was performed following the standard protocol during bolus administration of intravenous contrast. CONTRAST:  183m OMNIPAQUE IOHEXOL 300 MG/ML  SOLN COMPARISON:  PET-CT 09/29/2015.  CTs 09/09/2015. FINDINGS: CT CHEST Mediastinum/Nodes: Nodal evaluation is mildly limited by a paucity of body fat. 9 mm right supraclavicular node on image number 5 is grossly stable, hypermetabolic on PET-CT. Likewise, an 11 mm left superior mediastinal node on image 15 and a 7 mm left retro hilar node on image 34 have not significantly changed. No progressive adenopathy seen. Irregular wall thickening throughout the distal half of the esophagus has mildly improved. The heart size is normal. There is a stable small pericardial effusion anteriorly. There is stable atherosclerosis of the aorta, great vessels and coronary arteries. Lungs/Pleura: There is no pleural effusion. The lungs remain clear. Musculoskeletal/Chest wall: No chest wall mass or suspicious osseous findings.  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 CT ABDOMEN AND PELVIS FINDINGS Hepatobiliary: Multifocal hepatic metastatic disease again demonstrated. Compared with the prior diagnostic CT, the lesions demonstrate increased central density consistent with necrosis and partial treatment. However, the disease has progressed. Index lesion in the dome of the liver measures 3.9 x 3.4 cm on image 52 (previously 2.2 x 1.8 cm). The lesion in segment 4A measures 5.0 x 3.7 cm on image 57 (previously 2.1 x 1.5 cm). There are at least 2 other smaller lesions in the right lobe. No evidence of gallstones, gallbladder wall thickening or biliary dilatation. Pancreas: Unremarkable. No pancreatic ductal dilatation or surrounding inflammatory changes. Spleen: There is a stable cyst superiorly within the spleen. The spleen otherwise appears unremarkable. Adrenals/Urinary Tract: Both adrenal glands appear normal. There is a stable small cyst in the interpolar region of the right kidney. No evidence of renal mass, hydronephrosis or urinary tract calculus. The bladder appears unremarkable. Stomach/Bowel: No evidence of bowel wall thickening,  distention or surrounding inflammatory change. Vascular/Lymphatic: Nodal evaluation limited by the paucity of body fat. The dominant nodal mass within the gastrohepatic ligament appears larger, measuring 5.2 x 2.6 cm on image 58. On the initial diagnostic CT, this measured 3.3 x 2.8 cm. No other enlarged abdominal pelvic lymph nodes identified. Stable aortic and branch vessel atherosclerosis. Reproductive: Unremarkable. Other: No ascites or peritoneal nodularity identified. Musculoskeletal: No acute or significant osseous findings. There are stable degenerative changes within the lumbar spine associated with a scoliosis. IMPRESSION: 1. Interval improvement in diffuse wall thickening of the distal esophagus. 2. The small hypermetabolic thoracic lymph nodes on prior PET-CT have not  significantly changed in size. No evidence of disease progression in the chest. 3. The multifocal hepatic metastatic disease has progressed. Likewise, the dominant nodal mass in the gastrohepatic ligament has enlarged. This disease may be partially treated. 4. Diffuse atherosclerosis, as before. Electronically Signed   By: Richardean Sale M.D.   On: 11/17/2015 10:39    Labs:  CBC:  Recent Labs  11/16/15 0746 11/30/15 0734 12/07/15 0852 12/08/15 1149  WBC 3.0* 7.0 4.8 5.4  HGB 12.9* 11.2* 12.1* 11.5*  HCT 38.0* 34.4* 36.8* 33.8*  PLT 156 485* 362 428*    COAGS:  Recent Labs  09/24/15 1115  INR 1.00    BMP:  Recent Labs  08/17/15 2148 09/02/15 1452 09/24/15 1115  10/27/15 0928 11/16/15 0746 11/30/15 0734 12/07/15 0852  NA 138 139 140  < > 138 131* 140 142  K 2.8* 3.6 3.7  < > 3.7 3.3* 3.5 3.7  CL 99* 97 100*  --   --   --   --   --   CO2 30 33* 28  < > 29 28 31* 32*  GLUCOSE 102* 86 94  < > 112 112 100 96  BUN _0 < > 9.3 8.0 7.4 6.5*  CALCIUM 9.3 9.8 9.9  < > 9.8 9.1 9.6 10.0  CREATININE 0.83 0.78 0.83  < > 0.8 0.8 0.7 0.8  GFRNONAA >60  --  >60  --   --   --   --   --   GFRAA >60  --  >60  --   --   --   --   --   < > = values in this interval not displayed.  LIVER FUNCTION TESTS:  Recent Labs  10/27/15 0928 11/16/15 0746 11/30/15 0734 12/07/15 0852  BILITOT 0.67 0.38 0.30 0.36  AST 20 44* 29 33  ALT _1 ALKPHOS 60 68 78 87  PROT 7.8 7.5 7.9 8.3  ALBUMIN 3.4* 3.4* 3.1* 3.4*    TUMOR MARKERS: No results for input(s): AFPTM, CEA, CA199, CHROMGRNA in the last 8760 hours.  Assessment and Plan: 55 y.o. 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     Thank you for this interesting consult.  I greatly enjoyed meeting KEIONTE SWICEGOOD and look forward to participating in their care.  A copy of this report was sent to the requesting provider on this date.  Electronically  Signed: D. Rowe Robert 12/08/2015, 12:24 PM   I spent a total of 20 minutes in face to face in clinical consultation, greater than 50% of which was counseling/coordinating care for Port-A-Cath placement

## 2015-12-09 ENCOUNTER — Ambulatory Visit
Admission: RE | Admit: 2015-12-09 | Discharge: 2015-12-09 | Disposition: A | Discharge status: Home / Self Care | Payer: 59 | Source: Ambulatory Visit | Attending: Radiation Oncology | Admitting: Radiation Oncology

## 2015-12-09 ENCOUNTER — Encounter: Payer: Self-pay | Admitting: Hematology

## 2015-12-09 ENCOUNTER — Ambulatory Visit (HOSPITAL_BASED_OUTPATIENT_CLINIC_OR_DEPARTMENT_OTHER): Payer: 59

## 2015-12-09 ENCOUNTER — Ambulatory Visit: Payer: 59 | Admitting: Nutrition

## 2015-12-09 ENCOUNTER — Encounter: Payer: Self-pay | Admitting: *Deleted

## 2015-12-09 ENCOUNTER — Encounter: Payer: Self-pay | Admitting: Radiation Oncology

## 2015-12-09 VITALS — BP 148/80 | HR 68 | Temp 98.0°F | Resp 18

## 2015-12-09 VITALS — BP 131/85 | HR 86 | Temp 97.8°F | Resp 16 | Wt 122.0 lb

## 2015-12-09 DIAGNOSIS — C159 Malignant neoplasm of esophagus, unspecified: Secondary | ICD-10-CM

## 2015-12-09 DIAGNOSIS — Z5111 Encounter for antineoplastic chemotherapy: Secondary | ICD-10-CM

## 2015-12-09 DIAGNOSIS — C787 Secondary malignant neoplasm of liver and intrahepatic bile duct: Secondary | ICD-10-CM

## 2015-12-09 DIAGNOSIS — C154 Malignant neoplasm of middle third of esophagus: Secondary | ICD-10-CM

## 2015-12-09 MED ORDER — PALONOSETRON HCL INJECTION 0.25 MG/5ML
0.2500 mg | Freq: Once | INTRAVENOUS | Status: AC
  Administered 2015-12-09: 0.25 mg via INTRAVENOUS

## 2015-12-09 MED ORDER — SODIUM CHLORIDE 0.9% FLUSH
10.0000 mL | INTRAVENOUS | Status: DC | PRN
  Administered 2015-12-09: 10 mL
  Filled 2015-12-09: qty 10

## 2015-12-09 MED ORDER — DIPHENHYDRAMINE HCL 50 MG/ML IJ SOLN
INTRAMUSCULAR | Status: AC
  Filled 2015-12-09: qty 1

## 2015-12-09 MED ORDER — FAMOTIDINE IN NACL 20-0.9 MG/50ML-% IV SOLN
INTRAVENOUS | Status: AC
  Filled 2015-12-09: qty 50

## 2015-12-09 MED ORDER — SODIUM CHLORIDE 0.9 % IV SOLN
175.0000 mg/m2 | Freq: Once | INTRAVENOUS | Status: AC
  Administered 2015-12-09: 294 mg via INTRAVENOUS
  Filled 2015-12-09: qty 49

## 2015-12-09 MED ORDER — SODIUM CHLORIDE 0.9 % IV SOLN
Freq: Once | INTRAVENOUS | Status: AC
  Administered 2015-12-09: 11:00:00 via INTRAVENOUS

## 2015-12-09 MED ORDER — HEPARIN SOD (PORK) LOCK FLUSH 100 UNIT/ML IV SOLN
500.0000 [IU] | Freq: Once | INTRAVENOUS | Status: AC | PRN
  Administered 2015-12-09: 500 [IU]
  Filled 2015-12-09: qty 5

## 2015-12-09 MED ORDER — SODIUM CHLORIDE 0.9 % IV SOLN
20.0000 mg | Freq: Once | INTRAVENOUS | Status: AC
  Administered 2015-12-09: 20 mg via INTRAVENOUS
  Filled 2015-12-09: qty 2

## 2015-12-09 MED ORDER — SODIUM CHLORIDE 0.9 % IV SOLN
540.5000 mg | Freq: Once | INTRAVENOUS | Status: AC
  Administered 2015-12-09: 540.5 mg via INTRAVENOUS
  Filled 2015-12-09: qty 54.05

## 2015-12-09 MED ORDER — PACLITAXEL CHEMO INJECTION 300 MG/50ML: 175.0000 mg/m2 | Freq: Once | INTRAVENOUS | Status: DC

## 2015-12-09 MED ORDER — SODIUM CHLORIDE 0.9 % IV SOLN
INTRAVENOUS | Status: AC
  Administered 2015-12-09: 11:00:00 via INTRAVENOUS

## 2015-12-09 MED ORDER — DIPHENHYDRAMINE HCL 50 MG/ML IJ SOLN
50.0000 mg | Freq: Once | INTRAMUSCULAR | Status: AC
  Administered 2015-12-09: 50 mg via INTRAVENOUS

## 2015-12-09 MED ORDER — PALONOSETRON HCL INJECTION 0.25 MG/5ML
INTRAVENOUS | Status: AC
  Filled 2015-12-09: qty 5

## 2015-12-09 MED ORDER — FAMOTIDINE IN NACL 20-0.9 MG/50ML-% IV SOLN
20.0000 mg | Freq: Once | INTRAVENOUS | Status: AC
  Administered 2015-12-09: 20 mg via INTRAVENOUS

## 2015-12-09 NOTE — Progress Notes (Signed)
  Oncology Nurse Navigator Documentation  Navigator Location: [facility]-Med Onc (12/09/15 1821) Navigator Encounter Type: Treatment (12/09/15 1821)         Treatment Initiated Date: 12/09/15 (12/09/15 1821) Patient Visit Type: MedOnc (12/09/15 1821) Treatment Phase: First Chemo Tx (12/09/15 1821) Barriers/Navigation Needs: No barriers at this time;No Questions;No Needs (12/09/15 1821)   Interventions: None required (12/09/15 1821)    Brief check in with patient during chemo today. Being seen also by dietician and CSW. He is doing well and port access went well. He has contact information to call if needed.

## 2015-12-09 NOTE — Progress Notes (Signed)
Samuel Henderson  Clinical Social Henderson was referred by patient navigator for assessment of psychosocial needs and assistance due to request to complete SS Disability Application.  Clinical Social Worker met with patient and his mother at [facility] during first chemo to offer support and assess for needs.  Pt currently employed and would like to try to [place] as long as possible. CSW reviewed guidelines for ss disability and that pt could not still be working to qualify. CSW provided list of needed documents if he would like to apply and CSW will keep checking on how things are going on this issue. CSW discussed other resources for support, grant funds and ACS. Pt reports to have good support from his family and one good friend that he feels he can talk with about his cancer. CSW educated pt and mother on common emotions when experiencing cancer diagnosis.   CSW made referral to [facility] for financial counseling and she will follow up with pt to see about medicaid application as well. CSW will follow and assist as needed.    Clinical Social Henderson interventions: Supportive Psychiatric nurse education and referral  Samuel Henderson, [place] Worker [place]  [place] Phone: [phone] Fax: [phone]

## 2015-12-09 NOTE — Patient Instructions (Addendum)
Your Anti-nausea regimen after chemotherapy treatment:  1. Dexamethasone 4 mg tablet : Take #2 tablets (8mg ) daily x 4 days--start on 4/7 (take early in am so it will keep you awake) 2. Ativan 0.5 mg tablet: Take one tablet (swallow or dissolve under tongue) every 6 hours as needed for nausea (will make you drowsy) 3. Compazine 10 mg tablet: Take one tablet every 6 hours as needed for nausea (will make you drowsy) 4. Zofran 8 mg tablet: Take on tablet twice daily as needed for nausea (starting on 12/12/15)  Paclitaxel injection What is this medicine? PACLITAXEL (PAK li TAX el) is a chemotherapy drug. It targets fast dividing cells, like cancer cells, and causes these cells to die. This medicine is used to treat ovarian cancer, breast cancer, and other cancers. This medicine may be used for other purposes; ask your health care provider or pharmacist if you have questions. What should I tell my health care provider before I take this medicine? They need to know if you have any of these conditions: -blood disorders -irregular heartbeat -infection (especially a virus infection such as chickenpox, cold sores, or herpes) -liver disease -previous or ongoing radiation therapy -an unusual or allergic reaction to paclitaxel, alcohol, polyoxyethylated castor oil, other chemotherapy agents, other medicines, foods, dyes, or preservatives -pregnant or trying to get pregnant -breast-feeding How should I use this medicine? This drug is given as an infusion into a vein. It is administered in a hospital or clinic by a specially trained health care professional. Talk to your pediatrician regarding the use of this medicine in children. Special care may be needed. Overdosage: If you think you have taken too much of this medicine contact a poison control center or emergency room at once. NOTE: This medicine is only for you. Do not share this medicine with others. What if I miss a dose? It is important not to miss  your dose. Call your doctor or health care professional if you are unable to keep an appointment. What may interact with this medicine? Do not take this medicine with any of the following medications: -disulfiram -metronidazole This medicine may also interact with the following medications: -cyclosporine -diazepam -ketoconazole -medicines to increase blood counts like filgrastim, pegfilgrastim, sargramostim -other chemotherapy drugs like cisplatin, doxorubicin, epirubicin, etoposide, teniposide, vincristine -quinidine -testosterone -vaccines -verapamil Talk to your doctor or health care professional before taking any of these medicines: -acetaminophen -aspirin -ibuprofen -ketoprofen -naproxen This list may not describe all possible interactions. Give your health care provider a list of all the medicines, herbs, non-prescription drugs, or dietary supplements you use. Also tell them if you smoke, drink alcohol, or use illegal drugs. Some items may interact with your medicine. What should I watch for while using this medicine? Your condition will be monitored carefully while you are receiving this medicine. You will need important blood work done while you are taking this medicine. This drug may make you feel generally unwell. This is not uncommon, as chemotherapy can affect healthy cells as well as cancer cells. Report any side effects. Continue your course of treatment even though you feel ill unless your doctor tells you to stop. This medicine can cause serious allergic reactions. To reduce your risk you will need to take other medicine(s) before treatment with this medicine. In some cases, you may be given additional medicines to help with side effects. Follow all directions for their use. Call your doctor or health care professional for advice if you get a fever, chills or sore  throat, or other symptoms of a cold or flu. Do not treat yourself. This drug decreases your body's ability to  fight infections. Try to avoid being around people who are sick. This medicine may increase your risk to bruise or bleed. Call your doctor or health care professional if you notice any unusual bleeding. Be careful brushing and flossing your teeth or using a toothpick because you may get an infection or bleed more easily. If you have any dental work done, tell your dentist you are receiving this medicine. Avoid taking products that contain aspirin, acetaminophen, ibuprofen, naproxen, or ketoprofen unless instructed by your doctor. These medicines may hide a fever. Do not become pregnant while taking this medicine. Women should inform their doctor if they wish to become pregnant or think they might be pregnant. There is a potential for serious side effects to an unborn child. Talk to your health care professional or pharmacist for more information. Do not breast-feed an infant while taking this medicine. Men are advised not to father a child while receiving this medicine. This product may contain alcohol. Ask your pharmacist or healthcare provider if this medicine contains alcohol. Be sure to tell all healthcare providers you are taking this medicine. Certain medicines, like metronidazole and disulfiram, can cause an unpleasant reaction when taken with alcohol. The reaction includes flushing, headache, nausea, vomiting, sweating, and increased thirst. The reaction can last from 30 minutes to several hours. What side effects may I notice from receiving this medicine? Side effects that you should report to your doctor or health care professional as soon as possible: -allergic reactions like skin rash, itching or hives, swelling of the face, lips, or tongue -low blood counts - This drug may decrease the number of white blood cells, red blood cells and platelets. You may be at increased risk for infections and bleeding. -signs of infection - fever or chills, cough, sore throat, pain or difficulty passing  urine -signs of decreased platelets or bleeding - bruising, pinpoint red spots on the skin, black, tarry stools, nosebleeds -signs of decreased red blood cells - unusually weak or tired, fainting spells, lightheadedness -breathing problems -chest pain -high or low blood pressure -mouth sores -nausea and vomiting -pain, swelling, redness or irritation at the injection site -pain, tingling, numbness in the hands or feet -slow or irregular heartbeat -swelling of the ankle, feet, hands Side effects that usually do not require medical attention (report to your doctor or health care professional if they continue or are bothersome): -bone pain -complete hair loss including hair on your head, underarms, pubic hair, eyebrows, and eyelashes -changes in the color of fingernails -diarrhea -loosening of the fingernails -loss of appetite -muscle or joint pain -red flush to skin -sweating This list may not describe all possible side effects. Call your doctor for medical advice about side effects. You may report side effects to FDA at 1-800-FDA-1088. Where should I keep my medicine? This drug is given in a hospital or clinic and will not be stored at home. NOTE: This sheet is a summary. It may not cover all possible information. If you have questions about this medicine, talk to your doctor, pharmacist, or health care provider.    2016, Elsevier/Gold Standard. (2015-04-08 13:02:56)  Carboplatin injection What is this medicine? CARBOPLATIN (KAR boe pla tin) is a chemotherapy drug. It targets fast dividing cells, like cancer cells, and causes these cells to die. This medicine is used to treat ovarian cancer and many other cancers. This medicine may be  used for other purposes; ask your health care provider or pharmacist if you have questions. What should I tell my health care provider before I take this medicine? They need to know if you have any of these conditions: -blood disorders -hearing  problems -kidney disease -recent or ongoing radiation therapy -an unusual or allergic reaction to carboplatin, cisplatin, other chemotherapy, other medicines, foods, dyes, or preservatives -pregnant or trying to get pregnant -breast-feeding How should I use this medicine? This drug is usually given as an infusion into a vein. It is administered in a hospital or clinic by a specially trained health care professional. Talk to your pediatrician regarding the use of this medicine in children. Special care may be needed. Overdosage: If you think you have taken too much of this medicine contact a poison control center or emergency room at once. NOTE: This medicine is only for you. Do not share this medicine with others. What if I miss a dose? It is important not to miss a dose. Call your doctor or health care professional if you are unable to keep an appointment. What may interact with this medicine? -medicines for seizures -medicines to increase blood counts like filgrastim, pegfilgrastim, sargramostim -some antibiotics like amikacin, gentamicin, neomycin, streptomycin, tobramycin -vaccines Talk to your doctor or health care professional before taking any of these medicines: -acetaminophen -aspirin -ibuprofen -ketoprofen -naproxen This list may not describe all possible interactions. Give your health care provider a list of all the medicines, herbs, non-prescription drugs, or dietary supplements you use. Also tell them if you smoke, drink alcohol, or use illegal drugs. Some items may interact with your medicine. What should I watch for while using this medicine? Your condition will be monitored carefully while you are receiving this medicine. You will need important blood work done while you are taking this medicine. This drug may make you feel generally unwell. This is not uncommon, as chemotherapy can affect healthy cells as well as cancer cells. Report any side effects. Continue your course  of treatment even though you feel ill unless your doctor tells you to stop. In some cases, you may be given additional medicines to help with side effects. Follow all directions for their use. Call your doctor or health care professional for advice if you get a fever, chills or sore throat, or other symptoms of a cold or flu. Do not treat yourself. This drug decreases your body's ability to fight infections. Try to avoid being around people who are sick. This medicine may increase your risk to bruise or bleed. Call your doctor or health care professional if you notice any unusual bleeding. Be careful brushing and flossing your teeth or using a toothpick because you may get an infection or bleed more easily. If you have any dental work done, tell your dentist you are receiving this medicine. Avoid taking products that contain aspirin, acetaminophen, ibuprofen, naproxen, or ketoprofen unless instructed by your doctor. These medicines may hide a fever. Do not become pregnant while taking this medicine. Women should inform their doctor if they wish to become pregnant or think they might be pregnant. There is a potential for serious side effects to an unborn child. Talk to your health care professional or pharmacist for more information. Do not breast-feed an infant while taking this medicine. What side effects may I notice from receiving this medicine? Side effects that you should report to your doctor or health care professional as soon as possible: -allergic reactions like skin rash, itching or hives,  swelling of the face, lips, or tongue -signs of infection - fever or chills, cough, sore throat, pain or difficulty passing urine -signs of decreased platelets or bleeding - bruising, pinpoint red spots on the skin, black, tarry stools, nosebleeds -signs of decreased red blood cells - unusually weak or tired, fainting spells, lightheadedness -breathing problems -changes in hearing -changes in  vision -chest pain -high blood pressure -low blood counts - This drug may decrease the number of white blood cells, red blood cells and platelets. You may be at increased risk for infections and bleeding. -nausea and vomiting -pain, swelling, redness or irritation at the injection site -pain, tingling, numbness in the hands or feet -problems with balance, talking, walking -trouble passing urine or change in the amount of urine Side effects that usually do not require medical attention (report to your doctor or health care professional if they continue or are bothersome): -hair loss -loss of appetite -metallic taste in the mouth or changes in taste This list may not describe all possible side effects. Call your doctor for medical advice about side effects. You may report side effects to FDA at 1-800-FDA-1088. Where should I keep my medicine? This drug is given in a hospital or clinic and will not be stored at home. NOTE: This sheet is a summary. It may not cover all possible information. If you have questions about this medicine, talk to your doctor, pharmacist, or health care provider.    2016, Elsevier/Gold Standard. (2007-11-26 14:38:05)  [facility] Discharge Instructions for Patients Receiving Chemotherapy  Today you received the following chemotherapy agents Taxol/Carboplatin.  To help prevent nausea and vomiting after your treatment, we encourage you to take your nausea medication as directed.   If you develop nausea and vomiting that is not controlled by your nausea medication, call the clinic.   BELOW ARE SYMPTOMS THAT SHOULD BE REPORTED IMMEDIATELY:  *FEVER GREATER THAN 100.5 F  *CHILLS WITH OR WITHOUT FEVER  NAUSEA AND VOMITING THAT IS NOT CONTROLLED WITH YOUR NAUSEA MEDICATION  *UNUSUAL SHORTNESS OF BREATH  *UNUSUAL BRUISING OR BLEEDING  TENDERNESS IN MOUTH AND THROAT WITH OR WITHOUT PRESENCE OF ULCERS  *URINARY PROBLEMS  *BOWEL  PROBLEMS  UNUSUAL RASH Items with * indicate a potential emergency and should be followed up as soon as possible.  Feel free to call the clinic you have any questions or concerns. The clinic phone number is [phone].  Please show the [place] at check-in to the Emergency Department and triage nurse.

## 2015-12-09 NOTE — Progress Notes (Signed)
Met with patient in treatment to introduce myself as Estate manager/land agent. Asked patient if he had financial questions or concerns. Patient states yes. Patient doesn't know if he will be able to return to work or not and is concerned about losing his insurance and still being able to receive care. I advised patient if that happens, there are other options such as applying for disability,Medicare, and Medicaid as well as financial assistance through [facility]. I also advised patient that if the insurance doesn't pay at 100% once his amounts had been met, we can apply for copay assistance through [facility] for his Neulasta. Patient has my card and will keep me updated on the status of his employment and insurance. Patient was very appreciative and was accompanied by his mother.

## 2015-12-09 NOTE — Progress Notes (Signed)
Weight and vitals stable. Denies pain. Reports occasional difficulty swallowing. States, "when I have trouble getting things to go down I drink water." Reports his appetite is wonderful but, he has difficulty finding foods to go down. Denies dry mouth. Denies sore or ulcerations in his mouth. Denies fatigue. Reports his power port was placed yesterday. Scheduled for his first chemotherapy today at 1045.  BP 131/85 mmHg  Pulse 86  Temp(Src) 97.8 F (36.6 C) (Oral)  Resp 16  Wt 122 lb (55.339 kg)  SpO2 97% Wt Readings from Last 3 Encounters:  12/09/15 122 lb (55.339 kg)  12/08/15 125 lb (56.7 kg)  11/30/15 125 lb 11.2 oz (57.017 kg)

## 2015-12-09 NOTE — Progress Notes (Signed)
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  No diagnosis found.  Interval Since Last Radiation:  1.5 months  Site/dose:     10/12/2015-10/22/2015: The distal esophagus was treated to 30 Gy in 10 fractions of 3 Gy  Narrative:  The patient returns today for routine follow-up. He has met with Dr.: A, and is going to be receiving systemic chemotherapy Taxol and carboplatin his most recent CT scan on 11/17/2015 revealed interval improvement in diffuse wall thickening of the distal esophagus, small thoracic lymph nodes are stable in size compared to his previous PET from January. No disease progression of the chest was identified. Multifocal hepatic metastatic disease have progressed and the dominant nodal mass in the gastrohepatic ligament is also enlarged.    He states that he is doing pretty well in terms of his dysphagia.  He states that he is a little bit nervous about moving forward with chemotherapy, and is also during filing for disability due to his cancer diagnosis. He requests traditional assistance with understanding the process and will be meeting with social work today. He is also interested in meeting with nutritionist again and would like more samples of boost. He has been eating eggs, soft foods like mashed potatoes and soups. He does describe some dysphagia still with water if he is drinking a large volume but is typically able to slow down and tolerate swallowing. He continues to use Carafate miracle mouthwash and a proton pump inhibitor and feels as though his symptoms since radiation has completed have continued to improve. He denies any chest pain or shortness of breath, fevers or chills. He denies any alcohol or bladder disturbances. He is not  experiencing any abdominal pain, nausea, vomiting. Complete review of systems is obtained and is otherwise negative.                        ALLERGIES:  has No Known Allergies.  Meds: Current Outpatient Prescriptions  Medication Sig Dispense Refill  . amLODipine (NORVASC) 5 MG tablet Take 5 mg by mouth daily.    [name] Kitchen dexamethasone (DECADRON) 4 MG tablet Take 2 tablets (8 mg total) by mouth daily. Start the day after chemotherapy for 4 days. 30 tablet 1  . Diphenhyd-Lidocaine-Nystatin (FIRST-BXN MOUTHWASH) SUSP Take 5 mLs by mouth 4 (four) times daily as needed (radiation related esophageal pain). 1 Bottle 2  . hydrocortisone (CORTEF) 20 MG tablet     . lidocaine-prilocaine (EMLA) cream Apply 1 application topically once. 30 g 1  . lisinopril-hydrochlorothiazide (PRINZIDE,ZESTORETIC) 10-12.5 MG tablet     . LORazepam (ATIVAN) 0.5 MG tablet Take 1 tablet (0.5 mg total) by mouth every 6 (six) hours as needed (Nausea or vomiting). 30 tablet 0  . meloxicam (MOBIC) 15 MG tablet Take 15 mg by mouth daily.    [name] Kitchen omeprazole (PRILOSEC) 20 MG capsule Take 1 capsule (20 mg total) by mouth 2 (two) times daily before a meal. 60 capsule 2  . ondansetron (ZOFRAN) 8 MG tablet Take 1 tablet (8 mg total) by mouth every 8 (eight) hours as needed for nausea. 30 tablet 3  . ondansetron (ZOFRAN) 8 MG tablet Take 1 tablet (8 mg total) by mouth 2 (two) times daily as needed for  refractory nausea / vomiting. Start on day 3 after chemo. 30 tablet 1  . oxyCODONE (ROXICODONE) 5 MG/5ML solution Take 5-10 mLs (5-10 mg total) by mouth every 4 (four) hours as needed for moderate pain or severe pain. 200 mL 0  . prochlorperazine (COMPAZINE) 10 MG tablet Take 1 tablet (10 mg total) by mouth every 6 (six) hours as needed (Nausea or vomiting). 30 tablet 1  . sucralfate (CARAFATE) 1 GM/10ML suspension Take 10 mLs (1 g total) by mouth 4 (four) times daily -  with meals and at bedtime. 420 mL 0  . VIAGRA 100 MG tablet Reported on 10/22/2015      No current facility-administered medications for this encounter.   Facility-Administered Medications Ordered in Other Encounters  Medication Dose Route Frequency Provider Last Rate Last Dose  . 0.9 %  sodium chloride infusion   Intravenous Continuous Darrell K Allred, PA-C 10 mL/hr at 12/08/15 1211      Physical Findings:  weight is 122 lb (55.339 kg). His oral temperature is 97.8 F (36.6 C). His blood pressure is 131/85 and his pulse is 86. His respiration is 16 and oxygen saturation is 97%.   Pain scale 0 out of 10 In general this is a well appearing African-American male in no acute distress. He's alert and oriented x4 and appropriate throughout the examination. Cardiopulmonary assessment is negative for acute distress and he exhibits normal effort. His skin is intact. He has a Port-A-Cath site is assessed and Tegaderm is placed over this, a Brown bandage is also over the site of where this tubing was sutured.   Lab Findings: Lab Results  Component Value Date   WBC 5.4 12/08/2015   WBC 4.8 12/07/2015   HGB 11.5* 12/08/2015   HGB 12.1* 12/07/2015   HCT 33.8* 12/08/2015   HCT 36.8* 12/07/2015   PLT 428* 12/08/2015   PLT 362 12/07/2015    Lab Results  Component Value Date   NA 142 12/07/2015   NA 140 09/24/2015   K 3.7 12/07/2015   K 3.7 09/24/2015   CHLORIDE 99 12/07/2015   CO2 32* 12/07/2015   CO2 28 09/24/2015   GLUCOSE 96 12/07/2015   GLUCOSE 94 09/24/2015   BUN 6.5* 12/07/2015   BUN 11 09/24/2015   CREATININE 0.8 12/07/2015   CREATININE 0.83 09/24/2015   BILITOT 0.36 12/07/2015   BILITOT 0.8 09/24/2015   ALKPHOS 87 12/07/2015   ALKPHOS 62 09/24/2015   AST 33 12/07/2015   AST 25 09/24/2015   ALT 19 12/07/2015   ALT 14* 09/24/2015   PROT 8.3 12/07/2015   PROT 7.9 09/24/2015   ALBUMIN 3.4* 12/07/2015   ALBUMIN 4.1 09/24/2015   CALCIUM 10.0 12/07/2015   CALCIUM 9.9 09/24/2015   ANIONGAP 11 12/07/2015   ANIONGAP 12 09/24/2015    Radiographic  Findings: Ct Chest W Contrast  11/17/2015  CLINICAL DATA:  Restaging metastatic distal esophageal carcinoma status post radiation therapy. EXAM: CT CHEST, ABDOMEN, AND PELVIS WITH CONTRAST TECHNIQUE: Multidetector CT imaging of the chest, abdomen and pelvis was performed following the standard protocol during bolus administration of intravenous contrast. CONTRAST:  169m OMNIPAQUE IOHEXOL 300 MG/ML  SOLN COMPARISON:  PET-CT 09/29/2015.  CTs 09/09/2015. FINDINGS: CT CHEST Mediastinum/Nodes: Nodal evaluation is mildly limited by a paucity of body fat. 9 mm right supraclavicular node on image number 5 is grossly stable, hypermetabolic on PET-CT. Likewise, an 11 mm left superior mediastinal node on image 15 and a 7 mm left retro hilar node on image  34 have not significantly changed. No progressive adenopathy seen. Irregular wall thickening throughout the distal half of the esophagus has mildly improved. The heart size is normal. There is a stable small pericardial effusion anteriorly. There is stable atherosclerosis of the aorta, great vessels and coronary arteries. Lungs/Pleura: There is no pleural effusion. The lungs remain clear. Musculoskeletal/Chest wall: No chest wall mass or suspicious osseous findings. 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 CT ABDOMEN AND PELVIS FINDINGS Hepatobiliary: Multifocal hepatic metastatic disease again demonstrated. Compared with the prior diagnostic CT, the lesions demonstrate increased central density consistent with necrosis and partial treatment. However, the disease has progressed. Index lesion in the dome of the liver measures 3.9 x 3.4 cm on image 52 (previously 2.2 x 1.8 cm). The lesion in segment 4A measures 5.0 x 3.7 cm on image 57 (previously 2.1 x 1.5 cm). There are at least 2 other smaller lesions in the right lobe. No evidence of gallstones, gallbladder wall thickening or biliary dilatation. Pancreas: Unremarkable. No pancreatic ductal  dilatation or surrounding inflammatory changes. Spleen: There is a stable cyst superiorly within the spleen. The spleen otherwise appears unremarkable. Adrenals/Urinary Tract: Both adrenal glands appear normal. There is a stable small cyst in the interpolar region of the right kidney. No evidence of renal mass, hydronephrosis or urinary tract calculus. The bladder appears unremarkable. Stomach/Bowel: No evidence of bowel wall thickening, distention or surrounding inflammatory change. Vascular/Lymphatic: Nodal evaluation limited by the paucity of body fat. The dominant nodal mass within the gastrohepatic ligament appears larger, measuring 5.2 x 2.6 cm on image 58. On the initial diagnostic CT, this measured 3.3 x 2.8 cm. No other enlarged abdominal pelvic lymph nodes identified. Stable aortic and branch vessel atherosclerosis. Reproductive: Unremarkable. Other: No ascites or peritoneal nodularity identified. Musculoskeletal: No acute or significant osseous findings. There are stable degenerative changes within the lumbar spine associated with a scoliosis. IMPRESSION: 1. Interval improvement in diffuse wall thickening of the distal esophagus. 2. The small hypermetabolic thoracic lymph nodes on prior PET-CT have not significantly changed in size. No evidence of disease progression in the chest. 3. The multifocal hepatic metastatic disease has progressed. Likewise, the dominant nodal mass in the gastrohepatic ligament has enlarged. This disease may be partially treated. 4. Diffuse atherosclerosis, as before. Electronically Signed   By: Richardean Sale M.D.   On: 11/17/2015 10:39   Ct Abdomen Pelvis W Contrast  11/17/2015  CLINICAL DATA:  Restaging metastatic distal esophageal carcinoma status post radiation therapy. EXAM: CT CHEST, ABDOMEN, AND PELVIS WITH CONTRAST TECHNIQUE: Multidetector CT imaging of the chest, abdomen and pelvis was performed following the standard protocol during bolus administration of  intravenous contrast. CONTRAST:  165m OMNIPAQUE IOHEXOL 300 MG/ML  SOLN COMPARISON:  PET-CT 09/29/2015.  CTs 09/09/2015. FINDINGS: CT CHEST Mediastinum/Nodes: Nodal evaluation is mildly limited by a paucity of body fat. 9 mm right supraclavicular node on image number 5 is grossly stable, hypermetabolic on PET-CT. Likewise, an 11 mm left superior mediastinal node on image 15 and a 7 mm left retro hilar node on image 34 have not significantly changed. No progressive adenopathy seen. Irregular wall thickening throughout the distal half of the esophagus has mildly improved. The heart size is normal. There is a stable small pericardial effusion anteriorly. There is stable atherosclerosis of the aorta, great vessels and coronary arteries. Lungs/Pleura: There is no pleural effusion. The lungs remain clear. Musculoskeletal/Chest wall: No chest wall mass or suspicious osseous findings. Stable anterior bridging between the  right fourth and fifth ribs. CT ABDOMEN AND PELVIS FINDINGS Hepatobiliary: Multifocal hepatic metastatic disease again demonstrated. Compared with the prior diagnostic CT, the lesions demonstrate increased central density consistent with necrosis and partial treatment. However, the disease has progressed. Index lesion in the dome of the liver measures 3.9 x 3.4 cm on image 52 (previously 2.2 x 1.8 cm). The lesion in segment 4A measures 5.0 x 3.7 cm on image 57 (previously 2.1 x 1.5 cm). There are at least 2 other smaller lesions in the right lobe. No evidence of gallstones, gallbladder wall thickening or biliary dilatation. Pancreas: Unremarkable. No pancreatic ductal dilatation or surrounding inflammatory changes. Spleen: There is a stable cyst superiorly within the spleen. The spleen otherwise appears unremarkable. Adrenals/Urinary Tract: Both adrenal glands appear normal. There is a stable small cyst in the interpolar region of the right kidney. No evidence of renal mass, hydronephrosis or urinary tract  calculus. The bladder appears unremarkable. Stomach/Bowel: No evidence of bowel wall thickening, distention or surrounding inflammatory change. Vascular/Lymphatic: Nodal evaluation limited by the paucity of body fat. The dominant nodal mass within the gastrohepatic ligament appears larger, measuring 5.2 x 2.6 cm on image 58. On the initial diagnostic CT, this measured 3.3 x 2.8 cm. No other enlarged abdominal pelvic lymph nodes identified. Stable aortic and branch vessel atherosclerosis. Reproductive: Unremarkable. Other: No ascites or peritoneal nodularity identified. Musculoskeletal: No acute or significant osseous findings. There are stable degenerative changes within the lumbar spine associated with a scoliosis. IMPRESSION: 1. Interval improvement in diffuse wall thickening of the distal esophagus. 2. The small hypermetabolic thoracic lymph nodes on prior PET-CT have not significantly changed in size. No evidence of disease progression in the chest. 3. The multifocal hepatic metastatic disease has progressed. Likewise, the dominant nodal mass in the gastrohepatic ligament has enlarged. This disease may be partially treated. 4. Diffuse atherosclerosis, as before. Electronically Signed   By: Richardean Sale M.D.   On: 11/17/2015 10:39   Ir Fluoro Guide Cv Line Right  12/08/2015  INDICATION: 55 year old male with a history of esophageal carcinoma EXAM: IMPLANTED PORT A CATH PLACEMENT WITH ULTRASOUND AND FLUOROSCOPIC GUIDANCE MEDICATIONS: 2.0 g Ancef; The antibiotic was administered within an appropriate time interval prior to skin puncture. ANESTHESIA/SEDATION: Moderate (conscious) sedation was employed during this procedure. A total of Versed 2.0 mg and Fentanyl 75 mcg was administered intravenously. Moderate Sedation Time: 24 minutes. The patient's level of consciousness and vital signs were monitored continuously by radiology nursing throughout the procedure under my direct supervision. FLUOROSCOPY TIME:  Zero  minutes, 6 seconds (1 mGy) COMPLICATIONS: None PROCEDURE: The procedure, risks, benefits, and alternatives were explained to the patient. Questions regarding the procedure were encouraged and answered. The patient understands and consents to the procedure. Ultrasound survey was performed with images stored and sent to PACs. The right neck and chest was prepped with chlorhexidine, and draped in the usual sterile fashion using maximum barrier technique (cap and mask, sterile gown, sterile gloves, large sterile sheet, hand hygiene and cutaneous antiseptic). Antibiotic prophylaxis was provided with 2.0g Ancef administered IV one hour prior to skin incision. Local anesthesia was attained by infiltration with 1% lidocaine without epinephrine. Ultrasound demonstrated patency of the right internal jugular vein, and this was documented with an image. Under real-time ultrasound guidance, this vein was accessed with a 21 gauge micropuncture needle and image documentation was performed. A small dermatotomy was made at the access site with an 11 scalpel. A 0.018" wire was advanced into the SVC  and used to estimate the length of the internal catheter. The access needle exchanged for a 70F micropuncture vascular sheath. The 0.018" wire was then removed and a 0.035" wire advanced into the IVC. An appropriate location for the subcutaneous reservoir was selected below the clavicle and an incision was made through the skin and underlying soft tissues. The subcutaneous tissues were then dissected using a combination of blunt and sharp surgical technique and a pocket was formed. A single lumen power injectable portacatheter was then tunneled through the subcutaneous tissues from the pocket to the dermatotomy and the port reservoir placed within the subcutaneous pocket. The venous access site was then serially dilated and a peel away vascular sheath placed over the wire. The wire was removed and the port catheter advanced into position  under fluoroscopic guidance. The catheter tip is positioned in the cavoatrial junction. This was documented with a spot image. The portacatheter was then tested and found to flush and aspirate well. The port was flushed with saline followed by 100 units/mL heparinized saline. The pocket was then closed in two layers using first subdermal inverted interrupted absorbable sutures followed by a running subcuticular suture. The epidermis was then sealed with Dermabond. The dermatotomy at the venous access site was also seal with Dermabond. Patient tolerated the procedure well and remained hemodynamically stable throughout. No complications encountered and no significant blood loss encountered IMPRESSION: Status post right IJ port catheter placement. Catheter ready for use. Signed, Dulcy Fanny. Earleen Newport, DO [facility] Radiology Electronically Signed   By: Corrie Mckusick D.O.   On: 12/08/2015 15:58   Ir US Guide Vasc Access Left  12/08/2015  INDICATION: 55 year old male with a history of esophageal carcinoma EXAM: IMPLANTED PORT A CATH PLACEMENT WITH ULTRASOUND AND FLUOROSCOPIC GUIDANCE MEDICATIONS: 2.0 g Ancef; The antibiotic was administered within an appropriate time interval prior to skin puncture. ANESTHESIA/SEDATION: Moderate (conscious) sedation was employed during this procedure. A total of Versed 2.0 mg and Fentanyl 75 mcg was administered intravenously. Moderate Sedation Time: 24 minutes. The patient's level of consciousness and vital signs were monitored continuously by radiology nursing throughout the procedure under my direct supervision. FLUOROSCOPY TIME:  Zero minutes, 6 seconds (1 mGy) COMPLICATIONS: None PROCEDURE: The procedure, risks, benefits, and alternatives were explained to the patient. Questions regarding the procedure were encouraged and answered. The patient understands and consents to the procedure. Ultrasound survey was performed with images stored  and sent to PACs. The right neck and chest was prepped with chlorhexidine, and draped in the usual sterile fashion using maximum barrier technique (cap and mask, sterile gown, sterile gloves, large sterile sheet, hand hygiene and cutaneous antiseptic). Antibiotic prophylaxis was provided with 2.0g Ancef administered IV one hour prior to skin incision. Local anesthesia was attained by infiltration with 1% lidocaine without epinephrine. Ultrasound demonstrated patency of the right internal jugular vein, and this was documented with an image. Under real-time ultrasound guidance, this vein was accessed with a 21 gauge micropuncture needle and image documentation was performed. A small dermatotomy was made at the access site with an 11 scalpel. A 0.018" wire was advanced into the SVC and used to estimate the length of the internal catheter. The access needle exchanged for a 70F micropuncture vascular sheath. The 0.018" wire was then removed and a 0.035" wire advanced into the IVC. An appropriate location for the subcutaneous reservoir was selected below the clavicle and an incision was made through the skin and underlying soft tissues. The subcutaneous  tissues were then dissected using a combination of blunt and sharp surgical technique and a pocket was formed. A single lumen power injectable portacatheter was then tunneled through the subcutaneous tissues from the pocket to the dermatotomy and the port reservoir placed within the subcutaneous pocket. The venous access site was then serially dilated and a peel away vascular sheath placed over the wire. The wire was removed and the port catheter advanced into position under fluoroscopic guidance. The catheter tip is positioned in the cavoatrial junction. This was documented with a spot image. The portacatheter was then tested and found to flush and aspirate well. The port was flushed with saline followed by 100 units/mL heparinized saline. The pocket was then closed in two  layers using first subdermal inverted interrupted absorbable sutures followed by a running subcuticular suture. The epidermis was then sealed with Dermabond. The dermatotomy at the venous access site was also seal with Dermabond. Patient tolerated the procedure well and remained hemodynamically stable throughout. No complications encountered and no significant blood loss encountered IMPRESSION: Status post right IJ port catheter placement. Catheter ready for use. Signed, Dulcy Fanny. Earleen Newport, DO [facility] Radiology Electronically Signed   By: Corrie Mckusick D.O.   On: 12/08/2015 15:58    Impression/Plan: 1. Stage IV T2,N1, M1, squamous cell carcinoma the esophagus. The patient completed palliative radiotherapy, and his symptoms of radiation induced esophagitis appear to be improving. He is able to take and soft foods, however does experience some dysphagia liquids. He states that he does use Carafate, miracle mouthwash, and a PPI. His symptoms seem to be improving per report. He continues systemic therapy with Dr. Irene Limbo. He is beginning systemic Taxol and carboplatin today. I've encouraged him to continue to keep [place] informed of any changes that he experiences her concerns with progressive symptoms from esophagitis. He states agreement and understanding of this. 2. Routine calorie malnutrition. The patient will follow-up with her nutrition is Ernestene Kiel, and is interested in more supplements of boost. I will contact her he informed of this. 3. Social needs. Patient will be meeting with social work team today, to discuss obtaining resources, and he is interested as well in talking with him about possibly filing for disability.     Carola Rhine, PAC

## 2015-12-09 NOTE — Progress Notes (Signed)
Nutrition follow-up completed with patient during infusion for esophageal cancer. Weight decreased and documented as 122 pounds April 6 decreased from 124.2 pounds January 20. Patient reports his appetite is good. He is eating about the same as he was before. He enjoys oral nutrition supplements and prefers boost over ensure. He continues to have some difficulty swallowing. Wife is interested in strategies to prepare foods that are easier swallow.  Nutrition diagnosis: Unintended weight loss continues.  Intervention: Patient educated to continue meals and snacks consuming high-calorie, high-protein foods. Educated patient's wife on food preparation and allowing patient try a variety of foods as tolerated. Recommended patient consume oral nutrition supplements 2-3 times daily. Provided samples of boost. Questions were answered.  Teach back method used.  Monitoring, evaluation, goals: Patient will work to increase calories and protein to minimize further weight loss.  Next visit: Tuesday, April 25, during infusion.  **Disclaimer: This note was dictated with voice recognition software. Similar sounding words can inadvertently be transcribed and this note may contain transcription errors which may not have been corrected upon publication of note.**

## 2015-12-09 NOTE — Progress Notes (Signed)
Patient tolerated Taxol and Carboplatin treatment today with no complications.  All questions regarding PAC, Neulasta injection, and anti-nausea medications answered.  Pt reports no further questions at this time.  Pt to come in Saturday AM at 0900 for Neulasta injection.  Pt verbalized understanding.  Pt discharged ambulatory with no complaints accompanied by his mother.

## 2015-12-10 ENCOUNTER — Ambulatory Visit: Payer: 59

## 2015-12-10 ENCOUNTER — Telehealth: Payer: Self-pay

## 2015-12-10 ENCOUNTER — Other Ambulatory Visit: Payer: Self-pay | Admitting: *Deleted

## 2015-12-10 NOTE — Progress Notes (Signed)
Patient declined Day 3 IV fluids. MD [facility] informed. Instructed patient to continue increase fluid intake and let us know if any issues arise. Patient verbalized understanding.

## 2015-12-10 NOTE — Telephone Encounter (Signed)
Attempted to call pt, answering machine cut off. He was not at work.

## 2015-12-10 NOTE — Telephone Encounter (Signed)
-----   Message from Cheree Ditto, RN sent at 12/09/2015  5:14 PM EDT ----- Regarding: 1st time Chemotherapy; Taxol/Carboplatin; Dr. Irene Limbo Contact: [phone] Pt tolerated first Taxol/Carboplatin without complications.  Pt discharged without any further questions.  Pt of Dr. Irene Limbo.

## 2015-12-11 ENCOUNTER — Ambulatory Visit (HOSPITAL_BASED_OUTPATIENT_CLINIC_OR_DEPARTMENT_OTHER): Payer: 59

## 2015-12-11 VITALS — BP 134/79 | HR 77 | Temp 98.2°F | Resp 18

## 2015-12-11 DIAGNOSIS — C787 Secondary malignant neoplasm of liver and intrahepatic bile duct: Secondary | ICD-10-CM | POA: Diagnosis not present

## 2015-12-11 DIAGNOSIS — C154 Malignant neoplasm of middle third of esophagus: Secondary | ICD-10-CM | POA: Diagnosis not present

## 2015-12-11 MED ORDER — HEPARIN SOD (PORK) LOCK FLUSH 100 UNIT/ML IV SOLN
500.0000 [IU] | Freq: Once | INTRAVENOUS | Status: AC
  Administered 2015-12-11: 500 [IU] via INTRAVENOUS
  Filled 2015-12-11: qty 5

## 2015-12-11 MED ORDER — SODIUM CHLORIDE 0.9 % IV SOLN
INTRAVENOUS | Status: AC
  Administered 2015-12-11: 09:00:00 via INTRAVENOUS

## 2015-12-11 MED ORDER — SODIUM CHLORIDE 0.9% FLUSH
10.0000 mL | INTRAVENOUS | Status: DC | PRN
  Administered 2015-12-11: 10 mL via INTRAVENOUS
  Filled 2015-12-11: qty 10

## 2015-12-11 MED ORDER — PEGFILGRASTIM INJECTION 6 MG/0.6ML ~~LOC~~
6.0000 mg | PREFILLED_SYRINGE | Freq: Once | SUBCUTANEOUS | Status: AC
  Administered 2015-12-11: 6 mg via SUBCUTANEOUS

## 2015-12-11 NOTE — Patient Instructions (Signed)
Pegfilgrastim injection What is this medicine? PEGFILGRASTIM (PEG fil gra stim) is a long-acting granulocyte colony-stimulating factor that stimulates the growth of neutrophils, a type of white blood cell important in the body's fight against infection. It is used to reduce the incidence of fever and infection in patients with certain types of cancer who are receiving chemotherapy that affects the bone marrow, and to increase survival after being exposed to high doses of radiation. This medicine may be used for other purposes; ask your health care provider or pharmacist if you have questions. What should I tell my health care provider before I take this medicine? They need to know if you have any of these conditions: -kidney disease -latex allergy -ongoing radiation therapy -sickle cell disease -skin reactions to acrylic adhesives (On-Body Injector only) -an unusual or allergic reaction to pegfilgrastim, filgrastim, other medicines, foods, dyes, or preservatives -pregnant or trying to get pregnant -breast-feeding How should I use this medicine? This medicine is for injection under the skin. If you get this medicine at home, you will be taught how to prepare and give the pre-filled syringe or how to use the On-body Injector. Refer to the patient Instructions for Use for detailed instructions. Use exactly as directed. Take your medicine at regular intervals. Do not take your medicine more often than directed. It is important that you put your used needles and syringes in a special sharps container. Do not put them in a trash can. If you do not have a sharps container, call your pharmacist or healthcare provider to get one. Talk to your pediatrician regarding the use of this medicine in children. While this drug may be prescribed for selected conditions, precautions do apply. Overdosage: If you think you have taken too much of this medicine contact a poison control center or emergency room at  once. NOTE: This medicine is only for you. Do not share this medicine with others. What if I miss a dose? It is important not to miss your dose. Call your doctor or health care professional if you miss your dose. If you miss a dose due to an On-body Injector failure or leakage, a new dose should be administered as soon as possible using a single prefilled syringe for manual use. What may interact with this medicine? Interactions have not been studied. Give your health care provider a list of all the medicines, herbs, non-prescription drugs, or dietary supplements you use. Also tell them if you smoke, drink alcohol, or use illegal drugs. Some items may interact with your medicine. This list may not describe all possible interactions. Give your health care provider a list of all the medicines, herbs, non-prescription drugs, or dietary supplements you use. Also tell them if you smoke, drink alcohol, or use illegal drugs. Some items may interact with your medicine. What should I watch for while using this medicine? You may need blood work done while you are taking this medicine. If you are going to need a MRI, CT scan, or other procedure, tell your doctor that you are using this medicine (On-Body Injector only). What side effects may I notice from receiving this medicine? Side effects that you should report to your doctor or health care professional as soon as possible: -allergic reactions like skin rash, itching or hives, swelling of the face, lips, or tongue -dizziness -fever -pain, redness, or irritation at site where injected -pinpoint red spots on the skin -red or dark-brown urine -shortness of breath or breathing problems -stomach or side pain, or pain   at the shoulder -swelling -tiredness -trouble passing urine or change in the amount of urine Side effects that usually do not require medical attention (report to your doctor or health care professional if they continue or are  bothersome): -bone pain -muscle pain This list may not describe all possible side effects. Call your doctor for medical advice about side effects. You may report side effects to FDA at 1-800-FDA-1088. Where should I keep my medicine? Keep out of the reach of children. Store pre-filled syringes in a refrigerator between 2 and 8 degrees C (36 and 46 degrees F). Do not freeze. Keep in carton to protect from light. Throw away this medicine if it is left out of the refrigerator for more than 48 hours. Throw away any unused medicine after the expiration date. NOTE: This sheet is a summary. It may not cover all possible information. If you have questions about this medicine, talk to your doctor, pharmacist, or health care provider.    2016, Elsevier/Gold Standard. (2014-09-10 14:30:14)  

## 2015-12-20 ENCOUNTER — Other Ambulatory Visit: Payer: Self-pay | Admitting: *Deleted

## 2015-12-20 DIAGNOSIS — C159 Malignant neoplasm of esophagus, unspecified: Secondary | ICD-10-CM

## 2015-12-21 ENCOUNTER — Encounter: Payer: Self-pay | Admitting: Hematology

## 2015-12-21 ENCOUNTER — Other Ambulatory Visit (HOSPITAL_BASED_OUTPATIENT_CLINIC_OR_DEPARTMENT_OTHER): Payer: 59

## 2015-12-21 ENCOUNTER — Ambulatory Visit (HOSPITAL_BASED_OUTPATIENT_CLINIC_OR_DEPARTMENT_OTHER): Payer: 59 | Admitting: Hematology

## 2015-12-21 VITALS — BP 119/67 | HR 59 | Temp 98.0°F | Resp 18 | Ht 69.0 in | Wt 128.8 lb

## 2015-12-21 DIAGNOSIS — C154 Malignant neoplasm of middle third of esophagus: Secondary | ICD-10-CM

## 2015-12-21 DIAGNOSIS — R1310 Dysphagia, unspecified: Secondary | ICD-10-CM

## 2015-12-21 DIAGNOSIS — C155 Malignant neoplasm of lower third of esophagus: Secondary | ICD-10-CM

## 2015-12-21 DIAGNOSIS — C787 Secondary malignant neoplasm of liver and intrahepatic bile duct: Secondary | ICD-10-CM

## 2015-12-21 DIAGNOSIS — D72829 Elevated white blood cell count, unspecified: Secondary | ICD-10-CM | POA: Diagnosis not present

## 2015-12-21 DIAGNOSIS — C159 Malignant neoplasm of esophagus, unspecified: Secondary | ICD-10-CM

## 2015-12-21 LAB — COMPREHENSIVE METABOLIC PANEL
ALT: 22 U/L (ref 0–55)
AST: 20 U/L (ref 5–34)
Albumin: 3.4 g/dL — ABNORMAL LOW (ref 3.5–5.0)
Alkaline Phosphatase: 151 U/L — ABNORMAL HIGH (ref 40–150)
Anion Gap: 8 mEq/L (ref 3–11)
BUN: 11.7 mg/dL (ref 7.0–26.0)
CHLORIDE: 95 meq/L — AB (ref 98–109)
CO2: 33 meq/L — AB (ref 22–29)
CREATININE: 0.7 mg/dL (ref 0.7–1.3)
Calcium: 9.4 mg/dL (ref 8.4–10.4)
EGFR: >90 mL/min/{1.73_m2} (ref >90)
GLUCOSE: 102 mg/dL (ref 70–140)
Potassium: 4.3 mEq/L (ref 3.5–5.1)
SODIUM: 136 meq/L (ref 136–145)
TOTAL PROTEIN: 7 g/dL (ref 6.4–8.3)

## 2015-12-21 LAB — CBC & DIFF AND RETIC
BASO%: 0.1 % (ref 0.0–2.0)
Basophils Absolute: 0 10*3/uL (ref 0.0–0.1)
EOS%: 0 % (ref 0.0–7.0)
Eosinophils Absolute: 0 10*3/uL (ref 0.0–0.5)
HCT: 32.6 % — ABNORMAL LOW (ref 38.4–49.9)
HGB: 10.8 g/dL — ABNORMAL LOW (ref 13.0–17.1)
IMMATURE RETIC FRACT: 16.5 % — AB (ref 3.00–10.60)
LYMPH#: 0.7 10*3/uL — AB (ref 0.9–3.3)
LYMPH%: 1.8 % — ABNORMAL LOW (ref 14.0–49.0)
MCH: 30.4 pg (ref 27.2–33.4)
MCHC: 33.1 g/dL (ref 32.0–36.0)
MCV: 91.8 fL (ref 79.3–98.0)
MONO#: 1.1 10*3/uL — AB (ref 0.1–0.9)
MONO%: 2.8 % (ref 0.0–14.0)
NEUT#: 37.3 10*3/uL — ABNORMAL HIGH (ref 1.5–6.5)
NEUT%: 95.3 % — AB (ref 39.0–75.0)
PLATELETS: 290 10*3/uL (ref 140–400)
RBC: 3.55 10*6/uL — AB (ref 4.20–5.82)
RDW: 17.3 % — AB (ref 11.0–14.6)
RETIC %: 2 % — AB (ref 0.80–1.80)
RETIC CT ABS: 71 10*3/uL (ref 34.80–93.90)
WBC: 39.1 10*3/uL — ABNORMAL HIGH (ref 4.0–10.3)

## 2015-12-21 MED ORDER — SUCRALFATE 1 GM/10ML PO SUSP: 1.0000 g | Freq: Three times a day (TID) | ORAL | Status: DC

## 2015-12-21 NOTE — Progress Notes (Signed)
Samuel [facility]    HEMATOLOGY/ONCOLOGY CLINIC NOTE  Date of Service: . 12/21/2015   Patient Care Team: Leanna Battles, MD as PCP - General (Internal Medicine)  CHIEF COMPLAINTS/PURPOSE OF CONSULTATION:  Newly diagnosed Esophageal Squamous cell carcinoma   HISTORY OF PRESENTING ILLNESS: plz see my initial consultation regarding patients initial presentation  INTERVAL HISTORY  Mr Baltzell is here for follow-up for toxicity check after his first cycle of carboplatin and Taxol chemotherapy. He notes no acute issues. Had some minimal grade 1 fatigue which is resolved. Notes that he is eating well and has gained about 3 pounds since 11/22/2015 and 6 pounds since 11/16/2015. Notes no significant nausea. No other acute toxicities. Notes that his dysphagia has been improving. No new abdominal pain. No fevers or chills. Notes that he is choosing to continue working at this time. Has met with the social worker and has received information if at some point he is unable to continue working.  MEDICAL HISTORY:  Past Medical History  Diagnosis Date  . Hypertension   . Arthritis   . Knee pain   . Esophageal cancer [facility])     SURGICAL HISTORY: Past Surgical History  Procedure Laterality Date  . Knee surgery    . Lesion removal  1969    hip  . Esophagogastroduodenoscopy (egd) with esophageal dilation      and biopsy    SOCIAL HISTORY: Social History   Social History  . Marital Status: Single    Spouse Name: N/A  . Number of Children: 2  . Years of Education: N/A   Occupational History  . Manufacturing industrial curtains    Social History Main Topics  . Smoking status: Current Every Day Smoker -- 0.25 packs/day for 20 years    Types: Cigarettes  . Smokeless tobacco: Never Used  . Alcohol Use: No  . Drug Use: Yes    Special: Marijuana     Comment: last smoked 1 week ago  . Sexual Activity: Yes   Other Topics Concern  . 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  FAMILY HISTORY: Family History  Problem Relation Age of Onset  . Colon cancer Neg Hx   . Esophageal cancer Father   . Cancer Father     nasopharyngeal   . Diabetes Sister   . Cancer Sister     multiple myeloma    ALLERGIES:  has No Known Allergies.  MEDICATIONS:  Current Outpatient Prescriptions  Medication Sig Dispense Refill  . amLODipine (NORVASC) 5 MG tablet Take 5 mg by mouth daily.    .Samuel Kitchendexamethasone (DECADRON) 4 MG tablet Take 2 tablets (8 mg total) by mouth daily. Start the day after chemotherapy for 4 days. 30 tablet 1  . hydrocortisone (CORTEF) 20 MG tablet     . lidocaine-prilocaine (EMLA) cream Apply 1 application topically once. 30 g 1  . lisinopril-hydrochlorothiazide (PRINZIDE,ZESTORETIC) 10-12.5 MG tablet     . LORazepam (ATIVAN) 0.5 MG tablet Take 1 tablet (0.5 mg total) by mouth every 6 (six) hours as needed (Nausea or vomiting). 30 tablet 0  . meloxicam (MOBIC) 15  MG tablet Take 15 mg by mouth daily.    Samuel Henderson omeprazole (PRILOSEC) 20 MG capsule Take 1 capsule (20 mg total) by mouth 2 (two) times daily before a meal. 60 capsule 2  . ondansetron (ZOFRAN) 8 MG tablet Take 1 tablet (8 mg total) by mouth every 8 (eight) hours as needed for nausea. 30 tablet 3  . ondansetron (ZOFRAN) 8 MG tablet Take 1 tablet (8 mg total) by mouth 2 (two) times daily as needed for refractory nausea / vomiting. Start on day 3 after chemo. 30 tablet 1  . oxyCODONE (ROXICODONE) 5 MG/5ML solution Take 5-10 mLs (5-10 mg total) by mouth every 4 (four) hours as needed for moderate pain or severe pain. 200 mL 0  . prochlorperazine (COMPAZINE) 10 MG tablet Take 1 tablet (10 mg total) by mouth every 6  (six) hours as needed (Nausea or vomiting). 30 tablet 1  . sucralfate (CARAFATE) 1 GM/10ML suspension Take 10 mLs (1 g total) by mouth 4 (four) times daily -  with meals and at bedtime. 420 mL 0  . VIAGRA 100 MG tablet Reported on 10/22/2015     No current facility-administered medications for this visit.    REVIEW OF SYSTEMS:    10 Point review of Systems was done is negative except as noted above.  PHYSICAL EXAMINATION: ECOG PERFORMANCE STATUS: 1 - Symptomatic but completely ambulatory  . Filed Vitals:   12/21/15 1033  BP: 119/67  Pulse: 59  Temp: 98 F (36.7 C)  Resp: 18   Filed Weights   12/21/15 1033  Weight: 128 lb 12.8 oz (58.423 kg)   .Body mass index is 19.01 kg/(m^2).  Samuel Henderson Wt Readings from Last 3 Encounters:  12/21/15 128 lb 12.8 oz (58.423 kg)  12/09/15 122 lb (55.339 kg)  12/08/15 125 lb (56.7 kg)    GENERAL:middle aged AAM ,alert, in no acute distress and comfortable SKIN: skin color, texture, turgor are normal, no rashes or significant lesions EYES: normal, conjunctiva are pink and non-injected, sclera clear OROPHARYNX:no exudate, no erythema and lips, buccal mucosa, and tongue normal  NECK: supple, no JVD, thyroid normal size, non-tender, without nodularity LYMPH:  no palpable lymphadenopathy in the cervical, axillary or inguinal LUNGS: clear to auscultation with normal respiratory effort HEART: regular rate & rhythm,  no murmurs and no lower extremity edema ABDOMEN: abdomen soft, non-tender, normoactive bowel sounds  Musculoskeletal: no cyanosis of digits and no clubbing  PSYCH: alert & oriented x 3 with fluent speech NEURO: no focal motor/sensory deficits  LABORATORY DATA:  I have reviewed the data as listed  . CBC Latest Ref Rng 12/21/2015 12/08/2015 12/07/2015  WBC 4.0 - 10.3 10e3/uL 39.1(H) 5.4 4.8  Hemoglobin 13.0 - 17.1 g/dL 10.8(L) 11.5(L) 12.1(L)  Hematocrit 38.4 - 49.9 % 32.6(L) 33.8(L) 36.8(L)  Platelets 140 - 400 10e3/uL 290 428(H) 362    . CBC    Component Value Date/Time   WBC 39.1* 12/21/2015 1006   WBC 5.4 12/08/2015 1149   RBC 3.55* 12/21/2015 1006   RBC 3.84* 12/08/2015 1149   HGB 10.8* 12/21/2015 1006   HGB 11.5* 12/08/2015 1149   HCT 32.6* 12/21/2015 1006   HCT 33.8* 12/08/2015 1149   PLT 290 12/21/2015 1006   PLT 428* 12/08/2015 1149   MCV 91.8 12/21/2015 1006   MCV 88.0 12/08/2015 1149   MCH 30.4 12/21/2015 1006   MCH 29.9 12/08/2015 1149   MCHC 33.1 12/21/2015 1006   MCHC 34.0 12/08/2015 1149   RDW 17.3* 12/21/2015 1006  RDW 15.7* 12/08/2015 1149   LYMPHSABS 0.7* 12/21/2015 1006   LYMPHSABS 1.3 12/08/2015 1149   MONOABS 1.1* 12/21/2015 1006   MONOABS 0.5 12/08/2015 1149   EOSABS 0.0 12/21/2015 1006   EOSABS 0.1 12/08/2015 1149   BASOSABS 0.0 12/21/2015 1006   BASOSABS 0.0 12/08/2015 1149    CMP Latest Ref Rng 12/21/2015 12/07/2015 11/30/2015  Glucose 70 - 140 mg/dl 102 96 100  BUN 7.0 - 26.0 mg/dL 11.7 6.5(L) 7.4  Creatinine 0.7 - 1.3 mg/dL 0.7 0.8 0.7  Sodium 136 - 145 mEq/L 136 142 140  Potassium 3.5 - 5.1 mEq/L 4.3 3.7 3.5  CO2 22 - 29 mEq/L 33(H) 32(H) 31(H)  Calcium 8.4 - 10.4 mg/dL 9.4 10.0 9.6  Total Protein 6.4 - 8.3 g/dL 7.0 8.3 7.9  Total Bilirubin 0.20 - 1.20 mg/dL <0.30 0.36 0.30  Alkaline Phos 40 - 150 U/L 151(H) 87 78  AST 5 - 34 U/L 20 33 29  ALT 0 - 55 U/L _0 RADIOGRAPHIC STUDIES: I have personally reviewed the radiological images as listed and agreed with the findings in the report. Ir Fluoro Guide Cv Line Right  12/08/2015  INDICATION: 55 year old male with a history of esophageal carcinoma EXAM: IMPLANTED PORT A CATH PLACEMENT WITH ULTRASOUND AND FLUOROSCOPIC GUIDANCE MEDICATIONS: 2.0 g Ancef; The antibiotic was administered within an appropriate time interval prior to skin puncture. ANESTHESIA/SEDATION: Moderate (conscious) sedation was employed during this procedure. A total of Versed 2.0 mg and Fentanyl 75 mcg was administered intravenously. Moderate Sedation  Time: 24 minutes. The patient's level of consciousness and vital signs were monitored continuously by radiology nursing throughout the procedure under my direct supervision. FLUOROSCOPY TIME:  Zero minutes, 6 seconds (1 mGy) COMPLICATIONS: None PROCEDURE: The procedure, risks, benefits, and alternatives were explained to the patient. Questions regarding the procedure were encouraged and answered. The patient understands and consents to the procedure. Ultrasound survey was performed with images stored and sent to PACs. The right neck and chest was prepped with chlorhexidine, and draped in the usual sterile fashion using maximum barrier technique (cap and mask, sterile gown, sterile gloves, large sterile sheet, hand hygiene and cutaneous antiseptic). Antibiotic prophylaxis was provided with 2.0g Ancef administered IV one hour prior to skin incision. Local anesthesia was attained by infiltration with 1% lidocaine without epinephrine. Ultrasound demonstrated patency of the right internal jugular vein, and this was documented with an image. Under real-time ultrasound guidance, this vein was accessed with a 21 gauge micropuncture needle and image documentation was performed. A small dermatotomy was made at the access site with an 11 scalpel. A 0.018" wire was advanced into the SVC and used to estimate the length of the internal catheter. The access needle exchanged for a 79F micropuncture vascular sheath. The 0.018" wire was then removed and a 0.035" wire advanced into the IVC. An appropriate location for the subcutaneous reservoir was selected below the clavicle and an incision was made through the skin and underlying soft tissues. The subcutaneous tissues were then dissected using a combination of blunt and sharp surgical technique and a pocket was formed. A single lumen power injectable portacatheter was then tunneled through the subcutaneous tissues from the pocket to the dermatotomy and the port reservoir placed  within the subcutaneous pocket. The venous access site was then serially dilated and a peel away vascular sheath placed over the wire. The wire was removed and the port catheter advanced into position under fluoroscopic guidance. The catheter tip is  positioned in the cavoatrial junction. This was documented with a spot image. The portacatheter was then tested and found to flush and aspirate well. The port was flushed with saline followed by 100 units/mL heparinized saline. The pocket was then closed in two layers using first subdermal inverted interrupted absorbable sutures followed by a running subcuticular suture. The epidermis was then sealed with Dermabond. The dermatotomy at the venous access site was also seal with Dermabond. Patient tolerated the procedure well and remained hemodynamically stable throughout. No complications encountered and no significant blood loss encountered IMPRESSION: Status post right IJ port catheter placement. Catheter ready for use. Signed, Dulcy Fanny. Earleen Newport, DO [facility] Radiology Electronically Signed   By: Corrie Mckusick D.O.   On: 12/08/2015 15:58   Ir US Guide Vasc Access Left  12/08/2015  INDICATION: 55 year old male with a history of esophageal carcinoma EXAM: IMPLANTED PORT A CATH PLACEMENT WITH ULTRASOUND AND FLUOROSCOPIC GUIDANCE MEDICATIONS: 2.0 g Ancef; The antibiotic was administered within an appropriate time interval prior to skin puncture. ANESTHESIA/SEDATION: Moderate (conscious) sedation was employed during this procedure. A total of Versed 2.0 mg and Fentanyl 75 mcg was administered intravenously. Moderate Sedation Time: 24 minutes. The patient's level of consciousness and vital signs were monitored continuously by radiology nursing throughout the procedure under my direct supervision. FLUOROSCOPY TIME:  Zero minutes, 6 seconds (1 mGy) COMPLICATIONS: None PROCEDURE: The procedure, risks, benefits, and alternatives  were explained to the patient. Questions regarding the procedure were encouraged and answered. The patient understands and consents to the procedure. Ultrasound survey was performed with images stored and sent to PACs. The right neck and chest was prepped with chlorhexidine, and draped in the usual sterile fashion using maximum barrier technique (cap and mask, sterile gown, sterile gloves, large sterile sheet, hand hygiene and cutaneous antiseptic). Antibiotic prophylaxis was provided with 2.0g Ancef administered IV one hour prior to skin incision. Local anesthesia was attained by infiltration with 1% lidocaine without epinephrine. Ultrasound demonstrated patency of the right internal jugular vein, and this was documented with an image. Under real-time ultrasound guidance, this vein was accessed with a 21 gauge micropuncture needle and image documentation was performed. A small dermatotomy was made at the access site with an 11 scalpel. A 0.018" wire was advanced into the SVC and used to estimate the length of the internal catheter. The access needle exchanged for a 6F micropuncture vascular sheath. The 0.018" wire was then removed and a 0.035" wire advanced into the IVC. An appropriate location for the subcutaneous reservoir was selected below the clavicle and an incision was made through the skin and underlying soft tissues. The subcutaneous tissues were then dissected using a combination of blunt and sharp surgical technique and a pocket was formed. A single lumen power injectable portacatheter was then tunneled through the subcutaneous tissues from the pocket to the dermatotomy and the port reservoir placed within the subcutaneous pocket. The venous access site was then serially dilated and a peel away vascular sheath placed over the wire. The wire was removed and the port catheter advanced into position under fluoroscopic guidance. The catheter tip is positioned in the cavoatrial junction. This was documented  with a spot image. The portacatheter was then tested and found to flush and aspirate well. The port was flushed with saline followed by 100 units/mL heparinized saline. The pocket was then closed in two layers using first subdermal inverted interrupted absorbable sutures followed by a running subcuticular suture. The epidermis was  then sealed with Dermabond. The dermatotomy at the venous access site was also seal with Dermabond. Patient tolerated the procedure well and remained hemodynamically stable throughout. No complications encountered and no significant blood loss encountered IMPRESSION: Status post right IJ port catheter placement. Catheter ready for use. Signed, Dulcy Fanny. Earleen Newport, DO [facility] Radiology Electronically Signed   By: Corrie Mckusick D.O.   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 S/p palliative radiation therapy to the esophagus and regional lymph nodes. He has tolerated treatment generally well except for some mild grade 2 radiation esophagitis which has now resolved. Repeat CT chest abdomen pelvis done 11/17/2015 show progression of his liver metastases and gastrohepatic lymph node as expected.  He has completed his first cycle of carboplatin and Taxol and is here for toxicity check today. He notes no prohibitive toxicities at this time. He has no symptoms suggestive of disease progression at this time.  2) HTN- patient notes his appetite has improved and he is eating better. He is back on his amlodipine. 3) Dysphagia due to esophageal SCC -continues to improve. 4) grade 2 radiation esophagitis. Resolved . 6) fatigue due to -Resolved 7 leukocytosis likely due to Neulasta shot. No evidence of infection at this  time.  Plan -Patient tolerated first cycle of chemotherapy well with no acute toxicities. Minimal grade 1 fatigue. -. requested refill for sucralfate which he finds is useful to avoid swallowing related discomfort from his lower esophageal mass. -We'll plan to continue carboplatin plus Taxol at current doses and with current medications and G-CSF support. -I will see him again prior to his third cycle of carboplatin and Taxol and will plan to repeat CT chest abdomen pelvis prior to his fourth cycle unless any new symptoms arise. -Referral given for his nausea medications. -He has been eating well and has gained weight. Provided some free samples for ensure to help with his nutrition. -All of his questions and concerns were answered in detail.  I spent 25 minutes counseling the patient face to face. The total time spent in the appointment was 25 minutes and more than 50% was on counseling and direct patient cares.    Sullivan Lone MD [place] AAHIVMS [facility] Hematology/Oncology Physician [facility]  (Office):       [phone] (Work cell):  [phone] (Fax):           [phone]

## 2015-12-27 ENCOUNTER — Other Ambulatory Visit: Payer: Self-pay | Admitting: *Deleted

## 2015-12-27 DIAGNOSIS — C155 Malignant neoplasm of lower third of esophagus: Secondary | ICD-10-CM

## 2015-12-28 ENCOUNTER — Other Ambulatory Visit (HOSPITAL_BASED_OUTPATIENT_CLINIC_OR_DEPARTMENT_OTHER): Payer: 59

## 2015-12-28 ENCOUNTER — Ambulatory Visit (HOSPITAL_BASED_OUTPATIENT_CLINIC_OR_DEPARTMENT_OTHER): Payer: 59

## 2015-12-28 ENCOUNTER — Ambulatory Visit: Payer: 59 | Admitting: Nutrition

## 2015-12-28 VITALS — BP 124/83 | HR 77 | Temp 97.8°F | Resp 18

## 2015-12-28 DIAGNOSIS — C787 Secondary malignant neoplasm of liver and intrahepatic bile duct: Secondary | ICD-10-CM | POA: Diagnosis not present

## 2015-12-28 DIAGNOSIS — C159 Malignant neoplasm of esophagus, unspecified: Secondary | ICD-10-CM

## 2015-12-28 DIAGNOSIS — C155 Malignant neoplasm of lower third of esophagus: Secondary | ICD-10-CM | POA: Diagnosis not present

## 2015-12-28 DIAGNOSIS — Z5111 Encounter for antineoplastic chemotherapy: Secondary | ICD-10-CM

## 2015-12-28 LAB — CBC WITH DIFFERENTIAL/PLATELET
BASO%: 0.1 % (ref 0.0–2.0)
BASOS ABS: 0 10*3/uL (ref 0.0–0.1)
EOS%: 0.4 % (ref 0.0–7.0)
Eosinophils Absolute: 0 10*3/uL (ref 0.0–0.5)
HCT: 35.3 % — ABNORMAL LOW (ref 38.4–49.9)
HGB: 11.7 g/dL — ABNORMAL LOW (ref 13.0–17.1)
LYMPH%: 7.8 % — AB (ref 14.0–49.0)
MCH: 30.5 pg (ref 27.2–33.4)
MCHC: 33.1 g/dL (ref 32.0–36.0)
MCV: 92.2 fL (ref 79.3–98.0)
MONO#: 1.1 10*3/uL — ABNORMAL HIGH (ref 0.1–0.9)
MONO%: 13.1 % (ref 0.0–14.0)
NEUT#: 6.5 10*3/uL (ref 1.5–6.5)
NEUT%: 78.6 % — AB (ref 39.0–75.0)
PLATELETS: 275 10*3/uL (ref 140–400)
RBC: 3.83 10*6/uL — AB (ref 4.20–5.82)
RDW: 18.1 % — ABNORMAL HIGH (ref 11.0–14.6)
WBC: 8.2 10*3/uL (ref 4.0–10.3)
lymph#: 0.6 10*3/uL — ABNORMAL LOW (ref 0.9–3.3)

## 2015-12-28 LAB — COMPREHENSIVE METABOLIC PANEL
ALT: 24 U/L (ref 0–55)
AST: 23 U/L (ref 5–34)
Albumin: 3.3 g/dL — ABNORMAL LOW (ref 3.5–5.0)
Alkaline Phosphatase: 105 U/L (ref 40–150)
Anion Gap: 9 mEq/L (ref 3–11)
BUN: 9 mg/dL (ref 7.0–26.0)
CHLORIDE: 93 meq/L — AB (ref 98–109)
CO2: 29 meq/L (ref 22–29)
CREATININE: 0.7 mg/dL (ref 0.7–1.3)
Calcium: 9.4 mg/dL (ref 8.4–10.4)
EGFR: >90 mL/min/{1.73_m2} (ref >90)
Glucose: 86 mg/dl (ref 70–140)
Potassium: 4.1 mEq/L (ref 3.5–5.1)
Sodium: 132 mEq/L — ABNORMAL LOW (ref 136–145)
Total Bilirubin: 0.68 mg/dL (ref 0.20–1.20)
Total Protein: 7.2 g/dL (ref 6.4–8.3)

## 2015-12-28 MED ORDER — DIPHENHYDRAMINE HCL 50 MG/ML IJ SOLN
50.0000 mg | Freq: Once | INTRAMUSCULAR | Status: AC
  Administered 2015-12-28: 50 mg via INTRAVENOUS

## 2015-12-28 MED ORDER — DIPHENHYDRAMINE HCL 50 MG/ML IJ SOLN
INTRAMUSCULAR | Status: AC
  Filled 2015-12-28: qty 1

## 2015-12-28 MED ORDER — SODIUM CHLORIDE 0.9 % IV SOLN
INTRAVENOUS | Status: AC
  Administered 2015-12-28: 11:00:00 via INTRAVENOUS

## 2015-12-28 MED ORDER — PALONOSETRON HCL INJECTION 0.25 MG/5ML
0.2500 mg | Freq: Once | INTRAVENOUS | Status: AC
  Administered 2015-12-28: 0.25 mg via INTRAVENOUS

## 2015-12-28 MED ORDER — FAMOTIDINE IN NACL 20-0.9 MG/50ML-% IV SOLN
INTRAVENOUS | Status: AC
  Filled 2015-12-28: qty 50

## 2015-12-28 MED ORDER — PALONOSETRON HCL INJECTION 0.25 MG/5ML
INTRAVENOUS | Status: AC
  Filled 2015-12-28: qty 5

## 2015-12-28 MED ORDER — HEPARIN SOD (PORK) LOCK FLUSH 100 UNIT/ML IV SOLN
500.0000 [IU] | Freq: Once | INTRAVENOUS | Status: AC | PRN
  Administered 2015-12-28: 500 [IU]
  Filled 2015-12-28: qty 5

## 2015-12-28 MED ORDER — SODIUM CHLORIDE 0.9 % IV SOLN
20.0000 mg | Freq: Once | INTRAVENOUS | Status: AC
  Administered 2015-12-28: 20 mg via INTRAVENOUS
  Filled 2015-12-28: qty 2

## 2015-12-28 MED ORDER — SODIUM CHLORIDE 0.9 % IV SOLN
Freq: Once | INTRAVENOUS | Status: AC
  Administered 2015-12-28: 11:00:00 via INTRAVENOUS

## 2015-12-28 MED ORDER — SODIUM CHLORIDE 0.9 % IV SOLN
540.0000 mg | Freq: Once | INTRAVENOUS | Status: AC
  Administered 2015-12-28: 540 mg via INTRAVENOUS
  Filled 2015-12-28: qty 54

## 2015-12-28 MED ORDER — SODIUM CHLORIDE 0.9% FLUSH
10.0000 mL | INTRAVENOUS | Status: DC | PRN
  Administered 2015-12-28: 10 mL
  Filled 2015-12-28: qty 10

## 2015-12-28 MED ORDER — SODIUM CHLORIDE 0.9 % IV SOLN
175.0000 mg/m2 | Freq: Once | INTRAVENOUS | Status: AC
  Administered 2015-12-28: 294 mg via INTRAVENOUS
  Filled 2015-12-28: qty 49

## 2015-12-28 MED ORDER — FAMOTIDINE IN NACL 20-0.9 MG/50ML-% IV SOLN
20.0000 mg | Freq: Once | INTRAVENOUS | Status: AC
  Administered 2015-12-28: 20 mg via INTRAVENOUS

## 2015-12-28 NOTE — Progress Notes (Signed)
Nutrition follow-up completed with patient during infusion for esophageal cancer. Weight improved documented as 128 pounds increased from 122 pounds April 6. Patient reports he is able to swallow better now that treatment has progressed He continues to consume oral nutrition supplements. Patient denies nutrition impact symptoms.  Nutrition diagnosis: Unintended weight loss improved.  Intervention:  Patient educated to continue meals and snacks consuming high-calorie high-protein foods as tolerated. Patient educated to continue oral nutrition supplements as needed throughout the day. Teach back method used.  Questions were answered.  Monitoring, evaluation, goals: Patient will continue to increase calories and protein to promote repletion.  Next visit: Tuesday, May 16, during infusion.  **Disclaimer: This note was dictated with voice recognition software. Similar sounding words can inadvertently be transcribed and this note may contain transcription errors which may not have been corrected upon publication of note.**

## 2015-12-28 NOTE — Patient Instructions (Signed)
[facility] Discharge Instructions for Patients Receiving Chemotherapy  Today you received the following chemotherapy agents Taxol/Carboplatin To help prevent nausea and vomiting after your treatment, we encourage you to take your nausea medication as prescribed.   If you develop nausea and vomiting that is not controlled by your nausea medication, call the clinic.   BELOW ARE SYMPTOMS THAT SHOULD BE REPORTED IMMEDIATELY:  *FEVER GREATER THAN 100.5 F  *CHILLS WITH OR WITHOUT FEVER  NAUSEA AND VOMITING THAT IS NOT CONTROLLED WITH YOUR NAUSEA MEDICATION  *UNUSUAL SHORTNESS OF BREATH  *UNUSUAL BRUISING OR BLEEDING  TENDERNESS IN MOUTH AND THROAT WITH OR WITHOUT PRESENCE OF ULCERS  *URINARY PROBLEMS  *BOWEL PROBLEMS  UNUSUAL RASH Items with * indicate a potential emergency and should be followed up as soon as possible.  Feel free to call the clinic you have any questions or concerns. The clinic phone number is [phone].  Please show the CHEMO ALERT CARD at check-in to the Emergency Department and triage nurse.   

## 2015-12-29 ENCOUNTER — Ambulatory Visit: Payer: 59

## 2015-12-30 ENCOUNTER — Ambulatory Visit (HOSPITAL_BASED_OUTPATIENT_CLINIC_OR_DEPARTMENT_OTHER): Payer: 59

## 2015-12-30 VITALS — BP 112/67 | HR 84 | Temp 98.4°F

## 2015-12-30 DIAGNOSIS — C787 Secondary malignant neoplasm of liver and intrahepatic bile duct: Secondary | ICD-10-CM | POA: Diagnosis not present

## 2015-12-30 DIAGNOSIS — C159 Malignant neoplasm of esophagus, unspecified: Secondary | ICD-10-CM

## 2015-12-30 MED ORDER — PEGFILGRASTIM INJECTION 6 MG/0.6ML ~~LOC~~
6.0000 mg | PREFILLED_SYRINGE | Freq: Once | SUBCUTANEOUS | Status: AC
  Administered 2015-12-30: 6 mg via SUBCUTANEOUS
  Filled 2015-12-30: qty 0.6

## 2016-01-02 ENCOUNTER — Other Ambulatory Visit: Payer: Self-pay | Admitting: Hematology

## 2016-01-03 ENCOUNTER — Other Ambulatory Visit: Payer: Self-pay | Admitting: *Deleted

## 2016-01-03 DIAGNOSIS — C787 Secondary malignant neoplasm of liver and intrahepatic bile duct: Secondary | ICD-10-CM

## 2016-01-03 DIAGNOSIS — C159 Malignant neoplasm of esophagus, unspecified: Secondary | ICD-10-CM

## 2016-01-03 MED ORDER — ONDANSETRON HCL 8 MG PO TABS: 8.0000 mg | ORAL_TABLET | Freq: Three times a day (TID) | ORAL | Status: DC | PRN

## 2016-01-03 MED ORDER — PROCHLORPERAZINE MALEATE 10 MG PO TABS: 10.0000 mg | ORAL_TABLET | Freq: Four times a day (QID) | ORAL | Status: DC | PRN

## 2016-01-04 ENCOUNTER — Other Ambulatory Visit: Payer: Self-pay | Admitting: Hematology

## 2016-01-06 ENCOUNTER — Encounter: Payer: Self-pay | Admitting: Hematology

## 2016-01-06 NOTE — Progress Notes (Signed)
Pt's mother called following up on a conversation we had in person for a Estate manager/land agent introduction. Patient gave me permission to speak with her. They were concerned that patient is only able to work enough to pay for his medical insurance and is having financial difficulties and would like to make an appointment with me. I reviewed my notes and saw where I mentioned the [place] and other resources if he became interested. Asked patient if they would like for me to meet with them on 5/16 when he comes for treatment. He states that would be fine. Advised to bring most recent pay stub on this day. Patient verbalized understanding and has my name and number for any other financial questions or concerns. Will also reach out to SW for other possible assistance.

## 2016-01-17 ENCOUNTER — Other Ambulatory Visit: Payer: Self-pay | Admitting: *Deleted

## 2016-01-17 DIAGNOSIS — C155 Malignant neoplasm of lower third of esophagus: Secondary | ICD-10-CM

## 2016-01-18 ENCOUNTER — Other Ambulatory Visit (HOSPITAL_BASED_OUTPATIENT_CLINIC_OR_DEPARTMENT_OTHER): Payer: 59

## 2016-01-18 ENCOUNTER — Ambulatory Visit: Payer: 59

## 2016-01-18 ENCOUNTER — Other Ambulatory Visit: Payer: Self-pay | Admitting: *Deleted

## 2016-01-18 ENCOUNTER — Ambulatory Visit: Payer: 59 | Admitting: Nutrition

## 2016-01-18 ENCOUNTER — Telehealth: Payer: Self-pay | Admitting: Hematology

## 2016-01-18 ENCOUNTER — Ambulatory Visit (HOSPITAL_BASED_OUTPATIENT_CLINIC_OR_DEPARTMENT_OTHER): Payer: 59

## 2016-01-18 ENCOUNTER — Ambulatory Visit (HOSPITAL_BASED_OUTPATIENT_CLINIC_OR_DEPARTMENT_OTHER): Payer: 59 | Admitting: Hematology

## 2016-01-18 VITALS — BP 118/72 | HR 83 | Temp 98.2°F | Resp 16

## 2016-01-18 VITALS — BP 121/74 | HR 76 | Temp 98.1°F | Resp 16 | Ht 69.0 in | Wt 126.0 lb

## 2016-01-18 DIAGNOSIS — C154 Malignant neoplasm of middle third of esophagus: Secondary | ICD-10-CM

## 2016-01-18 DIAGNOSIS — C155 Malignant neoplasm of lower third of esophagus: Secondary | ICD-10-CM

## 2016-01-18 DIAGNOSIS — C787 Secondary malignant neoplasm of liver and intrahepatic bile duct: Secondary | ICD-10-CM

## 2016-01-18 DIAGNOSIS — I10 Essential (primary) hypertension: Secondary | ICD-10-CM

## 2016-01-18 DIAGNOSIS — C159 Malignant neoplasm of esophagus, unspecified: Secondary | ICD-10-CM

## 2016-01-18 DIAGNOSIS — Z5111 Encounter for antineoplastic chemotherapy: Secondary | ICD-10-CM

## 2016-01-18 LAB — CBC WITH DIFFERENTIAL/PLATELET
BASO%: 0.5 % (ref 0.0–2.0)
Basophils Absolute: 0.1 10*3/uL (ref 0.0–0.1)
EOS ABS: 0.1 10*3/uL (ref 0.0–0.5)
EOS%: 0.5 % (ref 0.0–7.0)
HCT: 33 % — ABNORMAL LOW (ref 38.4–49.9)
HGB: 10.8 g/dL — ABNORMAL LOW (ref 13.0–17.1)
LYMPH#: 2.5 10*3/uL (ref 0.9–3.3)
LYMPH%: 24.8 % (ref 14.0–49.0)
MCH: 30.8 pg (ref 27.2–33.4)
MCHC: 32.7 g/dL (ref 32.0–36.0)
MCV: 94 fL (ref 79.3–98.0)
MONO#: 1 10*3/uL — AB (ref 0.1–0.9)
MONO%: 9.7 % (ref 0.0–14.0)
NEUT#: 6.5 10*3/uL (ref 1.5–6.5)
NEUT%: 64.5 % (ref 39.0–75.0)
Platelets: 368 10*3/uL (ref 140–400)
RBC: 3.51 10*6/uL — ABNORMAL LOW (ref 4.20–5.82)
RDW: 19.5 % — ABNORMAL HIGH (ref 11.0–14.6)
WBC: 10 10*3/uL (ref 4.0–10.3)

## 2016-01-18 LAB — COMPREHENSIVE METABOLIC PANEL
ALT: 16 U/L (ref 0–55)
AST: 22 U/L (ref 5–34)
Albumin: 3.6 g/dL (ref 3.5–5.0)
Alkaline Phosphatase: 107 U/L (ref 40–150)
Anion Gap: 9 mEq/L (ref 3–11)
BUN: 4.6 mg/dL — AB (ref 7.0–26.0)
CHLORIDE: 99 meq/L (ref 98–109)
CO2: 30 mEq/L — ABNORMAL HIGH (ref 22–29)
Calcium: 9.9 mg/dL (ref 8.4–10.4)
Creatinine: 0.7 mg/dL (ref 0.7–1.3)
EGFR: >90 mL/min/{1.73_m2} (ref >90)
GLUCOSE: 111 mg/dL (ref 70–140)
POTASSIUM: 3.6 meq/L (ref 3.5–5.1)
SODIUM: 139 meq/L (ref 136–145)
Total Bilirubin: 0.3 mg/dL (ref 0.20–1.20)
Total Protein: 7.6 g/dL (ref 6.4–8.3)

## 2016-01-18 MED ORDER — SODIUM CHLORIDE 0.9 % IV SOLN
175.0000 mg/m2 | Freq: Once | INTRAVENOUS | Status: AC
  Administered 2016-01-18: 294 mg via INTRAVENOUS
  Filled 2016-01-18: qty 49

## 2016-01-18 MED ORDER — SODIUM CHLORIDE 0.9% FLUSH
10.0000 mL | INTRAVENOUS | Status: DC | PRN
  Administered 2016-01-18: 10 mL
  Filled 2016-01-18: qty 10

## 2016-01-18 MED ORDER — FAMOTIDINE IN NACL 20-0.9 MG/50ML-% IV SOLN
20.0000 mg | Freq: Once | INTRAVENOUS | Status: AC
  Administered 2016-01-18: 20 mg via INTRAVENOUS

## 2016-01-18 MED ORDER — DIPHENHYDRAMINE HCL 50 MG/ML IJ SOLN
INTRAMUSCULAR | Status: AC
  Filled 2016-01-18: qty 1

## 2016-01-18 MED ORDER — DIPHENHYDRAMINE HCL 50 MG/ML IJ SOLN
50.0000 mg | Freq: Once | INTRAMUSCULAR | Status: AC
  Administered 2016-01-18: 50 mg via INTRAVENOUS

## 2016-01-18 MED ORDER — PALONOSETRON HCL INJECTION 0.25 MG/5ML
0.2500 mg | Freq: Once | INTRAVENOUS | Status: AC
  Administered 2016-01-18: 0.25 mg via INTRAVENOUS

## 2016-01-18 MED ORDER — FAMOTIDINE IN NACL 20-0.9 MG/50ML-% IV SOLN
INTRAVENOUS | Status: AC
  Filled 2016-01-18: qty 50

## 2016-01-18 MED ORDER — SODIUM CHLORIDE 0.9 % IV SOLN
Freq: Once | INTRAVENOUS | Status: AC
  Administered 2016-01-18: 11:00:00 via INTRAVENOUS

## 2016-01-18 MED ORDER — SODIUM CHLORIDE 0.9 % IV SOLN
INTRAVENOUS | Status: AC
  Administered 2016-01-18: 12:00:00 via INTRAVENOUS

## 2016-01-18 MED ORDER — CARBOPLATIN CHEMO INJECTION 600 MG/60ML
540.0000 mg | Freq: Once | INTRAVENOUS | Status: AC
  Administered 2016-01-18: 540 mg via INTRAVENOUS
  Filled 2016-01-18: qty 54

## 2016-01-18 MED ORDER — LORAZEPAM 0.5 MG PO TABS: 0.5000 mg | ORAL_TABLET | Freq: Four times a day (QID) | ORAL | Status: DC | PRN

## 2016-01-18 MED ORDER — SODIUM CHLORIDE 0.9 % IJ SOLN
10.0000 mL | Freq: Once | INTRAMUSCULAR | Status: AC
  Administered 2016-01-18: 10 mL
  Filled 2016-01-18: qty 10

## 2016-01-18 MED ORDER — PALONOSETRON HCL INJECTION 0.25 MG/5ML
INTRAVENOUS | Status: AC
  Filled 2016-01-18: qty 5

## 2016-01-18 MED ORDER — SODIUM CHLORIDE 0.9 % IV SOLN
20.0000 mg | Freq: Once | INTRAVENOUS | Status: AC
  Administered 2016-01-18: 20 mg via INTRAVENOUS
  Filled 2016-01-18: qty 2

## 2016-01-18 MED ORDER — HEPARIN SOD (PORK) LOCK FLUSH 100 UNIT/ML IV SOLN
500.0000 [IU] | Freq: Once | INTRAVENOUS | Status: AC | PRN
  Administered 2016-01-18: 500 [IU]
  Filled 2016-01-18: qty 5

## 2016-01-18 NOTE — Progress Notes (Signed)
Samuel Henderson    HEMATOLOGY/ONCOLOGY CLINIC NOTE  Date of Service: 01/18/2016  Patient Care Team: Leanna Battles, MD as PCP - General (Internal Medicine)  CHIEF COMPLAINTS/PURPOSE OF CONSULTATION:  Newly diagnosed Esophageal Squamous cell carcinoma   HISTORY OF PRESENTING ILLNESS: plz see my initial consultation regarding patients initial presentation  INTERVAL HISTORY  Samuel Henderson is here for follow-up  Prior to cycle 3 of his planned carboplatin Taxol palliative chemotherapy for metastatic esophageal carcinoma. He notes no nausea no vomiting. His dysphagia continues to improve. No abdominal pain. Notes some grade 1 fatigue for a few days. Has been working about 5  Hours a day. In good spirits. Eating well. No other acute new focal symptoms.  Mother present for his clinic visit.   MEDICAL HISTORY:  Past Medical History  Diagnosis Date  . Hypertension   . Arthritis   . Knee pain   . Esophageal cancer [facility])     SURGICAL HISTORY: Past Surgical History  Procedure Laterality Date  . Knee surgery    . Lesion removal  1969    hip  . Esophagogastroduodenoscopy (egd) with esophageal dilation      and biopsy    SOCIAL HISTORY: Social History   Social History  . Marital Status: Single    Spouse Name: N/A  . Number of Children: 2  . Years of Education: N/A   Occupational History  . Manufacturing industrial curtains    Social History Main Topics  . Smoking status: Current Every Day Smoker -- 0.25 packs/day for 20 years    Types: Cigarettes  . Smokeless tobacco: Never Used  . Alcohol Use: No  . Drug Use: Yes    Special: Marijuana     Comment: last smoked 1 week ago  . Sexual Activity: Yes   Other Topics Concern  . 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 Sober for 15 yrs  Ex smoker 1/2 PPD x 9yr quit about 18 yrs ago.  FAMILY HISTORY: Family History  Problem Relation Age of Onset  . Colon cancer Neg Hx   . Esophageal cancer Father   . Cancer Father     nasopharyngeal   . Diabetes Sister   . Cancer Sister     multiple myeloma    ALLERGIES:  has No Known Allergies.  MEDICATIONS:  Current Outpatient Prescriptions  Medication Sig Dispense Refill  . amLODipine (NORVASC) 5 MG tablet Take 5 mg by mouth daily.    .Samuel Henderson (DECADRON) 4 MG tablet Take 2 tablets (8 mg total) by mouth daily. Start the day after chemotherapy for 4 days. 30 tablet 1  . hydrocortisone (CORTEF) 20 MG tablet     . lidocaine-prilocaine (EMLA) cream Apply 1 application topically once. 30 g 1  . lisinopril-hydrochlorothiazide (PRINZIDE,ZESTORETIC) 10-12.5 MG tablet     . LORazepam (ATIVAN) 0.5 MG tablet Take 1 tablet (0.5 mg total) by mouth every 6 (six) hours as needed (Nausea or vomiting). 30 tablet 0  . meloxicam (MOBIC) 15 MG tablet Take 15 mg by mouth daily.    .Samuel Henderson (PRILOSEC) 20 MG capsule Take 1 capsule (20 mg total) by mouth 2 (two) times daily before a meal. 60  capsule 2  . ondansetron (ZOFRAN) 8 MG tablet Take 1 tablet (8 mg total) by mouth every 8 (eight) hours as needed for nausea. 30 tablet 3  . oxyCODONE (ROXICODONE) 5 MG/5ML solution Take 5-10 mLs (5-10 mg total) by mouth every 4 (four) hours as needed for moderate pain or severe pain. 200 mL 0  . prochlorperazine (COMPAZINE) 10 MG tablet Take 1 tablet (10 mg total) by mouth every 6 (six) hours as needed (Nausea or vomiting). 30 tablet 1  . sucralfate (CARAFATE) 1 GM/10ML suspension Take 10 mLs (1 g total) by mouth 4 (four) times daily -  with meals and at bedtime. 420 mL 0  . VIAGRA 100 MG tablet Reported on 10/22/2015     No current facility-administered medications for this visit.    REVIEW OF SYSTEMS:    10 Point review of Systems was done is  negative except as noted above.  PHYSICAL EXAMINATION: ECOG PERFORMANCE STATUS: 1 - Symptomatic but completely ambulatory  . Filed Vitals:   01/18/16 0951  BP: 121/74  Pulse: 76  Temp: 98.1 F (36.7 C)  Resp: 16   Filed Weights   01/18/16 0951  Weight: 126 lb (57.153 kg)   .Body mass index is 18.6 kg/(m^2).  Samuel Henderson Readings from Last 3 Encounters:  01/18/16 126 lb (57.153 kg)  12/21/15 128 lb 12.8 oz (58.423 kg)  12/09/15 122 lb (55.339 kg)   GENERAL:middle aged AAM ,alert, in no acute distress and comfortable SKIN: skin color, texture, turgor are normal, no rashes or significant lesions EYES: normal, conjunctiva are pink and non-injected, sclera clear OROPHARYNX:no exudate, no erythema and lips, buccal mucosa, and tongue normal  NECK: supple, no JVD, thyroid normal size, non-tender, without nodularity LYMPH:  no palpable lymphadenopathy in the cervical, axillary or inguinal LUNGS: clear to auscultation with normal respiratory effort HEART: regular rate & rhythm,  no murmurs and no lower extremity edema ABDOMEN: abdomen soft, non-tender, normoactive bowel sounds  Musculoskeletal: no cyanosis of digits and no clubbing  PSYCH: alert & oriented x 3 with fluent speech NEURO: no focal motor/sensory deficits  LABORATORY DATA:  I have reviewed the data as listed  . CBC Latest Ref Rng 01/18/2016 12/28/2015 12/21/2015  WBC 4.0 - 10.3 10e3/uL 10.0 8.2 39.1(H)  Hemoglobin 13.0 - 17.1 g/dL 10.8(L) 11.7(L) 10.8(L)  Hematocrit 38.4 - 49.9 % 33.0(L) 35.3(L) 32.6(L)  Platelets 140 - 400 10e3/uL 368 275 290   . CBC    Component Value Date/Time   WBC 10.0 01/18/2016 0911   WBC 5.4 12/08/2015 1149   RBC 3.51* 01/18/2016 0911   RBC 3.84* 12/08/2015 1149   HGB 10.8* 01/18/2016 0911   HGB 11.5* 12/08/2015 1149   HCT 33.0* 01/18/2016 0911   HCT 33.8* 12/08/2015 1149   PLT 368 01/18/2016 0911   PLT 428* 12/08/2015 1149   MCV 94.0 01/18/2016 0911   MCV 88.0 12/08/2015 1149   MCH  30.8 01/18/2016 0911   MCH 29.9 12/08/2015 1149   MCHC 32.7 01/18/2016 0911   MCHC 34.0 12/08/2015 1149   RDW 19.5* 01/18/2016 0911   RDW 15.7* 12/08/2015 1149   LYMPHSABS 2.5 01/18/2016 0911   LYMPHSABS 1.3 12/08/2015 1149   MONOABS 1.0* 01/18/2016 0911   MONOABS 0.5 12/08/2015 1149   EOSABS 0.1 01/18/2016 0911   EOSABS 0.1 12/08/2015 1149   BASOSABS 0.1 01/18/2016 0911   BASOSABS 0.0 12/08/2015 1149    CMP Latest Ref Rng 01/18/2016 12/28/2015 12/21/2015  Glucose 70 - 140 mg/dl 111  86 102  BUN 7.0 - 26.0 mg/dL 4.6(L) 9.0 11.7  Creatinine 0.7 - 1.3 mg/dL 0.7 0.7 0.7  Sodium 136 - 145 mEq/L 139 132(L) 136  Potassium 3.5 - 5.1 mEq/L 3.6 4.1 4.3  CO2 22 - 29 mEq/L 30(H) 29 33(H)  Calcium 8.4 - 10.4 mg/dL 9.9 9.4 9.4  Total Protein 6.4 - 8.3 g/dL 7.6 7.2 7.0  Total Bilirubin 0.20 - 1.20 mg/dL 0.30 0.68 <0.30  Alkaline Phos 40 - 150 U/L 107 105 151(H)  AST 5 - 34 U/L _0 ALT 0 - 55 U/L _1 RADIOGRAPHIC STUDIES: I have personally reviewed the radiological images as listed and agreed with the findings in the report. No results found.  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 S/p palliative radiation therapy to the esophagus and regional lymph nodes. He has tolerated treatment generally well except for some mild grade 2 radiation esophagitis which has now resolved. Repeat CT chest abdomen pelvis done 11/17/2015 show progression of his liver metastases and gastrohepatic lymph node as expected.  He has completed his 2 cycles of carboplatin and Taxol  He is here for follow-up prior to his third cycle. He notes no prohibitive toxicities at this time. He has no symptoms suggestive of disease progression at this time.  2) HTN- patient notes his appetite has improved and he is  eating better. On amlodipine. 3) Dysphagia due to esophageal SCC -continues to improve. 4) grade 2 radiation esophagitis. Resolved . Plan -Patient tolerated 2nd cycle of chemotherapy well with no acute toxicities. Minimal grade 1 fatigue. -.  Ativan and Zofran refill today - patient is appropriate to proceed with his third cycle of carboplatin plus Taxol at current doses and with current medications and G-CSF support. -I will see him again prior to his  Fourth cycle of carboplatin and Taxol with  Restaging CT scan of the chest abdomen pelvis - recommended continued optimization of his nutrition and maintaining good hydration. - continues to work 5 hours a day fairly comfortably and was commended on this.  All of his questions and concerns were answered in detail.   return to care with Dr. Irene Limbo in 3 weeks with repeat labs and CT chest abdomen pelvis.  I spent 15 minutes counseling the patient face to face. The total time spent in the appointment was 20 minutes and more than 50% was on counseling and direct patient cares.    Sullivan Lone MD [place] AAHIVMS [facility] Hematology/Oncology Physician [facility]  (Office):       [phone] (Work cell):  [phone] (Fax):           [phone]

## 2016-01-18 NOTE — Progress Notes (Signed)
Nutrition follow-up with patient during infusion for esophageal cancer. Patient reports he is eating well. Dysphasia has improved. He is consuming boost and is requesting coupons. Patient denies other nutrition impact symptoms. Weight documented as 126 pounds May 16 decreased slightly from 128.8 pounds on April 18.  Nutrition diagnosis: Unintended weight loss continues.  Intervention:  Patient educated to continue strategies for increased calories and protein to minimize weight loss. Encouraged patient to continue oral nutrition supplements.  Provided coupons. Teach back method used.  Monitoring, evaluation goals: Patient will tolerate adequate calories and protein to minimize weight loss.  Next visit:Tuesday, June 6 during infusion.  **Disclaimer: This note was dictated with voice recognition software. Similar sounding words can inadvertently be transcribed and this note may contain transcription errors which may not have been corrected upon publication of note.**

## 2016-01-18 NOTE — Telephone Encounter (Signed)
Gave pt apt & avs °

## 2016-01-18 NOTE — Patient Instructions (Signed)
[facility] Discharge Instructions for Patients Receiving Chemotherapy  Today you received the following chemotherapy agents Taxol and Carboplatin.  To help prevent nausea and vomiting after your treatment, we encourage you to take your nausea medication as prescribed.   If you develop nausea and vomiting that is not controlled by your nausea medication, call the clinic.   BELOW ARE SYMPTOMS THAT SHOULD BE REPORTED IMMEDIATELY:  *FEVER GREATER THAN 100.5 F  *CHILLS WITH OR WITHOUT FEVER  NAUSEA AND VOMITING THAT IS NOT CONTROLLED WITH YOUR NAUSEA MEDICATION  *UNUSUAL SHORTNESS OF BREATH  *UNUSUAL BRUISING OR BLEEDING  TENDERNESS IN MOUTH AND THROAT WITH OR WITHOUT PRESENCE OF ULCERS  *URINARY PROBLEMS  *BOWEL PROBLEMS  UNUSUAL RASH Items with * indicate a potential emergency and should be followed up as soon as possible.  Feel free to call the clinic you have any questions or concerns. The clinic phone number is [phone].  Please show the CHEMO ALERT CARD at check-in to the Emergency Department and triage nurse.   

## 2016-01-20 ENCOUNTER — Encounter: Payer: Self-pay | Admitting: Hematology

## 2016-01-20 ENCOUNTER — Ambulatory Visit (HOSPITAL_BASED_OUTPATIENT_CLINIC_OR_DEPARTMENT_OTHER): Payer: 59

## 2016-01-20 ENCOUNTER — Ambulatory Visit: Payer: 59

## 2016-01-20 VITALS — BP 120/70 | HR 76 | Temp 98.4°F | Resp 20

## 2016-01-20 DIAGNOSIS — Z5189 Encounter for other specified aftercare: Secondary | ICD-10-CM | POA: Diagnosis not present

## 2016-01-20 DIAGNOSIS — C154 Malignant neoplasm of middle third of esophagus: Secondary | ICD-10-CM | POA: Diagnosis not present

## 2016-01-20 DIAGNOSIS — C159 Malignant neoplasm of esophagus, unspecified: Secondary | ICD-10-CM

## 2016-01-20 DIAGNOSIS — C787 Secondary malignant neoplasm of liver and intrahepatic bile duct: Secondary | ICD-10-CM | POA: Diagnosis not present

## 2016-01-20 MED ORDER — PEGFILGRASTIM INJECTION 6 MG/0.6ML ~~LOC~~
6.0000 mg | PREFILLED_SYRINGE | Freq: Once | SUBCUTANEOUS | Status: AC
  Administered 2016-01-20: 6 mg via SUBCUTANEOUS
  Filled 2016-01-20: qty 0.6

## 2016-01-20 NOTE — Patient Instructions (Signed)
Pegfilgrastim injection What is this medicine? PEGFILGRASTIM (PEG fil gra stim) is a long-acting granulocyte colony-stimulating factor that stimulates the growth of neutrophils, a type of white blood cell important in the body's fight against infection. It is used to reduce the incidence of fever and infection in patients with certain types of cancer who are receiving chemotherapy that affects the bone marrow, and to increase survival after being exposed to high doses of radiation. This medicine may be used for other purposes; ask your health care provider or pharmacist if you have questions. What should I tell my health care provider before I take this medicine? They need to know if you have any of these conditions: -kidney disease -latex allergy -ongoing radiation therapy -sickle cell disease -skin reactions to acrylic adhesives (On-Body Injector only) -an unusual or allergic reaction to pegfilgrastim, filgrastim, other medicines, foods, dyes, or preservatives -pregnant or trying to get pregnant -breast-feeding How should I use this medicine? This medicine is for injection under the skin. If you get this medicine at home, you will be taught how to prepare and give the pre-filled syringe or how to use the On-body Injector. Refer to the patient Instructions for Use for detailed instructions. Use exactly as directed. Take your medicine at regular intervals. Do not take your medicine more often than directed. It is important that you put your used needles and syringes in a special sharps container. Do not put them in a trash can. If you do not have a sharps container, call your pharmacist or healthcare provider to get one. Talk to your pediatrician regarding the use of this medicine in children. While this drug may be prescribed for selected conditions, precautions do apply. Overdosage: If you think you have taken too much of this medicine contact a poison control center or emergency room at  once. NOTE: This medicine is only for you. Do not share this medicine with others. What if I miss a dose? It is important not to miss your dose. Call your doctor or health care professional if you miss your dose. If you miss a dose due to an On-body Injector failure or leakage, a new dose should be administered as soon as possible using a single prefilled syringe for manual use. What may interact with this medicine? Interactions have not been studied. Give your health care provider a list of all the medicines, herbs, non-prescription drugs, or dietary supplements you use. Also tell them if you smoke, drink alcohol, or use illegal drugs. Some items may interact with your medicine. This list may not describe all possible interactions. Give your health care provider a list of all the medicines, herbs, non-prescription drugs, or dietary supplements you use. Also tell them if you smoke, drink alcohol, or use illegal drugs. Some items may interact with your medicine. What should I watch for while using this medicine? You may need blood work done while you are taking this medicine. If you are going to need a MRI, CT scan, or other procedure, tell your doctor that you are using this medicine (On-Body Injector only). What side effects may I notice from receiving this medicine? Side effects that you should report to your doctor or health care professional as soon as possible: -allergic reactions like skin rash, itching or hives, swelling of the face, lips, or tongue -dizziness -fever -pain, redness, or irritation at site where injected -pinpoint red spots on the skin -red or dark-brown urine -shortness of breath or breathing problems -stomach or side pain, or pain   at the shoulder -swelling -tiredness -trouble passing urine or change in the amount of urine Side effects that usually do not require medical attention (report to your doctor or health care professional if they continue or are  bothersome): -bone pain -muscle pain This list may not describe all possible side effects. Call your doctor for medical advice about side effects. You may report side effects to FDA at 1-800-FDA-1088. Where should I keep my medicine? Keep out of the reach of children. Store pre-filled syringes in a refrigerator between 2 and 8 degrees C (36 and 46 degrees F). Do not freeze. Keep in carton to protect from light. Throw away this medicine if it is left out of the refrigerator for more than 48 hours. Throw away any unused medicine after the expiration date. NOTE: This sheet is a summary. It may not cover all possible information. If you have questions about this medicine, talk to your doctor, pharmacist, or health care provider.    2016, Elsevier/Gold Standard. (2014-09-10 14:30:14)  

## 2016-01-20 NOTE — Progress Notes (Signed)
Met with patient and mother to complete [employer]. Patient approved for $400 [employer]. Patient has a copy of award as well as expenses it covers along with our outpatient pharmacy information. Patient asked if insurance ends, what he could do. Advised patient uninsured patients receive a 55% discount and he could apply for financial assistance to possibly receive more. He would also be screened for charity care. Discussed Medicaid, Medicare, Marketplace and provided application for Medicaid and Financial Assistance. Advised patient of co-pay responsibility and amount showing in self-pay. Patient wanted to know how to set up payment arrangements for balance and where he could pay. Advised patient he could pay here and or call the billing number on the bills to set up payment arrangements. Gave information on [employer] that he could inquire about to billing. Patient has my card for any additional financial questions or concerns.

## 2016-02-03 ENCOUNTER — Ambulatory Visit (HOSPITAL_COMMUNITY)
Admission: RE | Admit: 2016-02-03 | Discharge: 2016-02-03 | Disposition: A | Discharge status: Home / Self Care | Payer: 59 | Source: Ambulatory Visit | Attending: Hematology | Admitting: Hematology

## 2016-02-03 ENCOUNTER — Encounter (HOSPITAL_COMMUNITY): Payer: Self-pay

## 2016-02-03 DIAGNOSIS — M47816 Spondylosis without myelopathy or radiculopathy, lumbar region: Secondary | ICD-10-CM | POA: Diagnosis not present

## 2016-02-03 DIAGNOSIS — R590 Localized enlarged lymph nodes: Secondary | ICD-10-CM | POA: Insufficient documentation

## 2016-02-03 DIAGNOSIS — M899 Disorder of bone, unspecified: Secondary | ICD-10-CM | POA: Diagnosis not present

## 2016-02-03 DIAGNOSIS — C155 Malignant neoplasm of lower third of esophagus: Secondary | ICD-10-CM

## 2016-02-03 DIAGNOSIS — C787 Secondary malignant neoplasm of liver and intrahepatic bile duct: Secondary | ICD-10-CM

## 2016-02-03 DIAGNOSIS — I7090 Unspecified atherosclerosis: Secondary | ICD-10-CM | POA: Diagnosis not present

## 2016-02-03 DIAGNOSIS — M5137 Other intervertebral disc degeneration, lumbosacral region: Secondary | ICD-10-CM | POA: Insufficient documentation

## 2016-02-03 DIAGNOSIS — I313 Pericardial effusion (noninflammatory): Secondary | ICD-10-CM | POA: Insufficient documentation

## 2016-02-03 MED ORDER — IOPAMIDOL (ISOVUE-300) INJECTION 61%
100.0000 mL | Freq: Once | INTRAVENOUS | Status: AC | PRN
  Administered 2016-02-03: 100 mL via INTRAVENOUS

## 2016-02-08 ENCOUNTER — Ambulatory Visit (HOSPITAL_BASED_OUTPATIENT_CLINIC_OR_DEPARTMENT_OTHER): Payer: 59 | Admitting: Hematology

## 2016-02-08 ENCOUNTER — Ambulatory Visit (HOSPITAL_BASED_OUTPATIENT_CLINIC_OR_DEPARTMENT_OTHER): Payer: 59

## 2016-02-08 ENCOUNTER — Encounter: Payer: Self-pay | Admitting: Hematology

## 2016-02-08 ENCOUNTER — Ambulatory Visit: Payer: 59

## 2016-02-08 ENCOUNTER — Telehealth: Payer: Self-pay | Admitting: *Deleted

## 2016-02-08 ENCOUNTER — Other Ambulatory Visit: Payer: Self-pay | Admitting: *Deleted

## 2016-02-08 ENCOUNTER — Other Ambulatory Visit (HOSPITAL_BASED_OUTPATIENT_CLINIC_OR_DEPARTMENT_OTHER): Payer: 59

## 2016-02-08 ENCOUNTER — Encounter: Payer: Self-pay | Admitting: *Deleted

## 2016-02-08 ENCOUNTER — Ambulatory Visit: Payer: 59 | Admitting: Nutrition

## 2016-02-08 ENCOUNTER — Telehealth: Payer: Self-pay | Admitting: Hematology

## 2016-02-08 VITALS — BP 114/69 | HR 69 | Temp 98.3°F | Resp 17 | Ht 69.0 in | Wt 131.2 lb

## 2016-02-08 DIAGNOSIS — C7951 Secondary malignant neoplasm of bone: Secondary | ICD-10-CM

## 2016-02-08 DIAGNOSIS — C154 Malignant neoplasm of middle third of esophagus: Secondary | ICD-10-CM

## 2016-02-08 DIAGNOSIS — C787 Secondary malignant neoplasm of liver and intrahepatic bile duct: Secondary | ICD-10-CM

## 2016-02-08 DIAGNOSIS — Z5111 Encounter for antineoplastic chemotherapy: Secondary | ICD-10-CM | POA: Diagnosis not present

## 2016-02-08 DIAGNOSIS — C155 Malignant neoplasm of lower third of esophagus: Secondary | ICD-10-CM

## 2016-02-08 DIAGNOSIS — C159 Malignant neoplasm of esophagus, unspecified: Secondary | ICD-10-CM

## 2016-02-08 DIAGNOSIS — Z95828 Presence of other vascular implants and grafts: Secondary | ICD-10-CM | POA: Insufficient documentation

## 2016-02-08 DIAGNOSIS — M8589 Other specified disorders of bone density and structure, multiple sites: Secondary | ICD-10-CM | POA: Diagnosis not present

## 2016-02-08 LAB — CBC & DIFF AND RETIC
BASO%: 0 % (ref 0.0–2.0)
BASOS ABS: 0 10*3/uL (ref 0.0–0.1)
EOS ABS: 0 10*3/uL (ref 0.0–0.5)
EOS%: 0 % (ref 0.0–7.0)
HEMATOCRIT: 32.6 % — AB (ref 38.4–49.9)
HEMOGLOBIN: 10.9 g/dL — AB (ref 13.0–17.1)
Immature Retic Fract: 9.1 % (ref 3.00–10.60)
LYMPH%: 4.6 % — AB (ref 14.0–49.0)
MCH: 32.3 pg (ref 27.2–33.4)
MCHC: 33.4 g/dL (ref 32.0–36.0)
MCV: 96.7 fL (ref 79.3–98.0)
MONO#: 0.5 10*3/uL (ref 0.1–0.9)
MONO%: 3.2 % (ref 0.0–14.0)
NEUT#: 14.8 10*3/uL — ABNORMAL HIGH (ref 1.5–6.5)
NEUT%: 92.2 % — AB (ref 39.0–75.0)
Platelets: 363 10*3/uL (ref 140–400)
RBC: 3.37 10*6/uL — ABNORMAL LOW (ref 4.20–5.82)
RDW: 21.2 % — ABNORMAL HIGH (ref 11.0–14.6)
Retic %: 2.51 % — ABNORMAL HIGH (ref 0.80–1.80)
Retic Ct Abs: 84.59 10*3/uL (ref 34.80–93.90)
WBC: 16 10*3/uL — ABNORMAL HIGH (ref 4.0–10.3)
lymph#: 0.7 10*3/uL — ABNORMAL LOW (ref 0.9–3.3)

## 2016-02-08 LAB — COMPREHENSIVE METABOLIC PANEL
ALBUMIN: 3.7 g/dL (ref 3.5–5.0)
ALK PHOS: 113 U/L (ref 40–150)
ALT: 13 U/L (ref 0–55)
AST: 12 U/L (ref 5–34)
Anion Gap: 11 mEq/L (ref 3–11)
BUN: 13.9 mg/dL (ref 7.0–26.0)
CALCIUM: 9.5 mg/dL (ref 8.4–10.4)
CO2: 28 mEq/L (ref 22–29)
Chloride: 99 mEq/L (ref 98–109)
Creatinine: 0.8 mg/dL (ref 0.7–1.3)
GLUCOSE: 119 mg/dL (ref 70–140)
POTASSIUM: 3.7 meq/L (ref 3.5–5.1)
Sodium: 137 mEq/L (ref 136–145)
Total Bilirubin: 0.38 mg/dL (ref 0.20–1.20)
Total Protein: 7.2 g/dL (ref 6.4–8.3)

## 2016-02-08 MED ORDER — DENOSUMAB 120 MG/1.7ML ~~LOC~~ SOLN
120.0000 mg | Freq: Once | SUBCUTANEOUS | Status: AC
  Administered 2016-02-08: 120 mg via SUBCUTANEOUS
  Filled 2016-02-08: qty 1.7

## 2016-02-08 MED ORDER — SODIUM CHLORIDE 0.9 % IJ SOLN
10.0000 mL | INTRAMUSCULAR | Status: DC | PRN
  Administered 2016-02-08: 10 mL via INTRAVENOUS
  Filled 2016-02-08: qty 10

## 2016-02-08 MED ORDER — PALONOSETRON HCL INJECTION 0.25 MG/5ML
INTRAVENOUS | Status: AC
  Filled 2016-02-08: qty 5

## 2016-02-08 MED ORDER — FAMOTIDINE IN NACL 20-0.9 MG/50ML-% IV SOLN
20.0000 mg | Freq: Once | INTRAVENOUS | Status: AC
  Administered 2016-02-08: 20 mg via INTRAVENOUS

## 2016-02-08 MED ORDER — SODIUM CHLORIDE 0.9 % IV SOLN
Freq: Once | INTRAVENOUS | Status: AC
  Administered 2016-02-08: 11:00:00 via INTRAVENOUS

## 2016-02-08 MED ORDER — OXYCODONE HCL 5 MG/5ML PO SOLN: 5.0000 mg | ORAL | Status: DC | PRN

## 2016-02-08 MED ORDER — DIPHENHYDRAMINE HCL 50 MG/ML IJ SOLN
INTRAMUSCULAR | Status: AC
  Filled 2016-02-08: qty 1

## 2016-02-08 MED ORDER — PALONOSETRON HCL INJECTION 0.25 MG/5ML
0.2500 mg | Freq: Once | INTRAVENOUS | Status: AC
  Administered 2016-02-08: 0.25 mg via INTRAVENOUS

## 2016-02-08 MED ORDER — SODIUM CHLORIDE 0.9 % IV SOLN
540.0000 mg | Freq: Once | INTRAVENOUS | Status: AC
  Administered 2016-02-08: 540 mg via INTRAVENOUS
  Filled 2016-02-08: qty 54

## 2016-02-08 MED ORDER — PACLITAXEL CHEMO INJECTION 300 MG/50ML
175.0000 mg/m2 | Freq: Once | INTRAVENOUS | Status: AC
  Administered 2016-02-08: 294 mg via INTRAVENOUS
  Filled 2016-02-08: qty 49

## 2016-02-08 MED ORDER — SODIUM CHLORIDE 0.9% FLUSH
10.0000 mL | INTRAVENOUS | Status: DC | PRN
  Administered 2016-02-08: 10 mL
  Filled 2016-02-08: qty 10

## 2016-02-08 MED ORDER — SUCRALFATE 1 GM/10ML PO SUSP: 1.0000 g | Freq: Three times a day (TID) | ORAL | Status: DC

## 2016-02-08 MED ORDER — SODIUM CHLORIDE 0.9 % IV SOLN
20.0000 mg | Freq: Once | INTRAVENOUS | Status: AC
  Administered 2016-02-08: 20 mg via INTRAVENOUS
  Filled 2016-02-08: qty 2

## 2016-02-08 MED ORDER — FAMOTIDINE IN NACL 20-0.9 MG/50ML-% IV SOLN
INTRAVENOUS | Status: AC
  Filled 2016-02-08: qty 50

## 2016-02-08 MED ORDER — HEPARIN SOD (PORK) LOCK FLUSH 100 UNIT/ML IV SOLN
500.0000 [IU] | Freq: Once | INTRAVENOUS | Status: AC | PRN
  Administered 2016-02-08: 500 [IU]
  Filled 2016-02-08: qty 5

## 2016-02-08 MED ORDER — DIPHENHYDRAMINE HCL 50 MG/ML IJ SOLN
50.0000 mg | Freq: Once | INTRAMUSCULAR | Status: AC
  Administered 2016-02-08: 50 mg via INTRAVENOUS

## 2016-02-08 NOTE — Patient Instructions (Signed)

## 2016-02-08 NOTE — Telephone Encounter (Signed)
left msg confirming 6/27 apt .

## 2016-02-08 NOTE — Progress Notes (Signed)
Oncology Nurse Navigator Documentation  Oncology Nurse Navigator Flowsheets 02/08/2016  Navigator Location [facility]-Med Onc  Navigator Encounter Type Treatment  Telephone -  Abnormal Finding Date -  Confirmed Diagnosis Date -  Treatment Initiated Date -  Patient Visit Type MedOnc  Treatment Phase Active Tx--Taxol/Carbo #4  Barriers/Navigation Needs No barriers at this time;No Questions;No Needs  Education -  Interventions None required--tolerating tx well. 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  Referrals -  Coordination of Care -  Education Method -  Support Groups/Services GI Support Group  Acuity Level 1  Acuity Level 2 -  Time Spent with Patient 15

## 2016-02-08 NOTE — Patient Instructions (Signed)
[facility] Discharge Instructions for Patients Receiving Chemotherapy  Today you received the following chemotherapy agents:  Taxol and Carboplatin.  To help prevent nausea and vomiting after your treatment, we encourage you to take your nausea medication as prescribed.   If you develop nausea and vomiting that is not controlled by your nausea medication, call the clinic.   BELOW ARE SYMPTOMS THAT SHOULD BE REPORTED IMMEDIATELY:  *FEVER GREATER THAN 100.5 F  *CHILLS WITH OR WITHOUT FEVER  NAUSEA AND VOMITING THAT IS NOT CONTROLLED WITH YOUR NAUSEA MEDICATION  *UNUSUAL SHORTNESS OF BREATH  *UNUSUAL BRUISING OR BLEEDING  TENDERNESS IN MOUTH AND THROAT WITH OR WITHOUT PRESENCE OF ULCERS  *URINARY PROBLEMS  *BOWEL PROBLEMS  UNUSUAL RASH Items with * indicate a potential emergency and should be followed up as soon as possible.  Feel free to call the clinic you have any questions or concerns. The clinic phone number is [phone].  Please show the [place] at check-in to the Emergency Department and triage nurse.  Denosumab injection What is this medicine? DENOSUMAB (den oh sue mab) slows bone breakdown. Prolia is used to treat osteoporosis in women after menopause and in men. Delton See is used to prevent bone fractures and other bone problems caused by cancer bone metastases. Delton See is also used to treat giant cell tumor of the bone. This medicine may be used for other purposes; ask your health care provider or pharmacist if you have questions. What should I tell my health care provider before I take this medicine? They need to know if you have any of these conditions: -dental disease -eczema -infection or history of infections -kidney disease or on dialysis -low blood calcium or vitamin D -malabsorption syndrome -scheduled to have surgery or tooth extraction -taking medicine that contains denosumab -thyroid or parathyroid disease -an unusual reaction  to denosumab, other medicines, foods, dyes, or preservatives -pregnant or trying to get pregnant -breast-feeding How should I use this medicine? This medicine is for injection under the skin. It is given by a health care professional in a hospital or clinic setting. If you are getting Prolia, a special MedGuide will be given to you by the pharmacist with each prescription and refill. Be sure to read this information carefully each time. For Prolia, talk to your pediatrician regarding the use of this medicine in children. Special care may be needed. For Delton See, talk to your pediatrician regarding the use of this medicine in children. While this drug may be prescribed for children as young as 13 years for selected conditions, precautions do apply. Overdosage: If you think you have taken too much of this medicine contact a poison control center or emergency room at once. NOTE: This medicine is only for you. Do not share this medicine with others. What if I miss a dose? It is important not to miss your dose. Call your doctor or health care professional if you are unable to keep an appointment. What may interact with this medicine? Do not take this medicine with any of the following medications: -other medicines containing denosumab This medicine may also interact with the following medications: -medicines that suppress the immune system -medicines that treat cancer -steroid medicines like prednisone or cortisone This list may not describe all possible interactions. Give your health care provider a list of all the medicines, herbs, non-prescription drugs, or dietary supplements you use. Also tell them if you smoke, drink alcohol, or use illegal drugs. Some items may interact with your medicine.  What should I watch for while using this medicine? Visit your doctor or health care professional for regular checks on your progress. Your doctor or health care professional may order blood tests and other tests  to see how you are doing. Call your doctor or health care professional if you get a cold or other infection while receiving this medicine. Do not treat yourself. This medicine may decrease your body's ability to fight infection. You should make sure you get enough calcium and vitamin D while you are taking this medicine, unless your doctor tells you not to. Discuss the foods you eat and the vitamins you take with your health care professional. See your dentist regularly. Brush and floss your teeth as directed. Before you have any dental work done, tell your dentist you are receiving this medicine. Do not become pregnant while taking this medicine or for 5 months after stopping it. Women should inform their doctor if they wish to become pregnant or think they might be pregnant. There is a potential for serious side effects to an unborn child. Talk to your health care professional or pharmacist for more information. What side effects may I notice from receiving this medicine? Side effects that you should report to your doctor or health care professional as soon as possible: -allergic reactions like skin rash, itching or hives, swelling of the face, lips, or tongue -breathing problems -chest pain -fast, irregular heartbeat -feeling faint or lightheaded, falls -fever, chills, or any other sign of infection -muscle spasms, tightening, or twitches -numbness or tingling -skin blisters or bumps, or is dry, peels, or red -slow healing or unexplained pain in the mouth or jaw -unusual bleeding or bruising Side effects that usually do not require medical attention (Report these to your doctor or health care professional if they continue or are bothersome.): -muscle pain -stomach upset, gas This list may not describe all possible side effects. Call your doctor for medical advice about side effects. You may report side effects to FDA at 1-800-FDA-1088. Where should I keep my medicine? This medicine is only  given in a clinic, doctor's office, or other health care setting and will not be stored at home. NOTE: This sheet is a summary. It may not cover all possible information. If you have questions about this medicine, talk to your doctor, pharmacist, or health care provider.    2016, Elsevier/Gold Standard. (2012-02-19 12:37:47)

## 2016-02-08 NOTE — Telephone Encounter (Signed)
Per staff message and POF I have scheduled appts. Advised scheduler of appts. JMW  

## 2016-02-08 NOTE — Progress Notes (Signed)
Nutrition follow-up completed with patient during infusion for esophageal cancer. Patient was sleeping soundly so I did not awaken Spoke with patient's wife who reports he is eating better. He continues to consume boost. Weight improved documented as 131.2 pounds June 6 increased from 126 pounds May 16. No nutrition impact symptoms.  Nutrition diagnosis: Unintended weight loss has improved.  Intervention:  Encouraged patient's wife to continue to offer patient a variety of high-calorie, high-protein foods. Recommended patient continue oral nutrition supplements. Teach back method used.  Monitoring, evaluation, goals: Patient will continue to tolerate increased calories and protein to promote weight gain.  Next visit: To be scheduled as needed.  **Disclaimer: This note was dictated with voice recognition software. Similar sounding words can inadvertently be transcribed and this note may contain transcription errors which may not have been corrected upon publication of note.**

## 2016-02-10 ENCOUNTER — Ambulatory Visit (HOSPITAL_BASED_OUTPATIENT_CLINIC_OR_DEPARTMENT_OTHER): Payer: 59

## 2016-02-10 VITALS — BP 122/80 | HR 63 | Temp 98.4°F | Resp 20

## 2016-02-10 DIAGNOSIS — C159 Malignant neoplasm of esophagus, unspecified: Secondary | ICD-10-CM

## 2016-02-10 DIAGNOSIS — C787 Secondary malignant neoplasm of liver and intrahepatic bile duct: Secondary | ICD-10-CM

## 2016-02-10 DIAGNOSIS — C154 Malignant neoplasm of middle third of esophagus: Secondary | ICD-10-CM | POA: Diagnosis not present

## 2016-02-10 MED ORDER — PEGFILGRASTIM INJECTION 6 MG/0.6ML ~~LOC~~
6.0000 mg | PREFILLED_SYRINGE | Freq: Once | SUBCUTANEOUS | Status: AC
  Administered 2016-02-10: 6 mg via SUBCUTANEOUS
  Filled 2016-02-10: qty 0.6

## 2016-02-10 MED FILL — oxyCODONE HCL 5 MG/5ML SOLN: 5 | 4 days supply | Qty: 200 | Fill #0

## 2016-02-10 NOTE — Patient Instructions (Signed)
Pegfilgrastim injection What is this medicine? PEGFILGRASTIM (PEG fil gra stim) is a long-acting granulocyte colony-stimulating factor that stimulates the growth of neutrophils, a type of white blood cell important in the body's fight against infection. It is used to reduce the incidence of fever and infection in patients with certain types of cancer who are receiving chemotherapy that affects the bone marrow, and to increase survival after being exposed to high doses of radiation. This medicine may be used for other purposes; ask your health care provider or pharmacist if you have questions. What should I tell my health care provider before I take this medicine? They need to know if you have any of these conditions: -kidney disease -latex allergy -ongoing radiation therapy -sickle cell disease -skin reactions to acrylic adhesives (On-Body Injector only) -an unusual or allergic reaction to pegfilgrastim, filgrastim, other medicines, foods, dyes, or preservatives -pregnant or trying to get pregnant -breast-feeding How should I use this medicine? This medicine is for injection under the skin. If you get this medicine at home, you will be taught how to prepare and give the pre-filled syringe or how to use the On-body Injector. Refer to the patient Instructions for Use for detailed instructions. Use exactly as directed. Take your medicine at regular intervals. Do not take your medicine more often than directed. It is important that you put your used needles and syringes in a special sharps container. Do not put them in a trash can. If you do not have a sharps container, call your pharmacist or healthcare provider to get one. Talk to your pediatrician regarding the use of this medicine in children. While this drug may be prescribed for selected conditions, precautions do apply. Overdosage: If you think you have taken too much of this medicine contact a poison control center or emergency room at  once. NOTE: This medicine is only for you. Do not share this medicine with others. What if I miss a dose? It is important not to miss your dose. Call your doctor or health care professional if you miss your dose. If you miss a dose due to an On-body Injector failure or leakage, a new dose should be administered as soon as possible using a single prefilled syringe for manual use. What may interact with this medicine? Interactions have not been studied. Give your health care provider a list of all the medicines, herbs, non-prescription drugs, or dietary supplements you use. Also tell them if you smoke, drink alcohol, or use illegal drugs. Some items may interact with your medicine. This list may not describe all possible interactions. Give your health care provider a list of all the medicines, herbs, non-prescription drugs, or dietary supplements you use. Also tell them if you smoke, drink alcohol, or use illegal drugs. Some items may interact with your medicine. What should I watch for while using this medicine? You may need blood work done while you are taking this medicine. If you are going to need a MRI, CT scan, or other procedure, tell your doctor that you are using this medicine (On-Body Injector only). What side effects may I notice from receiving this medicine? Side effects that you should report to your doctor or health care professional as soon as possible: -allergic reactions like skin rash, itching or hives, swelling of the face, lips, or tongue -dizziness -fever -pain, redness, or irritation at site where injected -pinpoint red spots on the skin -red or dark-brown urine -shortness of breath or breathing problems -stomach or side pain, or pain   at the shoulder -swelling -tiredness -trouble passing urine or change in the amount of urine Side effects that usually do not require medical attention (report to your doctor or health care professional if they continue or are  bothersome): -bone pain -muscle pain This list may not describe all possible side effects. Call your doctor for medical advice about side effects. You may report side effects to FDA at 1-800-FDA-1088. Where should I keep my medicine? Keep out of the reach of children. Store pre-filled syringes in a refrigerator between 2 and 8 degrees C (36 and 46 degrees F). Do not freeze. Keep in carton to protect from light. Throw away this medicine if it is left out of the refrigerator for more than 48 hours. Throw away any unused medicine after the expiration date. NOTE: This sheet is a summary. It may not cover all possible information. If you have questions about this medicine, talk to your doctor, pharmacist, or health care provider.    2016, Elsevier/Gold Standard. (2014-09-10 14:30:14)  

## 2016-02-11 NOTE — Progress Notes (Signed)
Samuel Henderson    HEMATOLOGY/ONCOLOGY CLINIC NOTE  Date of Service: 02/11/2016   Patient Care Team: Leanna Battles, MD as PCP - General (Internal Medicine)  CHIEF COMPLAINTS/PURPOSE OF CONSULTATION:  Newly diagnosed Esophageal Squamous cell carcinoma   HISTORY OF PRESENTING ILLNESS: plz see my initial consultation regarding patients initial presentation  INTERVAL HISTORY  Mr Bramer is here for follow-up  Prior to cycle 4 of his planned carboplatin Taxol palliative chemotherapy for metastatic esophageal carcinoma. He notes no nausea no vomiting. No notable dysphagia at this time.   No fevers no chills no night sweats. Breathing is stable. CT chest abdomen pelvis showed significant response in his liver metastases and gastrohepatic ligament lymph node.  Few new areas of concern but not definitive for bone metastases. We discussed the pros and cons of starting Xgeva and the patient was agreeable to this. No other acute new focal symptoms.  MEDICAL HISTORY:  Past Medical History  Diagnosis Date  . Hypertension   . Arthritis   . Knee pain   . Esophageal cancer [facility])     SURGICAL HISTORY: Past Surgical History  Procedure Laterality Date  . Knee surgery    . Lesion removal  1969    hip  . Esophagogastroduodenoscopy (egd) with esophageal dilation      and biopsy    SOCIAL HISTORY: Social History   Social History  . Marital Status: Single    Spouse Name: N/A  . Number of Children: 2  . Years of Education: N/A   Occupational History  . Manufacturing industrial curtains    Social History Main Topics  . Smoking status: Current Every Day Smoker -- 0.25 packs/day for 20 years    Types: Cigarettes  . Smokeless tobacco: Never Used  . Alcohol Use: No  . Drug Use: Yes    Special: Marijuana     Comment: last smoked 1 week ago  . Sexual Activity: Yes   Other Topics Concern  . 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 Sober for 15 yrs  Ex smoker 1/2 PPD x 23yr quit about 18 yrs ago.  FAMILY HISTORY: Family History  Problem Relation Age of Onset  . Colon cancer Neg Hx   . Esophageal cancer Father   . Cancer Father     nasopharyngeal   . Diabetes Sister   . Cancer Sister     multiple myeloma    ALLERGIES:  has No Known Allergies.  MEDICATIONS:  Current Outpatient Prescriptions  Medication Sig Dispense Refill  . amLODipine (NORVASC) 5 MG tablet Take 5 mg by mouth daily.    .Samuel Kitchendexamethasone (DECADRON) 4 MG tablet Take 2 tablets (8 mg total) by mouth daily. Start the day after chemotherapy for 4 days. 30 tablet 1  . hydrocortisone (CORTEF) 20 MG tablet     . lidocaine-prilocaine (EMLA) cream Apply 1 application topically once. 30 g 1  . lisinopril-hydrochlorothiazide (PRINZIDE,ZESTORETIC) 10-12.5 MG tablet     . LORazepam (ATIVAN) 0.5 MG tablet Take 1 tablet (0.5 mg total) by mouth every 6 (six) hours as needed (Nausea or vomiting). 30 tablet 0  . meloxicam (MOBIC) 15 MG tablet Take 15 mg by mouth daily.    .Samuel Henderson  omeprazole (PRILOSEC) 20 MG capsule Take 1 capsule (20 mg total) by mouth 2 (two) times daily before a meal. 60 capsule 2  . ondansetron (ZOFRAN) 8 MG tablet Take 1 tablet (8 mg total) by mouth every 8 (eight) hours as needed for nausea. 30 tablet 3  . oxyCODONE (ROXICODONE) 5 MG/5ML solution Take 5-10 mLs (5-10 mg total) by mouth every 4 (four) hours as needed for moderate pain or severe pain. 200 mL 0  . prochlorperazine (COMPAZINE) 10 MG tablet Take 1 tablet (10 mg total) by mouth every 6 (six) hours as needed (Nausea or vomiting). 30 tablet 1  . sucralfate (CARAFATE) 1 GM/10ML suspension Take 10 mLs (1 g total) by mouth 4 (four) times daily -  with meals and at bedtime. 420 mL 0  . VIAGRA 100 MG tablet  Reported on 10/22/2015     No current facility-administered medications for this visit.    REVIEW OF SYSTEMS:    10 Point review of Systems was done is negative except as noted above.  PHYSICAL EXAMINATION: ECOG PERFORMANCE STATUS: 1 - Symptomatic but completely ambulatory  . Filed Vitals:   02/08/16 0931  BP: 114/69  Pulse: 69  Temp: 98.3 F (36.8 C)  Resp: 17   Filed Weights   02/08/16 0931  Weight: 131 lb 3.2 oz (59.512 kg)   .Body mass index is 19.37 kg/(m^2).  Samuel Henderson Wt Readings from Last 3 Encounters:  02/08/16 131 lb 3.2 oz (59.512 kg)  01/18/16 126 lb (57.153 kg)  12/21/15 128 lb 12.8 oz (58.423 kg)   GENERAL:middle aged AAM ,alert, in no acute distress and comfortable SKIN: skin color, texture, turgor are normal, no rashes or significant lesions EYES: normal, conjunctiva are pink and non-injected, sclera clear OROPHARYNX:no exudate, no erythema and lips, buccal mucosa, and tongue normal  NECK: supple, no JVD, thyroid normal size, non-tender, without nodularity LYMPH:  no palpable lymphadenopathy in the cervical, axillary or inguinal LUNGS: clear to auscultation with normal respiratory effort HEART: regular rate & rhythm,  no murmurs and no lower extremity edema ABDOMEN: abdomen soft, non-tender, normoactive bowel sounds  Musculoskeletal: no cyanosis of digits and no clubbing  PSYCH: alert & oriented x 3 with fluent speech NEURO: no focal motor/sensory deficits  LABORATORY DATA:  I have reviewed the data as listed  . CBC Latest Ref Rng 02/08/2016 01/18/2016 12/28/2015  WBC 4.0 - 10.3 10e3/uL 16.0(H) 10.0 8.2  Hemoglobin 13.0 - 17.1 g/dL 10.9(L) 10.8(L) 11.7(L)  Hematocrit 38.4 - 49.9 % 32.6(L) 33.0(L) 35.3(L)  Platelets 140 - 400 10e3/uL 363 368 275   . CBC    Component Value Date/Time   WBC 16.0* 02/08/2016 0915   WBC 5.4 12/08/2015 1149   RBC 3.37* 02/08/2016 0915   RBC 3.84* 12/08/2015 1149   HGB 10.9* 02/08/2016 0915   HGB 11.5* 12/08/2015 1149    HCT 32.6* 02/08/2016 0915   HCT 33.8* 12/08/2015 1149   PLT 363 02/08/2016 0915   PLT 428* 12/08/2015 1149   MCV 96.7 02/08/2016 0915   MCV 88.0 12/08/2015 1149   MCH 32.3 02/08/2016 0915   MCH 29.9 12/08/2015 1149   MCHC 33.4 02/08/2016 0915   MCHC 34.0 12/08/2015 1149   RDW 21.2* 02/08/2016 0915   RDW 15.7* 12/08/2015 1149   LYMPHSABS 0.7* 02/08/2016 0915   LYMPHSABS 1.3 12/08/2015 1149   MONOABS 0.5 02/08/2016 0915   MONOABS 0.5 12/08/2015 1149   EOSABS 0.0 02/08/2016 0915   EOSABS 0.1 12/08/2015 1149   BASOSABS 0.0  02/08/2016 0915   BASOSABS 0.0 12/08/2015 1149    CMP Latest Ref Rng 02/08/2016 01/18/2016 12/28/2015  Glucose 70 - 140 mg/dl 119 111 86  BUN 7.0 - 26.0 mg/dL 13.9 4.6(L) 9.0  Creatinine 0.7 - 1.3 mg/dL 0.8 0.7 0.7  Sodium 136 - 145 mEq/L 137 139 132(L)  Potassium 3.5 - 5.1 mEq/L 3.7 3.6 4.1  CO2 22 - 29 mEq/L 28 30(H) 29  Calcium 8.4 - 10.4 mg/dL 9.5 9.9 9.4  Total Protein 6.4 - 8.3 g/dL 7.2 7.6 7.2  Total Bilirubin 0.20 - 1.20 mg/dL 0.38 0.30 0.68  Alkaline Phos 40 - 150 U/L 113 107 105  AST 5 - 34 U/L 12 22 23   ALT 0 - 55 U/L 13 16 24     RADIOGRAPHIC STUDIES: I have personally reviewed the radiological images as listed and agreed with the findings in the report. Ct Chest W Contrast  02/03/2016  CLINICAL DATA:  Metastatic esophageal cancer.  Ongoing chemotherapy. EXAM: CT CHEST, ABDOMEN, AND PELVIS WITH CONTRAST TECHNIQUE: Multidetector CT imaging of the chest, abdomen and pelvis was performed following the standard protocol during bolus administration of intravenous contrast. CONTRAST:  122m ISOVUE-300 IOPAMIDOL (ISOVUE-300) INJECTION 61% COMPARISON:  Multiple exams, including 11/17/2015 FINDINGS: CT CHEST FINDINGS Mediastinum/Nodes: Small pericardial effusion. Subcarinal lymph node 0.7 cm in short axis on image 31/2, previously 0.6 cm. Right supraclavicular node 0.6 cm in short axis on image 6/2, formerly 0.9 cm. Coronary, aortic arch, and branch vessel  atherosclerotic vascular disease. There is some equivocal upper esophageal wall thickening and potentially some distal esophageal wall thickening at the gastroesophageal junction. Lungs/Pleura: No significant lung nodules.  The lungs appear clear. Musculoskeletal: Old left rib deformities from prior fractures. Old right anterior fourth and fifth rib deformities likely related to old fractures. New 1.8 by 1.0 cm lucent/lytic lesion along the right posterior margin of the T1 vertebral body, image 9/4. Acute fracture of the right first rib, image 12/4. CT ABDOMEN PELVIS FINDINGS Hepatobiliary: In general there is a significant reduction in the overall size of the hepatic tumors. The lesion in the dome of segment 4a previously measured 3.9 by 3.4 cm and currently measures 1.5 by 1.5 cm. The anterior segment 4 lesion previously measuring 5.0 by 3.7 cm currently measures 2.4 by 1.9 cm on image 58/2. Pancreas: Unremarkable Spleen: 2.1 by 1.8 cm fluid density lesion, image 55 series 2, stable. Adrenals/Urinary Tract: 7 mm hypodense lesion in the left mid kidney, image 24/5, stable, likely a cyst. Adrenal glands normal. Stomach/Bowel: Unremarkable Vascular/Lymphatic: Adenopathy/ mass in the gastrohepatic ligament 2.8 by 1.6 cm on image 59/2, formerly 2.6 by 5.2 cm. Aortoiliac atherosclerotic vascular disease. Reproductive: Unremarkable Other: Subtle perirectal stranding/hypervascularity, questionable significance. Musculoskeletal: 1.3 by 0.8 cm sclerotic focus along the superior endplate of L3 on image 82/602, new compared to the prior exam, possibly reactive findings from a Schmorl 's node but a bony metastatic lesion is difficult to exclude. There is some lucency around the base of vertebral plexus at this level which appears accentuated compared to the prior exam. Prominent degenerative endplate disease at LZ8-0and L4-5 with endplate irregularity and sclerosis, and grade 1 retrolisthesis at L4-5, similar to the prior  exam. There is believed to be impingement at both of these levels and also at L5-S1 primarily from spondylosis and degenerative disc disease. IMPRESSION: 1. Generally improved appearance, with significant reduction in size of the liver metastatic lesions, gastrohepatic ligament adenopathy, and mediastinal lymph nodes. 2. There several new areas of concern  from the bony standpoint. First, there is new irregular lucent lesion in the right side of the T1 vertebral body which could conceivably be a new large Schmorl's node but raises concern for the possibility of a metastatic lesion. There is a sclerotic lesion in the top of the L3 vertebral body which is new and once again could be reactive findings from a Schmorl 's node, but being new does raise some concern for possible early metastatic disease. Finally, there is a new fracture of the right first rib. Normally takes a fairly significant amount of trauma to fracture first rib, and there is some surrounding sclerosis in the rib which may be reactive but could be from a pathologic fracture. Accordingly, I do have some concern for the possibility of new bony metastatic disease despite the improvement in the soft tissue lesions. 3. Other imaging findings of potential clinical significance: Small pericardial effusion. Coronary, aortic arch, and branch vessel atherosclerotic vascular disease. Old healed rib fracture deformities. Subtle perirectal stranding/hypervascularity, probably incidental, less likely due to a distal colitis. Lumbar spondylosis and degenerative disc disease causing impingement at L3-4, L4-5, and L5-S1. Electronically Signed   By: Van Clines M.D.   On: 02/03/2016 09:26   Ct Abdomen Pelvis W Contrast  02/03/2016  CLINICAL DATA:  Metastatic esophageal cancer.  Ongoing chemotherapy. EXAM: CT CHEST, ABDOMEN, AND PELVIS WITH CONTRAST TECHNIQUE: Multidetector CT imaging of the chest, abdomen and pelvis was performed following the standard protocol  during bolus administration of intravenous contrast. CONTRAST:  1100m ISOVUE-300 IOPAMIDOL (ISOVUE-300) INJECTION 61% COMPARISON:  Multiple exams, including 11/17/2015 FINDINGS: CT CHEST FINDINGS Mediastinum/Nodes: Small pericardial effusion. Subcarinal lymph node 0.7 cm in short axis on image 31/2, previously 0.6 cm. Right supraclavicular node 0.6 cm in short axis on image 6/2, formerly 0.9 cm. Coronary, aortic arch, and branch vessel atherosclerotic vascular disease. There is some equivocal upper esophageal wall thickening and potentially some distal esophageal wall thickening at the gastroesophageal junction. Lungs/Pleura: No significant lung nodules.  The lungs appear clear. Musculoskeletal: Old left rib deformities from prior fractures. Old right anterior fourth and fifth rib deformities likely related to old fractures. New 1.8 by 1.0 cm lucent/lytic lesion along the right posterior margin of the T1 vertebral body, image 9/4. Acute fracture of the right first rib, image 12/4. CT ABDOMEN PELVIS FINDINGS Hepatobiliary: In general there is a significant reduction in the overall size of the hepatic tumors. The lesion in the dome of segment 4a previously measured 3.9 by 3.4 cm and currently measures 1.5 by 1.5 cm. The anterior segment 4 lesion previously measuring 5.0 by 3.7 cm currently measures 2.4 by 1.9 cm on image 58/2. Pancreas: Unremarkable Spleen: 2.1 by 1.8 cm fluid density lesion, image 55 series 2, stable. Adrenals/Urinary Tract: 7 mm hypodense lesion in the left mid kidney, image 24/5, stable, likely a cyst. Adrenal glands normal. Stomach/Bowel: Unremarkable Vascular/Lymphatic: Adenopathy/ mass in the gastrohepatic ligament 2.8 by 1.6 cm on image 59/2, formerly 2.6 by 5.2 cm. Aortoiliac atherosclerotic vascular disease. Reproductive: Unremarkable Other: Subtle perirectal stranding/hypervascularity, questionable significance. Musculoskeletal: 1.3 by 0.8 cm sclerotic focus along the superior endplate of L3  on image 82/602, new compared to the prior exam, possibly reactive findings from a Schmorl 's node but a bony metastatic lesion is difficult to exclude. There is some lucency around the base of vertebral plexus at this level which appears accentuated compared to the prior exam. Prominent degenerative endplate disease at LJ1-9and L4-5 with endplate irregularity and sclerosis, and grade  1 retrolisthesis at L4-5, similar to the prior exam. There is believed to be impingement at both of these levels and also at L5-S1 primarily from spondylosis and degenerative disc disease. IMPRESSION: 1. Generally improved appearance, with significant reduction in size of the liver metastatic lesions, gastrohepatic ligament adenopathy, and mediastinal lymph nodes. 2. There several new areas of concern from the bony standpoint. First, there is new irregular lucent lesion in the right side of the T1 vertebral body which could conceivably be a new large Schmorl's node but raises concern for the possibility of a metastatic lesion. There is a sclerotic lesion in the top of the L3 vertebral body which is new and once again could be reactive findings from a Schmorl 's node, but being new does raise some concern for possible early metastatic disease. Finally, there is a new fracture of the right first rib. Normally takes a fairly significant amount of trauma to fracture first rib, and there is some surrounding sclerosis in the rib which may be reactive but could be from a pathologic fracture. Accordingly, I do have some concern for the possibility of new bony metastatic disease despite the improvement in the soft tissue lesions. 3. Other imaging findings of potential clinical significance: Small pericardial effusion. Coronary, aortic arch, and branch vessel atherosclerotic vascular disease. Old healed rib fracture deformities. Subtle perirectal stranding/hypervascularity, probably incidental, less likely due to a distal colitis. Lumbar  spondylosis and degenerative disc disease causing impingement at L3-4, L4-5, and L5-S1. Electronically Signed   By: Van Clines M.D.   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 S/p palliative radiation therapy to the esophagus and regional lymph nodes. He has tolerated treatment generally well except for some mild grade 2 radiation esophagitis which has now resolved. Repeat CT chest abdomen pelvis done 11/17/2015 show progression of his liver metastases and gastrohepatic lymph node as expected.  He has completed his 3 cycles of carboplatin and Taxol  He is here for follow-up prior to his 4th cycle. He notes no prohibitive toxicities at this time. He has no symptoms suggestive of disease progression at this time. CT chest abdomen pelvis (02/03/2016) shows significant improvement in his liver lesions and gastrohepatic ligament adenopathy and mediastinal lymph nodes. Some bone changes concerning for possible early bony metastases versus reactive changes from schmorl nodes  2) HTN- patient notes his appetite has improved and he is eating better. On amlodipine. 3) Dysphagia due to esophageal SCC -mostly resolved 4) grade 2 radiation esophagitis. Resolved . 5) leukocytosis likely due to Neulasta. No evidence of infection at this time. 6) grade 1 fatigue  Plan --Discuss lab findings and imaging study findings in detail with the patient. -- patient is appropriate to proceed with his 4th cycle of carboplatin plus Taxol at current doses and with current medications and G-CSF support. -We'll start the patient on Xgeva given concern for possible bony metastases. -We will plan to complete 6 cycles of carboplatin and Taxol and then repeat CT chest abdomen pelvis and bone scan -  recommended continued optimization of his nutrition and maintaining good hydration.  All of his questions and concerns were answered in detail.   return to care with Dr. Irene Limbo in 3 weeks with repeat labs   I spent 20 minutes counseling  the patient face to face. The total time spent in the appointment was 25 minutes and more than 50% was on counseling and direct patient cares.    Sullivan Lone MD [place] AAHIVMS [facility] Hematology/Oncology Physician [facility]  (Office):       [phone] (Work cell):  [phone] (Fax):           [phone]

## 2016-02-17 ENCOUNTER — Other Ambulatory Visit: Payer: Self-pay | Admitting: Hematology

## 2016-02-18 ENCOUNTER — Other Ambulatory Visit: Payer: Self-pay | Admitting: *Deleted

## 2016-02-18 DIAGNOSIS — C159 Malignant neoplasm of esophagus, unspecified: Secondary | ICD-10-CM

## 2016-02-18 DIAGNOSIS — C787 Secondary malignant neoplasm of liver and intrahepatic bile duct: Secondary | ICD-10-CM

## 2016-02-18 MED ORDER — PROCHLORPERAZINE MALEATE 10 MG PO TABS: 10.0000 mg | ORAL_TABLET | Freq: Four times a day (QID) | ORAL | Status: DC | PRN

## 2016-02-18 MED ORDER — DEXAMETHASONE 4 MG PO TABS: 8.0000 mg | ORAL_TABLET | Freq: Every day | ORAL | Status: DC

## 2016-02-18 MED ORDER — ONDANSETRON HCL 8 MG PO TABS: 8.0000 mg | ORAL_TABLET | Freq: Three times a day (TID) | ORAL | Status: DC | PRN

## 2016-02-18 MED ORDER — LORAZEPAM 0.5 MG PO TABS: 0.5000 mg | ORAL_TABLET | Freq: Four times a day (QID) | ORAL | Status: DC | PRN

## 2016-02-18 MED FILL — DEXAMETHASONE 4 MG TABLET: 4 | 15 days supply | Qty: 30 | Fill #0

## 2016-02-18 MED FILL — ONDANSETRON HCL 8 MG TABLET: 8 | 10 days supply | Qty: 30 | Fill #0

## 2016-02-18 MED FILL — PROCHLORPERAZINE 10 MG TAB: 10 | 7 days supply | Qty: 30 | Fill #0

## 2016-02-18 MED FILL — LORazepam 0.5 MG TABS: 0.5 | 7 days supply | Qty: 30 | Fill #0

## 2016-02-29 ENCOUNTER — Ambulatory Visit (HOSPITAL_BASED_OUTPATIENT_CLINIC_OR_DEPARTMENT_OTHER): Payer: 59 | Admitting: Hematology

## 2016-02-29 ENCOUNTER — Telehealth: Payer: Self-pay | Admitting: Hematology

## 2016-02-29 ENCOUNTER — Ambulatory Visit: Payer: 59

## 2016-02-29 ENCOUNTER — Ambulatory Visit (HOSPITAL_BASED_OUTPATIENT_CLINIC_OR_DEPARTMENT_OTHER): Payer: 59

## 2016-02-29 ENCOUNTER — Encounter: Payer: Self-pay | Admitting: Hematology

## 2016-02-29 ENCOUNTER — Other Ambulatory Visit (HOSPITAL_BASED_OUTPATIENT_CLINIC_OR_DEPARTMENT_OTHER): Payer: 59

## 2016-02-29 ENCOUNTER — Other Ambulatory Visit: Payer: Self-pay | Admitting: *Deleted

## 2016-02-29 VITALS — BP 120/75 | HR 60 | Temp 98.7°F | Resp 18 | Ht 69.0 in | Wt 132.7 lb

## 2016-02-29 DIAGNOSIS — Z5111 Encounter for antineoplastic chemotherapy: Secondary | ICD-10-CM

## 2016-02-29 DIAGNOSIS — C159 Malignant neoplasm of esophagus, unspecified: Secondary | ICD-10-CM

## 2016-02-29 DIAGNOSIS — C154 Malignant neoplasm of middle third of esophagus: Secondary | ICD-10-CM

## 2016-02-29 DIAGNOSIS — C787 Secondary malignant neoplasm of liver and intrahepatic bile duct: Secondary | ICD-10-CM | POA: Diagnosis not present

## 2016-02-29 DIAGNOSIS — C7951 Secondary malignant neoplasm of bone: Secondary | ICD-10-CM

## 2016-02-29 DIAGNOSIS — Z95828 Presence of other vascular implants and grafts: Secondary | ICD-10-CM

## 2016-02-29 DIAGNOSIS — C155 Malignant neoplasm of lower third of esophagus: Secondary | ICD-10-CM

## 2016-02-29 LAB — CBC & DIFF AND RETIC
BASO%: 0.1 % (ref 0.0–2.0)
Basophils Absolute: 0 10*3/uL (ref 0.0–0.1)
EOS%: 0 % (ref 0.0–7.0)
Eosinophils Absolute: 0 10*3/uL (ref 0.0–0.5)
HEMATOCRIT: 34.6 % — AB (ref 38.4–49.9)
HEMOGLOBIN: 11.4 g/dL — AB (ref 13.0–17.1)
Immature Retic Fract: 17.4 % — ABNORMAL HIGH (ref 3.00–10.60)
LYMPH#: 0.7 10*3/uL — AB (ref 0.9–3.3)
LYMPH%: 5.2 % — ABNORMAL LOW (ref 14.0–49.0)
MCH: 33.9 pg — AB (ref 27.2–33.4)
MCHC: 32.9 g/dL (ref 32.0–36.0)
MCV: 103 fL — AB (ref 79.3–98.0)
MONO#: 0.4 10*3/uL (ref 0.1–0.9)
MONO%: 3.2 % (ref 0.0–14.0)
NEUT%: 91.5 % — ABNORMAL HIGH (ref 39.0–75.0)
NEUTROS ABS: 12.6 10*3/uL — AB (ref 1.5–6.5)
PLATELETS: 310 10*3/uL (ref 140–400)
RBC: 3.35 10*6/uL — ABNORMAL LOW (ref 4.20–5.82)
RDW: 23.3 % — ABNORMAL HIGH (ref 11.0–14.6)
RETIC CT ABS: 82.75 10*3/uL (ref 34.80–93.90)
Retic %: 2.47 % — ABNORMAL HIGH (ref 0.80–1.80)
WBC: 13.8 10*3/uL — ABNORMAL HIGH (ref 4.0–10.3)

## 2016-02-29 LAB — COMPREHENSIVE METABOLIC PANEL
ALBUMIN: 3.9 g/dL (ref 3.5–5.0)
ALK PHOS: 124 U/L (ref 40–150)
ALT: 21 U/L (ref 0–55)
ANION GAP: 10 meq/L (ref 3–11)
AST: 15 U/L (ref 5–34)
BUN: 17.4 mg/dL (ref 7.0–26.0)
CALCIUM: 9.8 mg/dL (ref 8.4–10.4)
CO2: 29 mEq/L (ref 22–29)
Chloride: 98 mEq/L (ref 98–109)
Creatinine: 0.8 mg/dL (ref 0.7–1.3)
EGFR: >90 mL/min/{1.73_m2} (ref >90)
Glucose: 115 mg/dl (ref 70–140)
POTASSIUM: 4 meq/L (ref 3.5–5.1)
Sodium: 137 mEq/L (ref 136–145)
Total Bilirubin: <0.3 mg/dL (ref 0.20–1.20)
Total Protein: 7.2 g/dL (ref 6.4–8.3)

## 2016-02-29 MED ORDER — DIPHENHYDRAMINE HCL 50 MG/ML IJ SOLN
50.0000 mg | Freq: Once | INTRAMUSCULAR | Status: AC
  Administered 2016-02-29: 50 mg via INTRAVENOUS

## 2016-02-29 MED ORDER — SODIUM CHLORIDE 0.9 % IV SOLN
175.0000 mg/m2 | Freq: Once | INTRAVENOUS | Status: AC
  Administered 2016-02-29: 294 mg via INTRAVENOUS
  Filled 2016-02-29: qty 49

## 2016-02-29 MED ORDER — HEPARIN SOD (PORK) LOCK FLUSH 100 UNIT/ML IV SOLN
500.0000 [IU] | Freq: Once | INTRAVENOUS | Status: DC | PRN
  Filled 2016-02-29: qty 5

## 2016-02-29 MED ORDER — SODIUM CHLORIDE 0.9 % IJ SOLN
10.0000 mL | INTRAMUSCULAR | Status: DC | PRN
  Administered 2016-02-29: 10 mL via INTRAVENOUS
  Filled 2016-02-29: qty 10

## 2016-02-29 MED ORDER — PALONOSETRON HCL INJECTION 0.25 MG/5ML
0.2500 mg | Freq: Once | INTRAVENOUS | Status: AC
  Administered 2016-02-29: 0.25 mg via INTRAVENOUS

## 2016-02-29 MED ORDER — SODIUM CHLORIDE 0.9% FLUSH
10.0000 mL | INTRAVENOUS | Status: DC | PRN
  Filled 2016-02-29: qty 10

## 2016-02-29 MED ORDER — FAMOTIDINE IN NACL 20-0.9 MG/50ML-% IV SOLN
20.0000 mg | Freq: Once | INTRAVENOUS | Status: AC
Start: 2016-02-29 — End: 2016-02-29
  Administered 2016-02-29: 20 mg via INTRAVENOUS

## 2016-02-29 MED ORDER — SODIUM CHLORIDE 0.9 % IV SOLN
20.0000 mg | Freq: Once | INTRAVENOUS | Status: AC
  Administered 2016-02-29: 20 mg via INTRAVENOUS
  Filled 2016-02-29: qty 2

## 2016-02-29 MED ORDER — SODIUM CHLORIDE 0.9 % IV SOLN
Freq: Once | INTRAVENOUS | Status: AC
  Administered 2016-02-29: 11:00:00 via INTRAVENOUS

## 2016-02-29 MED ORDER — PALONOSETRON HCL INJECTION 0.25 MG/5ML
INTRAVENOUS | Status: AC
  Filled 2016-02-29: qty 5

## 2016-02-29 MED ORDER — FAMOTIDINE IN NACL 20-0.9 MG/50ML-% IV SOLN
INTRAVENOUS | Status: AC
  Filled 2016-02-29: qty 50

## 2016-02-29 MED ORDER — SODIUM CHLORIDE 0.9 % IV SOLN
540.0000 mg | Freq: Once | INTRAVENOUS | Status: AC
  Administered 2016-02-29: 540 mg via INTRAVENOUS
  Filled 2016-02-29: qty 54

## 2016-02-29 MED ORDER — DIPHENHYDRAMINE HCL 50 MG/ML IJ SOLN
INTRAMUSCULAR | Status: AC
  Filled 2016-02-29: qty 1

## 2016-02-29 MED ORDER — SODIUM CHLORIDE 0.9 % IV SOLN
INTRAVENOUS | Status: AC
  Administered 2016-02-29: 11:00:00 via INTRAVENOUS

## 2016-02-29 NOTE — Patient Instructions (Signed)
[facility] Discharge Instructions for Patients Receiving Chemotherapy  Today you received the following chemotherapy agents:  Taxol and Carboplatin.  To help prevent nausea and vomiting after your treatment, we encourage you to take your nausea medication as prescribed.   If you develop nausea and vomiting that is not controlled by your nausea medication, call the clinic.   BELOW ARE SYMPTOMS THAT SHOULD BE REPORTED IMMEDIATELY:  *FEVER GREATER THAN 100.5 F  *CHILLS WITH OR WITHOUT FEVER  NAUSEA AND VOMITING THAT IS NOT CONTROLLED WITH YOUR NAUSEA MEDICATION  *UNUSUAL SHORTNESS OF BREATH  *UNUSUAL BRUISING OR BLEEDING  TENDERNESS IN MOUTH AND THROAT WITH OR WITHOUT PRESENCE OF ULCERS  *URINARY PROBLEMS  *BOWEL PROBLEMS  UNUSUAL RASH Items with * indicate a potential emergency and should be followed up as soon as possible.  Feel free to call the clinic you have any questions or concerns. The clinic phone number is [phone].  Please show the [place] at check-in to the Emergency Department and triage nurse.  Denosumab injection What is this medicine? DENOSUMAB (den oh sue mab) slows bone breakdown. Prolia is used to treat osteoporosis in women after menopause and in men. Delton See is used to prevent bone fractures and other bone problems caused by cancer bone metastases. Delton See is also used to treat giant cell tumor of the bone. This medicine may be used for other purposes; ask your health care provider or pharmacist if you have questions. What should I tell my health care provider before I take this medicine? They need to know if you have any of these conditions: -dental disease -eczema -infection or history of infections -kidney disease or on dialysis -low blood calcium or vitamin D -malabsorption syndrome -scheduled to have surgery or tooth extraction -taking medicine that contains denosumab -thyroid or parathyroid disease -an unusual reaction  to denosumab, other medicines, foods, dyes, or preservatives -pregnant or trying to get pregnant -breast-feeding How should I use this medicine? This medicine is for injection under the skin. It is given by a health care professional in a hospital or clinic setting. If you are getting Prolia, a special MedGuide will be given to you by the pharmacist with each prescription and refill. Be sure to read this information carefully each time. For Prolia, talk to your pediatrician regarding the use of this medicine in children. Special care may be needed. For Delton See, talk to your pediatrician regarding the use of this medicine in children. While this drug may be prescribed for children as young as 13 years for selected conditions, precautions do apply. Overdosage: If you think you have taken too much of this medicine contact a poison control center or emergency room at once. NOTE: This medicine is only for you. Do not share this medicine with others. What if I miss a dose? It is important not to miss your dose. Call your doctor or health care professional if you are unable to keep an appointment. What may interact with this medicine? Do not take this medicine with any of the following medications: -other medicines containing denosumab This medicine may also interact with the following medications: -medicines that suppress the immune system -medicines that treat cancer -steroid medicines like prednisone or cortisone This list may not describe all possible interactions. Give your health care provider a list of all the medicines, herbs, non-prescription drugs, or dietary supplements you use. Also tell them if you smoke, drink alcohol, or use illegal drugs. Some items may interact with your medicine.  What should I watch for while using this medicine? Visit your doctor or health care professional for regular checks on your progress. Your doctor or health care professional may order blood tests and other tests  to see how you are doing. Call your doctor or health care professional if you get a cold or other infection while receiving this medicine. Do not treat yourself. This medicine may decrease your body's ability to fight infection. You should make sure you get enough calcium and vitamin D while you are taking this medicine, unless your doctor tells you not to. Discuss the foods you eat and the vitamins you take with your health care professional. See your dentist regularly. Brush and floss your teeth as directed. Before you have any dental work done, tell your dentist you are receiving this medicine. Do not become pregnant while taking this medicine or for 5 months after stopping it. Women should inform their doctor if they wish to become pregnant or think they might be pregnant. There is a potential for serious side effects to an unborn child. Talk to your health care professional or pharmacist for more information. What side effects may I notice from receiving this medicine? Side effects that you should report to your doctor or health care professional as soon as possible: -allergic reactions like skin rash, itching or hives, swelling of the face, lips, or tongue -breathing problems -chest pain -fast, irregular heartbeat -feeling faint or lightheaded, falls -fever, chills, or any other sign of infection -muscle spasms, tightening, or twitches -numbness or tingling -skin blisters or bumps, or is dry, peels, or red -slow healing or unexplained pain in the mouth or jaw -unusual bleeding or bruising Side effects that usually do not require medical attention (Report these to your doctor or health care professional if they continue or are bothersome.): -muscle pain -stomach upset, gas This list may not describe all possible side effects. Call your doctor for medical advice about side effects. You may report side effects to FDA at 1-800-FDA-1088. Where should I keep my medicine? This medicine is only  given in a clinic, doctor's office, or other health care setting and will not be stored at home. NOTE: This sheet is a summary. It may not cover all possible information. If you have questions about this medicine, talk to your doctor, pharmacist, or health care provider.    2016, [employer]. (2012-02-19 12:37:47)

## 2016-02-29 NOTE — Telephone Encounter (Signed)
per pof to sch pt appt-gave pt copy of avs °

## 2016-02-29 NOTE — Progress Notes (Signed)
[name] Kitchen    HEMATOLOGY/ONCOLOGY CLINIC NOTE  Date of Service: 02/29/2016   Patient Care Team: Leanna Battles, MD as PCP - General (Internal Medicine)  CHIEF COMPLAINTS/PURPOSE OF CONSULTATION:  Newly diagnosed Esophageal Squamous cell carcinoma   HISTORY OF PRESENTING ILLNESS: plz see my initial consultation regarding patients initial presentation  INTERVAL HISTORY  Mr Molner is here for follow-up  Prior to cycle 5 of his planned carboplatin Taxol palliative chemotherapy for metastatic esophageal carcinoma. He notes no nausea no vomiting. No notable dysphagia at this time.   No fevers no chills no night sweats. Breathing is stable. 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 Note some mild weakness for a couple of hours after the Xgeva. He was recommended to continue taking his oral calcium. No other acute new focal symptoms. Still continues to work part-time. Grade 1 fatigue from chemotherapy.   MEDICAL HISTORY:  Past Medical History  Diagnosis Date  . Hypertension   . Arthritis   . Knee pain   . Esophageal cancer [facility])     SURGICAL HISTORY: Past Surgical History  Procedure Laterality Date  . Knee surgery    . Lesion removal  1969    hip  . Esophagogastroduodenoscopy (egd) with esophageal dilation      and biopsy    SOCIAL HISTORY: Social History   Social History  . Marital Status: Single    Spouse Name: N/A  . Number of Children: 2  . Years of Education: N/A   Occupational History  . Manufacturing industrial curtains    Social History Main Topics  . Smoking status: Current Every Day Smoker -- 0.25 packs/day for 20 years    Types: Cigarettes  . Smokeless tobacco: Never Used  . Alcohol Use: No  . Drug Use: Yes    Special: Marijuana     Comment: last smoked 1 week ago  . Sexual Activity: Yes   Other Topics Concern  . 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 Sober for 15 yrs  Ex smoker 1/2 PPD x 66yr quit about 18 yrs ago.  FAMILY HISTORY: Family History  Problem Relation Age of Onset  . Colon cancer Neg Hx   . Esophageal cancer Father   . Cancer Father     nasopharyngeal   . Diabetes Sister   . Cancer Sister     multiple myeloma    ALLERGIES:  has No Known Allergies.  MEDICATIONS:  Current Outpatient Prescriptions  Medication Sig Dispense Refill  . amLODipine (NORVASC) 5 MG tablet Take 5 mg by mouth daily.    .[name] Kitchendexamethasone (DECADRON) 4 MG tablet Take 2 tablets (8 mg total) by mouth daily. Start the day after chemotherapy for 4 days. 30 tablet 1  . hydrocortisone (CORTEF) 20 MG tablet     . lidocaine-prilocaine (EMLA) cream Apply 1 application topically once. 30 g 1  . lisinopril-hydrochlorothiazide (PRINZIDE,ZESTORETIC) 10-12.5 MG tablet     . LORazepam (ATIVAN) 0.5 MG tablet Take 1 tablet (0.5 mg total) by mouth every 6 (six) hours as needed (Nausea or vomiting). 30 tablet 0  . meloxicam (MOBIC) 15 MG tablet Take 15 mg by mouth daily.    .[name] Kitchen  omeprazole (PRILOSEC) 20 MG capsule TAKE ONE CAPSULE BY MOUTH TWICE DAILY BEFORE A MEAL 60 capsule 0  . ondansetron (ZOFRAN) 8 MG tablet Take 1 tablet (8 mg total) by mouth every 8 (eight) hours as needed for nausea. 30 tablet 3  . oxyCODONE (ROXICODONE) 5 MG/5ML solution Take 5-10 mLs (5-10 mg total) by mouth every 4 (four) hours as needed for moderate pain or severe pain. 200 mL 0  . prochlorperazine (COMPAZINE) 10 MG tablet Take 1 tablet (10 mg total) by mouth every 6 (six) hours as needed (Nausea or vomiting). 30 tablet 1  . sucralfate (CARAFATE) 1 GM/10ML suspension Take 10 mLs (1 g total) by mouth 4 (four) times daily -  with meals and at bedtime. 420 mL 0  . VIAGRA 100 MG tablet Reported on 10/22/2015     No  current facility-administered medications for this visit.   Facility-Administered Medications Ordered in Other Visits  Medication Dose Route Frequency Provider Last Rate Last Dose  . 0.9 %  sodium chloride infusion   Intravenous Continuous Brunetta Genera, MD 500 mL/hr at 02/29/16 1058    . CARBOplatin (PARAPLATIN) 540 mg in sodium chloride 0.9 % 250 mL chemo infusion  540 mg Intravenous Once Brunetta Genera, MD      . heparin lock flush 100 unit/mL  500 Units Intracatheter Once PRN Brunetta Genera, MD      . PACLitaxel (TAXOL) 294 mg in sodium chloride 0.9 % 250 mL chemo infusion (> 27m/m2)  175 mg/m2 (Treatment Plan Actual) Intravenous Once GBrunetta Genera MD      . sodium chloride flush (NS) 0.9 % injection 10 mL  10 mL Intracatheter PRN [place] MD        REVIEW OF SYSTEMS:    10 Point review of Systems was done is negative except as noted above.  PHYSICAL EXAMINATION: ECOG PERFORMANCE STATUS: 1 - Symptomatic but completely ambulatory  . Filed Vitals:   02/29/16 0937  BP: 120/75  Pulse: 60  Temp: 98.7 F (37.1 C)  Resp: 18   Filed Weights   02/29/16 0937  Weight: 132 lb 11.2 oz (60.192 kg)   .Body mass index is 19.59 kg/(m^2).  .[name] KitchenWt Readings from Last 3 Encounters:  02/29/16 132 lb 11.2 oz (60.192 kg)  02/08/16 131 lb 3.2 oz (59.512 kg)  01/18/16 126 lb (57.153 kg)   GENERAL:middle aged AAM ,alert, in no acute distress and comfortable SKIN: skin color, texture, turgor are normal, no rashes or significant lesions EYES: normal, conjunctiva are pink and non-injected, sclera clear OROPHARYNX:no exudate, no erythema and lips, buccal mucosa, and tongue normal  NECK: supple, no JVD, thyroid normal size, non-tender, without nodularity LYMPH:  no palpable lymphadenopathy in the cervical, axillary or inguinal LUNGS: clear to auscultation with normal respiratory effort HEART: regular rate & rhythm,  no murmurs and no lower extremity edema ABDOMEN:  abdomen soft, non-tender, normoactive bowel sounds  Musculoskeletal: no cyanosis of digits and no clubbing  PSYCH: alert & oriented x 3 with fluent speech NEURO: no focal motor/sensory deficits  LABORATORY DATA:  I have reviewed the data as listed  . CBC Latest Ref Rng 02/29/2016 02/08/2016 01/18/2016  WBC 4.0 - 10.3 10e3/uL 13.8(H) 16.0(H) 10.0  Hemoglobin 13.0 - 17.1 g/dL 11.4(L) 10.9(L) 10.8(L)  Hematocrit 38.4 - 49.9 % 34.6(L) 32.6(L) 33.0(L)  Platelets 140 - 400 10e3/uL 310 363 368   . CBC    Component Value Date/Time   WBC 13.8* 02/29/2016 0850  WBC 5.4 12/08/2015 1149   RBC 3.35* 02/29/2016 0850   RBC 3.84* 12/08/2015 1149   HGB 11.4* 02/29/2016 0850   HGB 11.5* 12/08/2015 1149   HCT 34.6* 02/29/2016 0850   HCT 33.8* 12/08/2015 1149   PLT 310 02/29/2016 0850   PLT 428* 12/08/2015 1149   MCV 103.0* 02/29/2016 0850   MCV 88.0 12/08/2015 1149   MCH 33.9* 02/29/2016 0850   MCH 29.9 12/08/2015 1149   MCHC 32.9 02/29/2016 0850   MCHC 34.0 12/08/2015 1149   RDW 23.3* 02/29/2016 0850   RDW 15.7* 12/08/2015 1149   LYMPHSABS 0.7* 02/29/2016 0850   LYMPHSABS 1.3 12/08/2015 1149   MONOABS 0.4 02/29/2016 0850   MONOABS 0.5 12/08/2015 1149   EOSABS 0.0 02/29/2016 0850   EOSABS 0.1 12/08/2015 1149   BASOSABS 0.0 02/29/2016 0850   BASOSABS 0.0 12/08/2015 1149    CMP Latest Ref Rng 02/29/2016 02/08/2016 01/18/2016  Glucose 70 - 140 mg/dl 115 119 111  BUN 7.0 - 26.0 mg/dL 17.4 13.9 4.6(L)  Creatinine 0.7 - 1.3 mg/dL 0.8 0.8 0.7  Sodium 136 - 145 mEq/L 137 137 139  Potassium 3.5 - 5.1 mEq/L 4.0 3.7 3.6  CO2 22 - 29 mEq/L 29 28 30(H)  Calcium 8.4 - 10.4 mg/dL 9.8 9.5 9.9  Total Protein 6.4 - 8.3 g/dL 7.2 7.2 7.6  Total Bilirubin 0.20 - 1.20 mg/dL <0.30 0.38 0.30  Alkaline Phos 40 - 150 U/L 124 113 107  AST 5 - 34 U/L _0 ALT 0 - 55 U/L _1 RADIOGRAPHIC STUDIES: I have personally reviewed the radiological images as listed and agreed with the findings in the  report. Ct Chest W Contrast  02/03/2016  CLINICAL DATA:  Metastatic esophageal cancer.  Ongoing chemotherapy. EXAM: CT CHEST, ABDOMEN, AND PELVIS WITH CONTRAST TECHNIQUE: Multidetector CT imaging of the chest, abdomen and pelvis was performed following the standard protocol during bolus administration of intravenous contrast. CONTRAST:  15m ISOVUE-300 IOPAMIDOL (ISOVUE-300) INJECTION 61% COMPARISON:  Multiple exams, including 11/17/2015 FINDINGS: CT CHEST FINDINGS Mediastinum/Nodes: Small pericardial effusion. Subcarinal lymph node 0.7 cm in short axis on image 31/2, previously 0.6 cm. Right supraclavicular node 0.6 cm in short axis on image 6/2, formerly 0.9 cm. Coronary, aortic arch, and branch vessel atherosclerotic vascular disease. There is some equivocal upper esophageal wall thickening and potentially some distal esophageal wall thickening at the gastroesophageal junction. Lungs/Pleura: No significant lung nodules.  The lungs appear clear. Musculoskeletal: Old left rib deformities from prior fractures. Old right anterior fourth and fifth rib deformities likely related to old fractures. New 1.8 by 1.0 cm lucent/lytic lesion along the right posterior margin of the T1 vertebral body, image 9/4. Acute fracture of the right first rib, image 12/4. CT ABDOMEN PELVIS FINDINGS Hepatobiliary: In general there is a significant reduction in the overall size of the hepatic tumors. The lesion in the dome of segment 4a previously measured 3.9 by 3.4 cm and currently measures 1.5 by 1.5 cm. The anterior segment 4 lesion previously measuring 5.0 by 3.7 cm currently measures 2.4 by 1.9 cm on image 58/2. Pancreas: Unremarkable Spleen: 2.1 by 1.8 cm fluid density lesion, image 55 series 2, stable. Adrenals/Urinary Tract: 7 mm hypodense lesion in the left mid kidney, image 24/5, stable, likely a cyst. Adrenal glands normal. Stomach/Bowel: Unremarkable Vascular/Lymphatic: Adenopathy/ mass in the gastrohepatic ligament 2.8 by  1.6 cm on image 59/2, formerly 2.6 by 5.2 cm. Aortoiliac atherosclerotic vascular disease. Reproductive: Unremarkable Other:  Subtle perirectal stranding/hypervascularity, questionable significance. Musculoskeletal: 1.3 by 0.8 cm sclerotic focus along the superior endplate of L3 on image 82/602, new compared to the prior exam, possibly reactive findings from a Schmorl 's node but a bony metastatic lesion is difficult to exclude. There is some lucency around the base of vertebral plexus at this level which appears accentuated compared to the prior exam. Prominent degenerative endplate disease at T0-6 and L4-5 with endplate irregularity and sclerosis, and grade 1 retrolisthesis at L4-5, similar to the prior exam. There is believed to be impingement at both of these levels and also at L5-S1 primarily from spondylosis and degenerative disc disease. IMPRESSION: 1. Generally improved appearance, with significant reduction in size of the liver metastatic lesions, gastrohepatic ligament adenopathy, and mediastinal lymph nodes. 2. There several new areas of concern from the bony standpoint. First, there is new irregular lucent lesion in the right side of the T1 vertebral body which could conceivably be a new large Schmorl's node but raises concern for the possibility of a metastatic lesion. There is a sclerotic lesion in the top of the L3 vertebral body which is new and once again could be reactive findings from a Schmorl 's node, but being new does raise some concern for possible early metastatic disease. Finally, there is a new fracture of the right first rib. Normally takes a fairly significant amount of trauma to fracture first rib, and there is some surrounding sclerosis in the rib which may be reactive but could be from a pathologic fracture. Accordingly, I do have some concern for the possibility of new bony metastatic disease despite the improvement in the soft tissue lesions. 3. Other imaging findings of potential  clinical significance: Small pericardial effusion. Coronary, aortic arch, and branch vessel atherosclerotic vascular disease. Old healed rib fracture deformities. Subtle perirectal stranding/hypervascularity, probably incidental, less likely due to a distal colitis. Lumbar spondylosis and degenerative disc disease causing impingement at L3-4, L4-5, and L5-S1. Electronically Signed   By: Van Clines M.D.   On: 02/03/2016 09:26   Ct Abdomen Pelvis W Contrast  02/03/2016  CLINICAL DATA:  Metastatic esophageal cancer.  Ongoing chemotherapy. EXAM: CT CHEST, ABDOMEN, AND PELVIS WITH CONTRAST TECHNIQUE: Multidetector CT imaging of the chest, abdomen and pelvis was performed following the standard protocol during bolus administration of intravenous contrast. CONTRAST:  134m ISOVUE-300 IOPAMIDOL (ISOVUE-300) INJECTION 61% COMPARISON:  Multiple exams, including 11/17/2015 FINDINGS: CT CHEST FINDINGS Mediastinum/Nodes: Small pericardial effusion. Subcarinal lymph node 0.7 cm in short axis on image 31/2, previously 0.6 cm. Right supraclavicular node 0.6 cm in short axis on image 6/2, formerly 0.9 cm. Coronary, aortic arch, and branch vessel atherosclerotic vascular disease. There is some equivocal upper esophageal wall thickening and potentially some distal esophageal wall thickening at the gastroesophageal junction. Lungs/Pleura: No significant lung nodules.  The lungs appear clear. Musculoskeletal: Old left rib deformities from prior fractures. Old right anterior fourth and fifth rib deformities likely related to old fractures. New 1.8 by 1.0 cm lucent/lytic lesion along the right posterior margin of the T1 vertebral body, image 9/4. Acute fracture of the right first rib, image 12/4. CT ABDOMEN PELVIS FINDINGS Hepatobiliary: In general there is a significant reduction in the overall size of the hepatic tumors. The lesion in the dome of segment 4a previously measured 3.9 by 3.4 cm and currently measures 1.5 by 1.5  cm. The anterior segment 4 lesion previously measuring 5.0 by 3.7 cm currently measures 2.4 by 1.9 cm on image 58/2. Pancreas: Unremarkable Spleen:  2.1 by 1.8 cm fluid density lesion, image 55 series 2, stable. Adrenals/Urinary Tract: 7 mm hypodense lesion in the left mid kidney, image 24/5, stable, likely a cyst. Adrenal glands normal. Stomach/Bowel: Unremarkable Vascular/Lymphatic: Adenopathy/ mass in the gastrohepatic ligament 2.8 by 1.6 cm on image 59/2, formerly 2.6 by 5.2 cm. Aortoiliac atherosclerotic vascular disease. Reproductive: Unremarkable Other: Subtle perirectal stranding/hypervascularity, questionable significance. Musculoskeletal: 1.3 by 0.8 cm sclerotic focus along the superior endplate of L3 on image 82/602, new compared to the prior exam, possibly reactive findings from a Schmorl 's node but a bony metastatic lesion is difficult to exclude. There is some lucency around the base of vertebral plexus at this level which appears accentuated compared to the prior exam. Prominent degenerative endplate disease at B9-3 and L4-5 with endplate irregularity and sclerosis, and grade 1 retrolisthesis at L4-5, similar to the prior exam. There is believed to be impingement at both of these levels and also at L5-S1 primarily from spondylosis and degenerative disc disease. IMPRESSION: 1. Generally improved appearance, with significant reduction in size of the liver metastatic lesions, gastrohepatic ligament adenopathy, and mediastinal lymph nodes. 2. There several new areas of concern from the bony standpoint. First, there is new irregular lucent lesion in the right side of the T1 vertebral body which could conceivably be a new large Schmorl's node but raises concern for the possibility of a metastatic lesion. There is a sclerotic lesion in the top of the L3 vertebral body which is new and once again could be reactive findings from a Schmorl 's node, but being new does raise some concern for possible early  metastatic disease. Finally, there is a new fracture of the right first rib. Normally takes a fairly significant amount of trauma to fracture first rib, and there is some surrounding sclerosis in the rib which may be reactive but could be from a pathologic fracture. Accordingly, I do have some concern for the possibility of new bony metastatic disease despite the improvement in the soft tissue lesions. 3. Other imaging findings of potential clinical significance: Small pericardial effusion. Coronary, aortic arch, and branch vessel atherosclerotic vascular disease. Old healed rib fracture deformities. Subtle perirectal stranding/hypervascularity, probably incidental, less likely due to a distal colitis. Lumbar spondylosis and degenerative disc disease causing impingement at L3-4, L4-5, and L5-S1. Electronically Signed   By: Van Clines M.D.   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 S/p palliative radiation therapy to the esophagus and regional lymph nodes. He has tolerated treatment generally well except for some mild grade 2 radiation esophagitis which has now resolved. Repeat CT chest abdomen pelvis done 11/17/2015 show progression of his liver metastases and gastrohepatic lymph node as expected.  He has completed his 3 cycles of carboplatin and Taxol  He is here for follow-up prior to his 4th cycle. He notes no prohibitive toxicities at this time. He has no symptoms suggestive of disease progression at this time. CT chest abdomen pelvis (02/03/2016) shows significant improvement in his liver lesions and gastrohepatic ligament adenopathy and mediastinal lymph nodes. Some bone changes concerning for possible early bony metastases versus reactive changes from schmorl nodes  2)  HTN- patient notes his appetite has improved and he is eating better. On amlodipine. 3) Dysphagia due to esophageal SCC -  mostly resolved 4) grade 2 radiation esophagitis. Resolved . 5) leukocytosis likely due to Neulasta. No evidence of infection at this time. 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 Continue  on Xgeva given concern for possible bony metastases. -Monitor for worsening neuropathy. -We will plan to complete 6 cycles of carboplatin and Taxol if tolerated  and then repeat CT chest abdomen pelvis and bone scan - recommended continued optimization of his nutrition and maintaining good hydration.  All of his questions and concerns were answered in detail.   return to care with Dr. Irene Limbo in 3 weeks with repeat labs   I spent 20 minutes counseling the patient face to face. The total time spent in the appointment was 25 minutes and more than 50% was on counseling and direct patient cares.    Sullivan Lone MD [place] AAHIVMS [facility] Hematology/Oncology Physician [facility]  (Office):       [phone] (Work cell):  [phone] (Fax):           [phone]

## 2016-02-29 NOTE — Patient Instructions (Signed)

## 2016-03-01 ENCOUNTER — Telehealth: Payer: Self-pay | Admitting: *Deleted

## 2016-03-01 NOTE — Telephone Encounter (Signed)
I have adjusted 7/18 appt

## 2016-03-02 ENCOUNTER — Ambulatory Visit (HOSPITAL_BASED_OUTPATIENT_CLINIC_OR_DEPARTMENT_OTHER): Payer: 59

## 2016-03-02 VITALS — BP 130/74 | HR 76 | Temp 98.3°F | Resp 20

## 2016-03-02 DIAGNOSIS — Z5189 Encounter for other specified aftercare: Secondary | ICD-10-CM

## 2016-03-02 DIAGNOSIS — C154 Malignant neoplasm of middle third of esophagus: Secondary | ICD-10-CM

## 2016-03-02 DIAGNOSIS — C787 Secondary malignant neoplasm of liver and intrahepatic bile duct: Secondary | ICD-10-CM

## 2016-03-02 DIAGNOSIS — C159 Malignant neoplasm of esophagus, unspecified: Secondary | ICD-10-CM

## 2016-03-02 MED ORDER — PEGFILGRASTIM INJECTION 6 MG/0.6ML ~~LOC~~
6.0000 mg | PREFILLED_SYRINGE | Freq: Once | SUBCUTANEOUS | Status: AC
Start: 2016-03-02 — End: 2016-03-02
  Administered 2016-03-02: 6 mg via SUBCUTANEOUS
  Filled 2016-03-02: qty 0.6

## 2016-03-02 NOTE — Patient Instructions (Signed)
Pegfilgrastim injection What is this medicine? PEGFILGRASTIM (PEG fil gra stim) is a long-acting granulocyte colony-stimulating factor that stimulates the growth of neutrophils, a type of white blood cell important in the body's fight against infection. It is used to reduce the incidence of fever and infection in patients with certain types of cancer who are receiving chemotherapy that affects the bone marrow, and to increase survival after being exposed to high doses of radiation. This medicine may be used for other purposes; ask your health care provider or pharmacist if you have questions. What should I tell my health care provider before I take this medicine? They need to know if you have any of these conditions: -kidney disease -latex allergy -ongoing radiation therapy -sickle cell disease -skin reactions to acrylic adhesives (On-Body Injector only) -an unusual or allergic reaction to pegfilgrastim, filgrastim, other medicines, foods, dyes, or preservatives -pregnant or trying to get pregnant -breast-feeding How should I use this medicine? This medicine is for injection under the skin. If you get this medicine at home, you will be taught how to prepare and give the pre-filled syringe or how to use the On-body Injector. Refer to the patient Instructions for Use for detailed instructions. Use exactly as directed. Take your medicine at regular intervals. Do not take your medicine more often than directed. It is important that you put your used needles and syringes in a special sharps container. Do not put them in a trash can. If you do not have a sharps container, call your pharmacist or healthcare provider to get one. Talk to your pediatrician regarding the use of this medicine in children. While this drug may be prescribed for selected conditions, precautions do apply. Overdosage: If you think you have taken too much of this medicine contact a poison control center or emergency room at  once. NOTE: This medicine is only for you. Do not share this medicine with others. What if I miss a dose? It is important not to miss your dose. Call your doctor or health care professional if you miss your dose. If you miss a dose due to an On-body Injector failure or leakage, a new dose should be administered as soon as possible using a single prefilled syringe for manual use. What may interact with this medicine? Interactions have not been studied. Give your health care provider a list of all the medicines, herbs, non-prescription drugs, or dietary supplements you use. Also tell them if you smoke, drink alcohol, or use illegal drugs. Some items may interact with your medicine. This list may not describe all possible interactions. Give your health care provider a list of all the medicines, herbs, non-prescription drugs, or dietary supplements you use. Also tell them if you smoke, drink alcohol, or use illegal drugs. Some items may interact with your medicine. What should I watch for while using this medicine? You may need blood work done while you are taking this medicine. If you are going to need a MRI, CT scan, or other procedure, tell your doctor that you are using this medicine (On-Body Injector only). What side effects may I notice from receiving this medicine? Side effects that you should report to your doctor or health care professional as soon as possible: -allergic reactions like skin rash, itching or hives, swelling of the face, lips, or tongue -dizziness -fever -pain, redness, or irritation at site where injected -pinpoint red spots on the skin -red or dark-brown urine -shortness of breath or breathing problems -stomach or side pain, or pain   at the shoulder -swelling -tiredness -trouble passing urine or change in the amount of urine Side effects that usually do not require medical attention (report to your doctor or health care professional if they continue or are  bothersome): -bone pain -muscle pain This list may not describe all possible side effects. Call your doctor for medical advice about side effects. You may report side effects to FDA at 1-800-FDA-1088. Where should I keep my medicine? Keep out of the reach of children. Store pre-filled syringes in a refrigerator between 2 and 8 degrees C (36 and 46 degrees F). Do not freeze. Keep in carton to protect from light. Throw away this medicine if it is left out of the refrigerator for more than 48 hours. Throw away any unused medicine after the expiration date. NOTE: This sheet is a summary. It may not cover all possible information. If you have questions about this medicine, talk to your doctor, pharmacist, or health care provider.    2016, Elsevier/Gold Standard. (2014-09-10 14:30:14)  

## 2016-03-09 ENCOUNTER — Telehealth: Payer: Self-pay | Admitting: *Deleted

## 2016-03-09 NOTE — Telephone Encounter (Signed)
Per triage note, pt called c/o numbness to left foot and facial swelling.  Reviewed call with Dr. Irene Limbo.  Per triage note, ok to leave voice message with patient.  This RN returned call, LVM to let patient know numbness in foot is due to neuropathy from taxol and facial swelling may be due to steroids.  Instructed patient to let us know if numbness in foot interferes with ADL/amulation.  Also instructed patient to call back if he feels worse/feels he needs to be evaluated prior to apt on 7/18.

## 2016-03-09 NOTE — Telephone Encounter (Signed)
Patient's mother called stating that the numbness/tingling in left foot is getting worse and has facial swelling that is not decreasing. RN can reach patient at [phone] or [phone]. Mother has given permission to leave voicemail if no one answers. Message sent to MD Kale/ RN Delle Reining.

## 2016-03-20 ENCOUNTER — Other Ambulatory Visit: Payer: Self-pay | Admitting: *Deleted

## 2016-03-20 ENCOUNTER — Other Ambulatory Visit: Payer: Self-pay | Admitting: Hematology

## 2016-03-20 ENCOUNTER — Telehealth: Payer: Self-pay

## 2016-03-20 DIAGNOSIS — C155 Malignant neoplasm of lower third of esophagus: Secondary | ICD-10-CM

## 2016-03-20 MED FILL — DEXAMETHASONE 4 MG TABLET: 4 | 15 days supply | Qty: 30 | Fill #1

## 2016-03-20 MED FILL — PROCHLORPERAZINE 10 MG TAB: 10 | 7 days supply | Qty: 30 | Fill #1

## 2016-03-20 MED FILL — ONDANSETRON HCL 8 MG TABLET: 8 | 10 days supply | Qty: 30 | Fill #1

## 2016-03-20 NOTE — Telephone Encounter (Signed)
Pt called requesting lorazepam refill to be called into [facility]. He asked for other Rx  but they all had refills on them - dexamethasone, zofran and compazine.

## 2016-03-21 ENCOUNTER — Encounter: Payer: Self-pay | Admitting: *Deleted

## 2016-03-21 ENCOUNTER — Ambulatory Visit (HOSPITAL_BASED_OUTPATIENT_CLINIC_OR_DEPARTMENT_OTHER): Payer: 59 | Admitting: Hematology

## 2016-03-21 ENCOUNTER — Other Ambulatory Visit (HOSPITAL_BASED_OUTPATIENT_CLINIC_OR_DEPARTMENT_OTHER): Payer: 59

## 2016-03-21 ENCOUNTER — Ambulatory Visit (HOSPITAL_BASED_OUTPATIENT_CLINIC_OR_DEPARTMENT_OTHER): Payer: 59

## 2016-03-21 ENCOUNTER — Ambulatory Visit: Payer: 59 | Admitting: Nutrition

## 2016-03-21 ENCOUNTER — Encounter: Payer: Self-pay | Admitting: Hematology

## 2016-03-21 ENCOUNTER — Telehealth: Payer: Self-pay | Admitting: Hematology

## 2016-03-21 ENCOUNTER — Ambulatory Visit: Payer: 59

## 2016-03-21 VITALS — BP 130/74 | HR 78 | Temp 98.6°F | Resp 18 | Ht 69.0 in | Wt 133.5 lb

## 2016-03-21 DIAGNOSIS — C787 Secondary malignant neoplasm of liver and intrahepatic bile duct: Secondary | ICD-10-CM | POA: Diagnosis not present

## 2016-03-21 DIAGNOSIS — C7951 Secondary malignant neoplasm of bone: Secondary | ICD-10-CM

## 2016-03-21 DIAGNOSIS — C155 Malignant neoplasm of lower third of esophagus: Secondary | ICD-10-CM

## 2016-03-21 DIAGNOSIS — Z95828 Presence of other vascular implants and grafts: Secondary | ICD-10-CM

## 2016-03-21 DIAGNOSIS — Z5111 Encounter for antineoplastic chemotherapy: Secondary | ICD-10-CM

## 2016-03-21 DIAGNOSIS — C159 Malignant neoplasm of esophagus, unspecified: Secondary | ICD-10-CM

## 2016-03-21 DIAGNOSIS — G620 Drug-induced polyneuropathy: Secondary | ICD-10-CM

## 2016-03-21 LAB — COMPREHENSIVE METABOLIC PANEL
ALT: 24 U/L (ref 0–55)
ANION GAP: 12 meq/L — AB (ref 3–11)
AST: 19 U/L (ref 5–34)
Albumin: 3.9 g/dL (ref 3.5–5.0)
Alkaline Phosphatase: 102 U/L (ref 40–150)
BILIRUBIN TOTAL: 0.34 mg/dL (ref 0.20–1.20)
BUN: 19.4 mg/dL (ref 7.0–26.0)
CALCIUM: 9 mg/dL (ref 8.4–10.4)
CO2: 24 meq/L (ref 22–29)
CREATININE: 0.8 mg/dL (ref 0.7–1.3)
Chloride: 100 mEq/L (ref 98–109)
EGFR: >90 mL/min/{1.73_m2} (ref >90)
Glucose: 147 mg/dl — ABNORMAL HIGH (ref 70–140)
Potassium: 3.9 mEq/L (ref 3.5–5.1)
Sodium: 137 mEq/L (ref 136–145)
TOTAL PROTEIN: 7.1 g/dL (ref 6.4–8.3)

## 2016-03-21 LAB — CBC WITH DIFFERENTIAL/PLATELET
BASO%: 0 % (ref 0.0–2.0)
Basophils Absolute: 0 10*3/uL (ref 0.0–0.1)
EOS%: 0 % (ref 0.0–7.0)
Eosinophils Absolute: 0 10*3/uL (ref 0.0–0.5)
HEMATOCRIT: 33.7 % — AB (ref 38.4–49.9)
HEMOGLOBIN: 11.4 g/dL — AB (ref 13.0–17.1)
LYMPH#: 0.7 10*3/uL — AB (ref 0.9–3.3)
LYMPH%: 4.9 % — ABNORMAL LOW (ref 14.0–49.0)
MCH: 34.5 pg — ABNORMAL HIGH (ref 27.2–33.4)
MCHC: 33.8 g/dL (ref 32.0–36.0)
MCV: 102.1 fL — ABNORMAL HIGH (ref 79.3–98.0)
MONO#: 0.7 10*3/uL (ref 0.1–0.9)
MONO%: 4.7 % (ref 0.0–14.0)
NEUT%: 90.4 % — ABNORMAL HIGH (ref 39.0–75.0)
NEUTROS ABS: 13.4 10*3/uL — AB (ref 1.5–6.5)
PLATELETS: 278 10*3/uL (ref 140–400)
RBC: 3.3 10*6/uL — ABNORMAL LOW (ref 4.20–5.82)
RDW: 20.3 % — AB (ref 11.0–14.6)
WBC: 14.8 10*3/uL — AB (ref 4.0–10.3)

## 2016-03-21 MED ORDER — DIPHENHYDRAMINE HCL 50 MG/ML IJ SOLN
INTRAMUSCULAR | Status: AC
  Filled 2016-03-21: qty 1

## 2016-03-21 MED ORDER — SODIUM CHLORIDE 0.9% FLUSH
10.0000 mL | INTRAVENOUS | Status: DC | PRN
  Administered 2016-03-21: 10 mL
  Filled 2016-03-21: qty 10

## 2016-03-21 MED ORDER — SODIUM CHLORIDE 0.9 % IV SOLN
INTRAVENOUS | Status: AC
  Administered 2016-03-21: 11:00:00 via INTRAVENOUS

## 2016-03-21 MED ORDER — FAMOTIDINE IN NACL 20-0.9 MG/50ML-% IV SOLN
20.0000 mg | Freq: Once | INTRAVENOUS | Status: AC
  Administered 2016-03-21: 20 mg via INTRAVENOUS

## 2016-03-21 MED ORDER — PALONOSETRON HCL INJECTION 0.25 MG/5ML
0.2500 mg | Freq: Once | INTRAVENOUS | Status: AC
  Administered 2016-03-21: 0.25 mg via INTRAVENOUS

## 2016-03-21 MED ORDER — SODIUM CHLORIDE 0.9 % IJ SOLN
10.0000 mL | INTRAMUSCULAR | Status: DC | PRN
  Administered 2016-03-21: 10 mL via INTRAVENOUS
  Filled 2016-03-21: qty 10

## 2016-03-21 MED ORDER — SODIUM CHLORIDE 0.9 % IV SOLN: Freq: Once | INTRAVENOUS | Status: DC

## 2016-03-21 MED ORDER — SODIUM CHLORIDE 0.9 % IV SOLN
175.0000 mg/m2 | Freq: Once | INTRAVENOUS | Status: AC
  Administered 2016-03-21: 294 mg via INTRAVENOUS
  Filled 2016-03-21: qty 49

## 2016-03-21 MED ORDER — SODIUM CHLORIDE 0.9 % IV SOLN
540.0000 mg | Freq: Once | INTRAVENOUS | Status: AC
  Administered 2016-03-21: 540 mg via INTRAVENOUS
  Filled 2016-03-21: qty 54

## 2016-03-21 MED ORDER — DENOSUMAB 120 MG/1.7ML ~~LOC~~ SOLN
120.0000 mg | Freq: Once | SUBCUTANEOUS | Status: AC
  Administered 2016-03-21: 120 mg via SUBCUTANEOUS
  Filled 2016-03-21: qty 1.7

## 2016-03-21 MED ORDER — PALONOSETRON HCL INJECTION 0.25 MG/5ML
INTRAVENOUS | Status: AC
  Filled 2016-03-21: qty 5

## 2016-03-21 MED ORDER — SODIUM CHLORIDE 0.9 % IV SOLN
20.0000 mg | Freq: Once | INTRAVENOUS | Status: AC
  Administered 2016-03-21: 20 mg via INTRAVENOUS
  Filled 2016-03-21: qty 2

## 2016-03-21 MED ORDER — ALTEPLASE 2 MG IJ SOLR
2.0000 mg | Freq: Once | INTRAMUSCULAR | Status: DC | PRN
  Filled 2016-03-21: qty 2

## 2016-03-21 MED ORDER — HEPARIN SOD (PORK) LOCK FLUSH 100 UNIT/ML IV SOLN
500.0000 [IU] | Freq: Once | INTRAVENOUS | Status: AC | PRN
  Administered 2016-03-21: 500 [IU]
  Filled 2016-03-21: qty 5

## 2016-03-21 MED ORDER — FAMOTIDINE IN NACL 20-0.9 MG/50ML-% IV SOLN
INTRAVENOUS | Status: AC
Start: 2016-03-21 — End: 2016-03-21
  Filled 2016-03-21: qty 50

## 2016-03-21 MED ORDER — HEPARIN SOD (PORK) LOCK FLUSH 100 UNIT/ML IV SOLN
500.0000 [IU] | Freq: Once | INTRAVENOUS | Status: DC | PRN
  Filled 2016-03-21: qty 5

## 2016-03-21 MED ORDER — DIPHENHYDRAMINE HCL 50 MG/ML IJ SOLN
50.0000 mg | Freq: Once | INTRAMUSCULAR | Status: AC
  Administered 2016-03-21: 50 mg via INTRAVENOUS

## 2016-03-21 NOTE — Progress Notes (Signed)
Oncology Nurse Navigator Documentation  Oncology Nurse Navigator Flowsheets 03/21/2016  Navigator Location [facility]-Med Onc  Navigator Encounter Type Treatment  Telephone -  Abnormal Finding Date -  Confirmed Diagnosis Date -  Treatment Initiated Date -  Patient Visit Type MedOnc  Treatment Phase Final Chemo TX  Barriers/Navigation Needs No barriers at this time;No Questions;No Needs  Education -  Interventions None required-reports feeling well and his employer is working with him. Allowed to come and go and take breaks as needed. He will see Dr. Irene Limbo in early August after his CT scan.  Referrals -  Coordination of Care -  Education Method -  Support Groups/Services -  Acuity -  Acuity Level 2 -  Time Spent with Patient 15

## 2016-03-21 NOTE — Patient Instructions (Signed)

## 2016-03-21 NOTE — Telephone Encounter (Signed)
Gave pt cal & avs °

## 2016-03-21 NOTE — Progress Notes (Signed)
Brief nutrition follow-up completed with patient during infusion for esophageal cancer.  Patient reports this is his final treatment. Patient reports that he feels well.  His appetite has improved. He continues to consume boost. Weight improved documented as 133.5 pounds July 18 increased from 131.2 pounds June 6. Patient denies nutrition impact symptoms.  Nutrition diagnosis: Unintended weight loss resolved.  Provided patient with additional coupons for boost at his request. Encouraged him to continue strategies for increased oral intake.   Recommended patient contact me for any questions or concerns. No follow-up is scheduled.  **Disclaimer: This note was dictated with voice recognition software. Similar sounding words can inadvertently be transcribed and this note may contain transcription errors which may not have been corrected upon publication of note.**

## 2016-03-21 NOTE — Patient Instructions (Signed)
[facility] Discharge Instructions for Patients Receiving Chemotherapy  Today you received the following chemotherapy agents Taxol and Carboplatin.  To help prevent nausea and vomiting after your treatment, we encourage you to take your nausea medication as prescribed.   If you develop nausea and vomiting that is not controlled by your nausea medication, call the clinic.   BELOW ARE SYMPTOMS THAT SHOULD BE REPORTED IMMEDIATELY:  *FEVER GREATER THAN 100.5 F  *CHILLS WITH OR WITHOUT FEVER  NAUSEA AND VOMITING THAT IS NOT CONTROLLED WITH YOUR NAUSEA MEDICATION  *UNUSUAL SHORTNESS OF BREATH  *UNUSUAL BRUISING OR BLEEDING  TENDERNESS IN MOUTH AND THROAT WITH OR WITHOUT PRESENCE OF ULCERS  *URINARY PROBLEMS  *BOWEL PROBLEMS  UNUSUAL RASH Items with * indicate a potential emergency and should be followed up as soon as possible.  Feel free to call the clinic you have any questions or concerns. The clinic phone number is [phone].  Please show the CHEMO ALERT CARD at check-in to the Emergency Department and triage nurse.   

## 2016-03-23 ENCOUNTER — Ambulatory Visit (HOSPITAL_BASED_OUTPATIENT_CLINIC_OR_DEPARTMENT_OTHER): Payer: 59

## 2016-03-23 VITALS — BP 122/72 | HR 83 | Temp 98.3°F | Resp 20

## 2016-03-23 DIAGNOSIS — C787 Secondary malignant neoplasm of liver and intrahepatic bile duct: Secondary | ICD-10-CM

## 2016-03-23 DIAGNOSIS — C155 Malignant neoplasm of lower third of esophagus: Secondary | ICD-10-CM | POA: Diagnosis not present

## 2016-03-23 DIAGNOSIS — C159 Malignant neoplasm of esophagus, unspecified: Secondary | ICD-10-CM

## 2016-03-23 MED ORDER — PEGFILGRASTIM INJECTION 6 MG/0.6ML ~~LOC~~
6.0000 mg | PREFILLED_SYRINGE | Freq: Once | SUBCUTANEOUS | Status: AC
  Administered 2016-03-23: 6 mg via SUBCUTANEOUS
  Filled 2016-03-23: qty 0.6

## 2016-03-23 NOTE — Patient Instructions (Signed)
Pegfilgrastim injection What is this medicine? PEGFILGRASTIM (PEG fil gra stim) is a long-acting granulocyte colony-stimulating factor that stimulates the growth of neutrophils, a type of white blood cell important in the body's fight against infection. It is used to reduce the incidence of fever and infection in patients with certain types of cancer who are receiving chemotherapy that affects the bone marrow, and to increase survival after being exposed to high doses of radiation. This medicine may be used for other purposes; ask your health care provider or pharmacist if you have questions. What should I tell my health care provider before I take this medicine? They need to know if you have any of these conditions: -kidney disease -latex allergy -ongoing radiation therapy -sickle cell disease -skin reactions to acrylic adhesives (On-Body Injector only) -an unusual or allergic reaction to pegfilgrastim, filgrastim, other medicines, foods, dyes, or preservatives -pregnant or trying to get pregnant -breast-feeding How should I use this medicine? This medicine is for injection under the skin. If you get this medicine at home, you will be taught how to prepare and give the pre-filled syringe or how to use the On-body Injector. Refer to the patient Instructions for Use for detailed instructions. Use exactly as directed. Take your medicine at regular intervals. Do not take your medicine more often than directed. It is important that you put your used needles and syringes in a special sharps container. Do not put them in a trash can. If you do not have a sharps container, call your pharmacist or healthcare provider to get one. Talk to your pediatrician regarding the use of this medicine in children. While this drug may be prescribed for selected conditions, precautions do apply. Overdosage: If you think you have taken too much of this medicine contact a poison control center or emergency room at  once. NOTE: This medicine is only for you. Do not share this medicine with others. What if I miss a dose? It is important not to miss your dose. Call your doctor or health care professional if you miss your dose. If you miss a dose due to an On-body Injector failure or leakage, a new dose should be administered as soon as possible using a single prefilled syringe for manual use. What may interact with this medicine? Interactions have not been studied. Give your health care provider a list of all the medicines, herbs, non-prescription drugs, or dietary supplements you use. Also tell them if you smoke, drink alcohol, or use illegal drugs. Some items may interact with your medicine. This list may not describe all possible interactions. Give your health care provider a list of all the medicines, herbs, non-prescription drugs, or dietary supplements you use. Also tell them if you smoke, drink alcohol, or use illegal drugs. Some items may interact with your medicine. What should I watch for while using this medicine? You may need blood work done while you are taking this medicine. If you are going to need a MRI, CT scan, or other procedure, tell your doctor that you are using this medicine (On-Body Injector only). What side effects may I notice from receiving this medicine? Side effects that you should report to your doctor or health care professional as soon as possible: -allergic reactions like skin rash, itching or hives, swelling of the face, lips, or tongue -dizziness -fever -pain, redness, or irritation at site where injected -pinpoint red spots on the skin -red or dark-brown urine -shortness of breath or breathing problems -stomach or side pain, or pain   at the shoulder -swelling -tiredness -trouble passing urine or change in the amount of urine Side effects that usually do not require medical attention (report to your doctor or health care professional if they continue or are  bothersome): -bone pain -muscle pain This list may not describe all possible side effects. Call your doctor for medical advice about side effects. You may report side effects to FDA at 1-800-FDA-1088. Where should I keep my medicine? Keep out of the reach of children. Store pre-filled syringes in a refrigerator between 2 and 8 degrees C (36 and 46 degrees F). Do not freeze. Keep in carton to protect from light. Throw away this medicine if it is left out of the refrigerator for more than 48 hours. Throw away any unused medicine after the expiration date. NOTE: This sheet is a summary. It may not cover all possible information. If you have questions about this medicine, talk to your doctor, pharmacist, or health care provider.    2016, Elsevier/Gold Standard. (2014-09-10 14:30:14)  

## 2016-03-23 NOTE — Progress Notes (Signed)
.    HEMATOLOGY/ONCOLOGY CLINIC NOTE  Date of Service: 03/21/2016    Patient Care Team: Daniel Paterson, MD as PCP - General (Internal Medicine)  CHIEF COMPLAINTS/PURPOSE OF CONSULTATION:  Metastatic Esophageal Squamous cell carcinoma   HISTORY OF PRESENTING ILLNESS: plz see my initial consultation regarding patients initial presentation  INTERVAL HISTORY  [name] is here for follow-up  Prior to cycle 6 of his planned carboplatin Taxol palliative chemotherapy for metastatic esophageal carcinoma. He notes no nausea no vomiting. No notable dysphagia at this time. Eating well and has maintained in stable or improved weight since his last visit. No fevers no chills no night sweats. Breathing is stable. Continues to have unchanged mild grade 1 nonpainful neuropathy in his lower extremities. Note some mild weakness for a couple of hours after the Xgeva. He was recommended to continue taking his oral calcium. No other acute new focal symptoms. Still continues to work part-time. Labs today looks stable.  Patient is agreeable with completing his sixth and last planned cycle of carboplatin and etoposide.  MEDICAL HISTORY:  Past Medical History  Diagnosis Date  . Hypertension   . Arthritis   . Knee pain   . Esophageal cancer (HCC)     SURGICAL HISTORY: Past Surgical History  Procedure Laterality Date  . Knee surgery    . Lesion removal  1969    hip  . Esophagogastroduodenoscopy (egd) with esophageal dilation      and biopsy    SOCIAL HISTORY: Social History   Social History  . Marital Status: Single    Spouse Name: N/A  . Number of Children: 2  . Years of Education: N/A   Occupational History  . Manufacturing industrial curtains    Social History Main Topics  . Smoking status: Current Every Day Smoker -- 0.25 packs/day for 20 years    Types: Cigarettes  . Smokeless tobacco: Never Used  . Alcohol Use: No  . Drug Use: Yes    Special: Marijuana     Comment: last  smoked 1 week ago  . Sexual Activity: Yes   Other Topics Concern  . 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  FAMILY HISTORY: Family History  Problem Relation Age of Onset  . Colon cancer Neg Hx   . Esophageal cancer Father   . Cancer Father     nasopharyngeal   . Diabetes Sister   . Cancer Sister     multiple myeloma    ALLERGIES:  has No Known Allergies.  MEDICATIONS:  Current Outpatient Prescriptions  Medication Sig Dispense Refill  . amLODipine (NORVASC) 5 MG tablet Take 5 mg by mouth daily.    . dexamethasone (DECADRON) 4 MG tablet Take 2 tablets (8 mg total) by mouth daily. Start the day after chemotherapy for 4 days. 30 tablet 1  . hydrocortisone (CORTEF) 20 MG tablet     . lidocaine-prilocaine (EMLA) cream Apply 1 application topically once. 30 g 1  . lisinopril-hydrochlorothiazide (PRINZIDE,ZESTORETIC) 10-12.5 MG tablet     . LORazepam (ATIVAN) 0.5 MG tablet TAKE 1 TABLET BY MOUTH EVERY 6   HOURS AS NEEDED FOR NAUSEA AND VOMITING 30 tablet 0  . meloxicam (MOBIC) 15 MG tablet Take 15 mg by mouth daily.    [name] Kitchen omeprazole (PRILOSEC) 20 MG capsule TAKE ONE CAPSULE BY MOUTH TWICE DAILY BEFORE A MEAL 60 capsule 0  . ondansetron (ZOFRAN) 8 MG tablet Take 1 tablet (8 mg total) by mouth every 8 (eight) hours as needed for nausea. 30 tablet 3  . oxyCODONE (ROXICODONE) 5 MG/5ML solution Take 5-10 mLs (5-10 mg total) by mouth every 4 (four) hours as needed for moderate pain or severe pain. 200 mL 0  . prochlorperazine (COMPAZINE) 10 MG tablet Take 1 tablet (10 mg total) by mouth every 6 (six) hours as needed (Nausea or vomiting). 30 tablet 1  . VIAGRA 100 MG tablet  Reported on 10/22/2015     No current facility-administered medications for this visit.    REVIEW OF SYSTEMS:    10 Point review of Systems was done is negative except as noted above.  PHYSICAL EXAMINATION: ECOG PERFORMANCE STATUS: 1 - Symptomatic but completely ambulatory  . Filed Vitals:   03/21/16 0824  BP: 130/74  Pulse: 78  Temp: 98.6 F (37 C)  Resp: 18   Filed Weights   03/21/16 0824  Weight: 133 lb 8 oz (60.555 kg)   .Body mass index is 19.71 kg/(m^2).  [name] Kitchen Wt Readings from Last 3 Encounters:  03/21/16 133 lb 8 oz (60.555 kg)  02/29/16 132 lb 11.2 oz (60.192 kg)  02/08/16 131 lb 3.2 oz (59.512 kg)   GENERAL:middle aged AAM ,alert, in no acute distress and comfortable SKIN: skin color, texture, turgor are normal, no rashes or significant lesions EYES: normal, conjunctiva are pink and non-injected, sclera clear OROPHARYNX:no exudate, no erythema and lips, buccal mucosa, and tongue normal  NECK: supple, no JVD, thyroid normal size, non-tender, without nodularity LYMPH:  no palpable lymphadenopathy in the cervical, axillary or inguinal LUNGS: clear to auscultation with normal respiratory effort HEART: regular rate & rhythm,  no murmurs and no lower extremity edema ABDOMEN: abdomen soft, non-tender, normoactive bowel sounds  Musculoskeletal: no cyanosis of digits and no clubbing  PSYCH: alert & oriented x 3 with fluent speech NEURO: no focal motor/sensory deficits  LABORATORY DATA:  I have reviewed the data as listed  . CBC Latest Ref Rng 03/21/2016 02/29/2016 02/08/2016  WBC 4.0 - 10.3 10e3/uL 14.8(H) 13.8(H) 16.0(H)  Hemoglobin 13.0 - 17.1 g/dL 11.4(L) 11.4(L) 10.9(L)  Hematocrit 38.4 - 49.9 % 33.7(L) 34.6(L) 32.6(L)  Platelets 140 - 400 10e3/uL 278 310 363   . CBC    Component Value Date/Time   WBC 14.8* 03/21/2016 0806   WBC 5.4 12/08/2015 1149   RBC 3.30* 03/21/2016 0806   RBC 3.84* 12/08/2015 1149   HGB 11.4* 03/21/2016 0806   HGB 11.5* 12/08/2015  1149   HCT 33.7* 03/21/2016 0806   HCT 33.8* 12/08/2015 1149   PLT 278 03/21/2016 0806   PLT 428* 12/08/2015 1149   MCV 102.1* 03/21/2016 0806   MCV 88.0 12/08/2015 1149   MCH 34.5* 03/21/2016 0806   MCH 29.9 12/08/2015 1149   MCHC 33.8 03/21/2016 0806   MCHC 34.0 12/08/2015 1149   RDW 20.3* 03/21/2016 0806   RDW 15.7* 12/08/2015 1149   LYMPHSABS 0.7* 03/21/2016 0806   LYMPHSABS 1.3 12/08/2015 1149   MONOABS 0.7 03/21/2016 0806   MONOABS 0.5 12/08/2015 1149   EOSABS 0.0 03/21/2016 0806   EOSABS 0.1 12/08/2015 1149   BASOSABS 0.0 03/21/2016 0806   BASOSABS  0.0 12/08/2015 1149    CMP Latest Ref Rng 03/21/2016 02/29/2016 02/08/2016  Glucose 70 - 140 mg/dl 147(H) 115 119  BUN 7.0 - 26.0 mg/dL 19.4 17.4 13.9  Creatinine 0.7 - 1.3 mg/dL 0.8 0.8 0.8  Sodium 136 - 145 mEq/L 137 137 137  Potassium 3.5 - 5.1 mEq/L 3.9 4.0 3.7  CO2 22 - 29 mEq/L 24 29 28  Calcium 8.4 - 10.4 mg/dL 9.0 9.8 9.5  Total Protein 6.4 - 8.3 g/dL 7.1 7.2 7.2  Total Bilirubin 0.20 - 1.20 mg/dL 0.34 <0.30 0.38  Alkaline Phos 40 - 150 U/L 102 124 113  AST 5 - 34 U/L 19 15 12  ALT 0 - 55 U/L 24 21 13    RADIOGRAPHIC STUDIES: I have personally reviewed the radiological images as listed and agreed with the findings in the report. No results found.  ASSESSMENT & PLAN:   54 yo AAM with ECOG PS of 1 with   1) Metastatic moderately to poorly differentiated 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 S/p palliative radiation therapy to the esophagus and regional lymph nodes. He has tolerated treatment generally well except for some mild grade 2 radiation esophagitis which has now resolved. Repeat CT chest abdomen pelvis done 11/17/2015 show progression of his liver metastases and gastrohepatic lymph node as expected.  He has completed his 5 cycles of carboplatin and Taxol  He is here for follow-up prior to his 6th cycle. He  notes no prohibitive toxicities at this time. He has no symptoms suggestive of disease progression at this time. CT chest abdomen pelvis (02/03/2016) shows significant improvement in his liver lesions and gastrohepatic ligament adenopathy and mediastinal lymph nodes. Some bone changes concerning for possible early bony metastases versus reactive changes from schmorl nodes  2) HTN- patient notes his appetite has improved and he is eating better. On amlodipine. 3) Dysphagia due to esophageal SCC -mostly resolved 4) grade 2 radiation esophagitis. Resolved . 5) leukocytosis likely due to Neulasta. No evidence of infection at this time. 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 This remains unchanged and nonpainful. Plan -stable and patient has no prohibitive toxicities.  -- patient is appropriate to proceed with his 6th cycle of carboplatin plus Taxol at current doses and with current pre-medications and G-CSF support. Continue  on Xgeva given concern for possible bony metastases. -Monitor for worsening neuropathy. -We will plan to complete 6 cycles of carboplatin and Taxol (this being the last cycle) -he will need repeat CT chest abdomen pelvis and bone scan in 3 weeks for reevaluation of disease status and plan for the treatment strategy. -if he has stable disease with only a couple of active areas of disease we will consider possible ablation of these area to try to a allow the patient some time off systemic therapies (if can achieve NED status). - recommended continued optimization of his nutrition and maintaining good hydration.  All of his questions and concerns were answered in detail.   return to care with Dr. Kale in 3 weeks with repeat labs, CT Chest/abd/pelvis and Bone Scan    I spent 20 minutes counseling the patient face to face. The total time spent in the appointment was 25 minutes and more than 50% was on counseling and direct patient  cares.    Gautam Kale MD MS AAHIVMS [facility] C[facility] Hematology/Oncology Physician [facility]  (  Office):       [phone] (Work cell):  [phone] (Fax):           [phone] 

## 2016-04-06 ENCOUNTER — Encounter (HOSPITAL_COMMUNITY)
Admission: RE | Admit: 2016-04-06 | Discharge: 2016-04-06 | Disposition: A | Discharge status: Home / Self Care | Payer: 59 | Source: Ambulatory Visit | Attending: Hematology | Admitting: Hematology

## 2016-04-06 DIAGNOSIS — M5136 Other intervertebral disc degeneration, lumbar region: Secondary | ICD-10-CM | POA: Diagnosis not present

## 2016-04-06 DIAGNOSIS — C787 Secondary malignant neoplasm of liver and intrahepatic bile duct: Secondary | ICD-10-CM | POA: Diagnosis present

## 2016-04-06 DIAGNOSIS — C7951 Secondary malignant neoplasm of bone: Secondary | ICD-10-CM | POA: Diagnosis present

## 2016-04-06 DIAGNOSIS — C155 Malignant neoplasm of lower third of esophagus: Secondary | ICD-10-CM | POA: Insufficient documentation

## 2016-04-06 MED ORDER — TECHNETIUM TC 99M MEDRONATE IV KIT
25.0000 | PACK | Freq: Once | INTRAVENOUS | Status: AC | PRN
  Administered 2016-04-06: 25 via INTRAVENOUS

## 2016-04-10 ENCOUNTER — Ambulatory Visit (HOSPITAL_COMMUNITY)
Admission: RE | Admit: 2016-04-10 | Discharge: 2016-04-10 | Disposition: A | Discharge status: Home / Self Care | Payer: 59 | Source: Ambulatory Visit | Attending: Hematology | Admitting: Hematology

## 2016-04-10 ENCOUNTER — Other Ambulatory Visit: Payer: Self-pay | Admitting: Hematology

## 2016-04-10 ENCOUNTER — Encounter (HOSPITAL_COMMUNITY): Payer: Self-pay

## 2016-04-10 DIAGNOSIS — C7951 Secondary malignant neoplasm of bone: Secondary | ICD-10-CM | POA: Diagnosis present

## 2016-04-10 DIAGNOSIS — C155 Malignant neoplasm of lower third of esophagus: Secondary | ICD-10-CM

## 2016-04-10 DIAGNOSIS — I700 Atherosclerosis of aorta: Secondary | ICD-10-CM | POA: Insufficient documentation

## 2016-04-10 DIAGNOSIS — C159 Malignant neoplasm of esophagus, unspecified: Secondary | ICD-10-CM

## 2016-04-10 DIAGNOSIS — I2510 Atherosclerotic heart disease of native coronary artery without angina pectoris: Secondary | ICD-10-CM | POA: Diagnosis not present

## 2016-04-10 DIAGNOSIS — C787 Secondary malignant neoplasm of liver and intrahepatic bile duct: Secondary | ICD-10-CM | POA: Diagnosis present

## 2016-04-10 DIAGNOSIS — M899 Disorder of bone, unspecified: Secondary | ICD-10-CM | POA: Insufficient documentation

## 2016-04-10 MED ORDER — IOPAMIDOL (ISOVUE-300) INJECTION 61%
100.0000 mL | Freq: Once | INTRAVENOUS | Status: AC | PRN
  Administered 2016-04-10: 100 mL via INTRAVENOUS

## 2016-04-11 ENCOUNTER — Encounter: Payer: Self-pay | Admitting: Hematology

## 2016-04-11 ENCOUNTER — Other Ambulatory Visit: Payer: Self-pay | Admitting: *Deleted

## 2016-04-11 ENCOUNTER — Ambulatory Visit (HOSPITAL_BASED_OUTPATIENT_CLINIC_OR_DEPARTMENT_OTHER): Payer: 59

## 2016-04-11 ENCOUNTER — Telehealth: Payer: Self-pay | Admitting: Hematology

## 2016-04-11 ENCOUNTER — Other Ambulatory Visit (HOSPITAL_BASED_OUTPATIENT_CLINIC_OR_DEPARTMENT_OTHER): Payer: 59

## 2016-04-11 ENCOUNTER — Ambulatory Visit (HOSPITAL_BASED_OUTPATIENT_CLINIC_OR_DEPARTMENT_OTHER): Payer: 59 | Admitting: Hematology

## 2016-04-11 VITALS — BP 125/85 | HR 88 | Temp 98.8°F | Resp 18 | Ht 69.0 in | Wt 131.9 lb

## 2016-04-11 DIAGNOSIS — C787 Secondary malignant neoplasm of liver and intrahepatic bile duct: Secondary | ICD-10-CM

## 2016-04-11 DIAGNOSIS — C7951 Secondary malignant neoplasm of bone: Secondary | ICD-10-CM

## 2016-04-11 DIAGNOSIS — C155 Malignant neoplasm of lower third of esophagus: Secondary | ICD-10-CM | POA: Diagnosis not present

## 2016-04-11 DIAGNOSIS — C154 Malignant neoplasm of middle third of esophagus: Secondary | ICD-10-CM | POA: Diagnosis not present

## 2016-04-11 DIAGNOSIS — C159 Malignant neoplasm of esophagus, unspecified: Secondary | ICD-10-CM

## 2016-04-11 DIAGNOSIS — Z95828 Presence of other vascular implants and grafts: Secondary | ICD-10-CM

## 2016-04-11 LAB — COMPREHENSIVE METABOLIC PANEL
ALT: 24 U/L (ref 0–55)
AST: 21 U/L (ref 5–34)
Albumin: 4.1 g/dL (ref 3.5–5.0)
Alkaline Phosphatase: 111 U/L (ref 40–150)
Anion Gap: 12 mEq/L — ABNORMAL HIGH (ref 3–11)
BILIRUBIN TOTAL: 0.39 mg/dL (ref 0.20–1.20)
BUN: 15.7 mg/dL (ref 7.0–26.0)
CO2: 26 meq/L (ref 22–29)
Calcium: 9.9 mg/dL (ref 8.4–10.4)
Chloride: 99 mEq/L (ref 98–109)
Creatinine: 0.9 mg/dL (ref 0.7–1.3)
GLUCOSE: 134 mg/dL (ref 70–140)
Potassium: 4 mEq/L (ref 3.5–5.1)
SODIUM: 138 meq/L (ref 136–145)
TOTAL PROTEIN: 7.7 g/dL (ref 6.4–8.3)

## 2016-04-11 LAB — CBC & DIFF AND RETIC
BASO%: 0.1 % (ref 0.0–2.0)
Basophils Absolute: 0 10*3/uL (ref 0.0–0.1)
EOS%: 0.1 % (ref 0.0–7.0)
Eosinophils Absolute: 0 10*3/uL (ref 0.0–0.5)
HCT: 36.1 % — ABNORMAL LOW (ref 38.4–49.9)
HGB: 12.3 g/dL — ABNORMAL LOW (ref 13.0–17.1)
IMMATURE RETIC FRACT: 13 % — AB (ref 3.00–10.60)
LYMPH#: 1 10*3/uL (ref 0.9–3.3)
LYMPH%: 5.2 % — AB (ref 14.0–49.0)
MCH: 35.8 pg — ABNORMAL HIGH (ref 27.2–33.4)
MCHC: 34.1 g/dL (ref 32.0–36.0)
MCV: 104.9 fL — AB (ref 79.3–98.0)
MONO#: 0.5 10*3/uL (ref 0.1–0.9)
MONO%: 2.3 % (ref 0.0–14.0)
NEUT%: 92.3 % — ABNORMAL HIGH (ref 39.0–75.0)
NEUTROS ABS: 18.2 10*3/uL — AB (ref 1.5–6.5)
Platelets: 277 10*3/uL (ref 140–400)
RBC: 3.44 10*6/uL — AB (ref 4.20–5.82)
RDW: 17.7 % — AB (ref 11.0–14.6)
RETIC CT ABS: 79.81 10*3/uL (ref 34.80–93.90)
Retic %: 2.32 % — ABNORMAL HIGH (ref 0.80–1.80)
WBC: 19.7 10*3/uL — AB (ref 4.0–10.3)

## 2016-04-11 MED ORDER — SODIUM CHLORIDE 0.9 % IJ SOLN
10.0000 mL | INTRAMUSCULAR | Status: DC | PRN
  Administered 2016-04-11: 10 mL via INTRAVENOUS
  Filled 2016-04-11: qty 10

## 2016-04-11 MED ORDER — HEPARIN SOD (PORK) LOCK FLUSH 100 UNIT/ML IV SOLN
500.0000 [IU] | Freq: Once | INTRAVENOUS | Status: AC | PRN
  Administered 2016-04-11: 500 [IU] via INTRAVENOUS
  Filled 2016-04-11: qty 5

## 2016-04-11 NOTE — Patient Instructions (Signed)

## 2016-04-11 NOTE — Telephone Encounter (Signed)
Gave pt cal & avs °

## 2016-04-11 NOTE — Progress Notes (Unsigned)
Patient and his mother came in with all of his medical bills needing assistance sorting them. Organized his bills by account number and company and advised him for the non [place] bills he would need to contact the billing department to each company and give them the account number on his bills to discuss financial assistance or arrangements with them. For the [facility] bills, I discussed possible financial hardship and gave him an application and the list of what to submit with the application. Patient is only working a couple of days a week so I also offered him a Insurance underwriter. I explained to him what to complete and could use the same documentation for the [facility] FAA to submit with Medicaid. I have a copy of his bills as well to submit with his Medicaid application if needed. Patient verbalized understanding and has in writing what to bring back.

## 2016-04-14 ENCOUNTER — Emergency Department (HOSPITAL_COMMUNITY): Payer: 59

## 2016-04-14 ENCOUNTER — Encounter (HOSPITAL_COMMUNITY): Payer: Self-pay | Admitting: Emergency Medicine

## 2016-04-14 ENCOUNTER — Encounter (HOSPITAL_COMMUNITY)
Admission: EM | Disposition: A | Discharge status: Home / Self Care | Payer: Self-pay | Source: Home / Self Care | Attending: Orthopedic Surgery

## 2016-04-14 ENCOUNTER — Inpatient Hospital Stay (HOSPITAL_COMMUNITY): Payer: 59 | Admitting: Anesthesiology

## 2016-04-14 ENCOUNTER — Inpatient Hospital Stay (HOSPITAL_COMMUNITY)
Admission: EM | Admit: 2016-04-14 | Discharge: 2016-04-17 | DRG: 489 | Disposition: A | Discharge status: Home / Self Care | Payer: 59 | Attending: Orthopedic Surgery | Admitting: Orthopedic Surgery

## 2016-04-14 DIAGNOSIS — I10 Essential (primary) hypertension: Secondary | ICD-10-CM | POA: Diagnosis present

## 2016-04-14 DIAGNOSIS — M25561 Pain in right knee: Secondary | ICD-10-CM | POA: Diagnosis present

## 2016-04-14 DIAGNOSIS — M009 Pyogenic arthritis, unspecified: Secondary | ICD-10-CM | POA: Diagnosis present

## 2016-04-14 DIAGNOSIS — F17210 Nicotine dependence, cigarettes, uncomplicated: Secondary | ICD-10-CM | POA: Diagnosis present

## 2016-04-14 DIAGNOSIS — Z8501 Personal history of malignant neoplasm of esophagus: Secondary | ICD-10-CM

## 2016-04-14 DIAGNOSIS — K219 Gastro-esophageal reflux disease without esophagitis: Secondary | ICD-10-CM | POA: Diagnosis present

## 2016-04-14 DIAGNOSIS — R0602 Shortness of breath: Secondary | ICD-10-CM

## 2016-04-14 HISTORY — PX: IRRIGATION AND DEBRIDEMENT KNEE: SHX5185

## 2016-04-14 LAB — CBC WITH DIFFERENTIAL/PLATELET
Basophils Absolute: 0 10*3/uL (ref 0.0–0.1)
Basophils Relative: 0 %
Eosinophils Absolute: 0 10*3/uL (ref 0.0–0.7)
Eosinophils Relative: 0 %
HEMATOCRIT: 33.8 % — AB (ref 39.0–52.0)
HEMOGLOBIN: 11.6 g/dL — AB (ref 13.0–17.0)
LYMPHS ABS: 0.9 10*3/uL (ref 0.7–4.0)
LYMPHS PCT: 5 %
MCH: 35.7 pg — AB (ref 26.0–34.0)
MCHC: 34.3 g/dL (ref 30.0–36.0)
MCV: 104 fL — ABNORMAL HIGH (ref 78.0–100.0)
MONO ABS: 2.3 10*3/uL — AB (ref 0.1–1.0)
Monocytes Relative: 12 %
NEUTROS ABS: 16.2 10*3/uL — AB (ref 1.7–7.7)
Neutrophils Relative %: 83 %
Platelets: 281 10*3/uL (ref 150–400)
RBC: 3.25 MIL/uL — ABNORMAL LOW (ref 4.22–5.81)
RDW: 16.7 % — ABNORMAL HIGH (ref 11.5–15.5)
WBC: 19.5 10*3/uL — ABNORMAL HIGH (ref 4.0–10.5)

## 2016-04-14 LAB — SYNOVIAL CELL COUNT + DIFF, W/ CRYSTALS
Monocyte-Macrophage-Synovial Fluid: 10 % — ABNORMAL LOW (ref 50–90)
NEUTROPHIL, SYNOVIAL: 90 % — AB (ref 0–25)
WBC, Synovial: 87000 /mm3 — ABNORMAL HIGH (ref 0–200)

## 2016-04-14 LAB — URINALYSIS, ROUTINE W REFLEX MICROSCOPIC
BILIRUBIN URINE: NEGATIVE
Glucose, UA: NEGATIVE mg/dL
KETONES UR: 40 mg/dL — AB
LEUKOCYTES UA: NEGATIVE
Nitrite: NEGATIVE
PROTEIN: NEGATIVE mg/dL
Specific Gravity, Urine: 1.018 (ref 1.005–1.030)
pH: 7 (ref 5.0–8.0)

## 2016-04-14 LAB — COMPREHENSIVE METABOLIC PANEL
ALBUMIN: 4.2 g/dL (ref 3.5–5.0)
ALT: 23 U/L (ref 17–63)
ANION GAP: 12 (ref 5–15)
AST: 24 U/L (ref 15–41)
Alkaline Phosphatase: 78 U/L (ref 38–126)
BILIRUBIN TOTAL: 1.5 mg/dL — AB (ref 0.3–1.2)
BUN: 18 mg/dL (ref 6–20)
CHLORIDE: 92 mmol/L — AB (ref 101–111)
CO2: 27 mmol/L (ref 22–32)
Calcium: 8.5 mg/dL — ABNORMAL LOW (ref 8.9–10.3)
Creatinine, Ser: 0.81 mg/dL (ref 0.61–1.24)
GFR calc Af Amer: >60 mL/min (ref >60)
GFR calc non Af Amer: >60 mL/min (ref >60)
GLUCOSE: 87 mg/dL (ref 65–99)
POTASSIUM: 3.4 mmol/L — AB (ref 3.5–5.1)
SODIUM: 131 mmol/L — AB (ref 135–145)
TOTAL PROTEIN: 7.8 g/dL (ref 6.5–8.1)

## 2016-04-14 LAB — URINE MICROSCOPIC-ADD ON

## 2016-04-14 LAB — GRAM STAIN

## 2016-04-14 LAB — SEDIMENTATION RATE: Sed Rate: 121 mm/hr — ABNORMAL HIGH (ref 0–16)

## 2016-04-14 LAB — I-STAT CG4 LACTIC ACID, ED: Lactic Acid, Venous: 0.96 mmol/L (ref 0.5–1.9)

## 2016-04-14 SURGERY — IRRIGATION AND DEBRIDEMENT KNEE
Anesthesia: General | Site: Knee | Laterality: Right

## 2016-04-14 MED ORDER — EPHEDRINE SULFATE 50 MG/ML IJ SOLN
INTRAMUSCULAR | Status: DC | PRN
  Administered 2016-04-14: 5 mg via INTRAVENOUS

## 2016-04-14 MED ORDER — DOCUSATE SODIUM 100 MG PO CAPS
100.0000 mg | ORAL_CAPSULE | Freq: Two times a day (BID) | ORAL | Status: DC
  Administered 2016-04-14 – 2016-04-17 (×6): 100 mg via ORAL
  Filled 2016-04-14 (×6): qty 1

## 2016-04-14 MED ORDER — MIDAZOLAM HCL 5 MG/5ML IJ SOLN
INTRAMUSCULAR | Status: DC | PRN
  Administered 2016-04-14: 2 mg via INTRAVENOUS

## 2016-04-14 MED ORDER — ACETAMINOPHEN 650 MG RE SUPP: 650.0000 mg | Freq: Four times a day (QID) | RECTAL | Status: DC | PRN

## 2016-04-14 MED ORDER — VANCOMYCIN HCL IN DEXTROSE 750-5 MG/150ML-% IV SOLN
750.0000 mg | Freq: Three times a day (TID) | INTRAVENOUS | Status: DC
  Administered 2016-04-14 – 2016-04-15 (×2): 750 mg via INTRAVENOUS
  Filled 2016-04-14 (×3): qty 150

## 2016-04-14 MED ORDER — ONDANSETRON HCL 8 MG PO TABS: 8.0000 mg | ORAL_TABLET | Freq: Three times a day (TID) | ORAL | Status: DC | PRN

## 2016-04-14 MED ORDER — DEXAMETHASONE 4 MG PO TABS: 8.0000 mg | ORAL_TABLET | Freq: Every day | ORAL | Status: DC

## 2016-04-14 MED ORDER — ACETAMINOPHEN 325 MG PO TABS: 650.0000 mg | ORAL_TABLET | Freq: Four times a day (QID) | ORAL | Status: DC | PRN

## 2016-04-14 MED ORDER — SILDENAFIL CITRATE 100 MG PO TABS: 100.0000 mg | ORAL_TABLET | Freq: Every day | ORAL | Status: DC | PRN

## 2016-04-14 MED ORDER — AMLODIPINE BESYLATE 5 MG PO TABS
5.0000 mg | ORAL_TABLET | Freq: Every day | ORAL | Status: DC
  Administered 2016-04-15 – 2016-04-17 (×3): 5 mg via ORAL
  Filled 2016-04-14 (×3): qty 1

## 2016-04-14 MED ORDER — LACTATED RINGERS IV SOLN: INTRAVENOUS | Status: DC

## 2016-04-14 MED ORDER — LORAZEPAM 0.5 MG PO TABS: 0.5000 mg | ORAL_TABLET | Freq: Four times a day (QID) | ORAL | Status: DC | PRN

## 2016-04-14 MED ORDER — LIDOCAINE HCL (CARDIAC) 20 MG/ML IV SOLN
INTRAVENOUS | Status: AC
  Filled 2016-04-14: qty 5

## 2016-04-14 MED ORDER — PROPOFOL 10 MG/ML IV BOLUS
INTRAVENOUS | Status: AC
  Filled 2016-04-14: qty 20

## 2016-04-14 MED ORDER — PROCHLORPERAZINE MALEATE 10 MG PO TABS: 10.0000 mg | ORAL_TABLET | Freq: Four times a day (QID) | ORAL | Status: DC | PRN

## 2016-04-14 MED ORDER — ONDANSETRON HCL 4 MG/2ML IJ SOLN
INTRAMUSCULAR | Status: AC
  Filled 2016-04-14: qty 2

## 2016-04-14 MED ORDER — METHOCARBAMOL 1000 MG/10ML IJ SOLN
500.0000 mg | Freq: Four times a day (QID) | INTRAVENOUS | Status: DC | PRN
  Filled 2016-04-14: qty 5

## 2016-04-14 MED ORDER — HYDROCORTISONE 20 MG PO TABS
20.0000 mg | ORAL_TABLET | Freq: Two times a day (BID) | ORAL | Status: DC
  Administered 2016-04-14 – 2016-04-17 (×6): 20 mg via ORAL
  Filled 2016-04-14 (×6): qty 1

## 2016-04-14 MED ORDER — HYDROCODONE-ACETAMINOPHEN 5-325 MG PO TABS
1.0000 | ORAL_TABLET | ORAL | Status: DC | PRN
  Administered 2016-04-14 – 2016-04-16 (×3): 2 via ORAL
  Filled 2016-04-14 (×3): qty 2

## 2016-04-14 MED ORDER — HYDROMORPHONE HCL 1 MG/ML IJ SOLN: 0.5000 mg | INTRAMUSCULAR | Status: DC | PRN

## 2016-04-14 MED ORDER — METOCLOPRAMIDE HCL 5 MG/ML IJ SOLN: 5.0000 mg | Freq: Three times a day (TID) | INTRAMUSCULAR | Status: DC | PRN

## 2016-04-14 MED ORDER — LISINOPRIL 10 MG PO TABS
10.0000 mg | ORAL_TABLET | Freq: Every day | ORAL | Status: DC
  Administered 2016-04-15 – 2016-04-17 (×3): 10 mg via ORAL
  Filled 2016-04-14 (×3): qty 1

## 2016-04-14 MED ORDER — ENOXAPARIN SODIUM 40 MG/0.4ML ~~LOC~~ SOLN: 40.0000 mg | SUBCUTANEOUS | Status: DC

## 2016-04-14 MED ORDER — FENTANYL CITRATE (PF) 250 MCG/5ML IJ SOLN
INTRAMUSCULAR | Status: AC
  Filled 2016-04-14: qty 5

## 2016-04-14 MED ORDER — 0.9 % SODIUM CHLORIDE (POUR BTL) OPTIME
TOPICAL | Status: DC | PRN
  Administered 2016-04-14: 1000 mL

## 2016-04-14 MED ORDER — LISINOPRIL-HYDROCHLOROTHIAZIDE 10-12.5 MG PO TABS: 1.0000 | ORAL_TABLET | Freq: Every day | ORAL | Status: DC

## 2016-04-14 MED ORDER — POLYETHYLENE GLYCOL 3350 17 G PO PACK: 17.0000 g | PACK | Freq: Every day | ORAL | Status: DC | PRN

## 2016-04-14 MED ORDER — METOCLOPRAMIDE HCL 5 MG PO TABS: 5.0000 mg | ORAL_TABLET | Freq: Three times a day (TID) | ORAL | Status: DC | PRN

## 2016-04-14 MED ORDER — METHOCARBAMOL 500 MG PO TABS: 500.0000 mg | ORAL_TABLET | Freq: Four times a day (QID) | ORAL | Status: DC | PRN

## 2016-04-14 MED ORDER — LACTATED RINGERS IV SOLN
INTRAVENOUS | Status: DC | PRN
  Administered 2016-04-14: 18:00:00 via INTRAVENOUS

## 2016-04-14 MED ORDER — ONDANSETRON HCL 4 MG/2ML IJ SOLN: 4.0000 mg | Freq: Four times a day (QID) | INTRAMUSCULAR | Status: DC | PRN

## 2016-04-14 MED ORDER — LIDOCAINE-EPINEPHRINE 1 %-1:100000 IJ SOLN
INTRAMUSCULAR | Status: AC
  Administered 2016-04-14: 1 mL
  Filled 2016-04-14: qty 1

## 2016-04-14 MED ORDER — OXYCODONE HCL 5 MG/5ML PO SOLN: 5.0000 mg | ORAL | Status: DC | PRN

## 2016-04-14 MED ORDER — SODIUM CHLORIDE 0.9 % IV BOLUS (SEPSIS)
1000.0000 mL | Freq: Once | INTRAVENOUS | Status: AC
  Administered 2016-04-14: 1000 mL via INTRAVENOUS

## 2016-04-14 MED ORDER — HYDROMORPHONE HCL 1 MG/ML IJ SOLN
0.5000 mg | Freq: Once | INTRAMUSCULAR | Status: AC
  Administered 2016-04-14: 0.5 mg via INTRAVENOUS
  Filled 2016-04-14: qty 1

## 2016-04-14 MED ORDER — PANTOPRAZOLE SODIUM 40 MG PO TBEC
40.0000 mg | DELAYED_RELEASE_TABLET | Freq: Every day | ORAL | Status: DC
  Administered 2016-04-15 – 2016-04-17 (×3): 40 mg via ORAL
  Filled 2016-04-14 (×3): qty 1

## 2016-04-14 MED ORDER — PIPERACILLIN-TAZOBACTAM 3.375 G IVPB 30 MIN
3.3750 g | Freq: Once | INTRAVENOUS | Status: AC
  Administered 2016-04-14: 3.375 g via INTRAVENOUS
  Filled 2016-04-14: qty 50

## 2016-04-14 MED ORDER — ONDANSETRON HCL 4 MG PO TABS
4.0000 mg | ORAL_TABLET | Freq: Four times a day (QID) | ORAL | Status: DC | PRN
Start: 2016-04-14 — End: 2016-04-14

## 2016-04-14 MED ORDER — ENOXAPARIN SODIUM 40 MG/0.4ML ~~LOC~~ SOLN
40.0000 mg | SUBCUTANEOUS | Status: DC
  Administered 2016-04-15 – 2016-04-17 (×3): 40 mg via SUBCUTANEOUS
  Filled 2016-04-14 (×3): qty 0.4

## 2016-04-14 MED ORDER — HYDROMORPHONE HCL 1 MG/ML IJ SOLN: 1.0000 mg | Freq: Once | INTRAMUSCULAR | Status: DC

## 2016-04-14 MED ORDER — PHENYLEPHRINE HCL 10 MG/ML IJ SOLN
INTRAMUSCULAR | Status: DC | PRN
  Administered 2016-04-14 (×3): 80 ug via INTRAVENOUS

## 2016-04-14 MED ORDER — ONDANSETRON HCL 4 MG PO TABS: 4.0000 mg | ORAL_TABLET | Freq: Four times a day (QID) | ORAL | Status: DC | PRN

## 2016-04-14 MED ORDER — SODIUM CHLORIDE 0.9 % IV SOLN
INTRAVENOUS | Status: DC
  Administered 2016-04-14 – 2016-04-16 (×3): via INTRAVENOUS

## 2016-04-14 MED ORDER — PROPOFOL 10 MG/ML IV BOLUS
INTRAVENOUS | Status: DC | PRN
  Administered 2016-04-14: 200 mg via INTRAVENOUS

## 2016-04-14 MED ORDER — HYDROMORPHONE HCL 1 MG/ML IJ SOLN
0.2500 mg | INTRAMUSCULAR | Status: DC | PRN
  Administered 2016-04-14 (×2): 0.5 mg via INTRAVENOUS

## 2016-04-14 MED ORDER — ONDANSETRON HCL 4 MG/2ML IJ SOLN
INTRAMUSCULAR | Status: DC | PRN
  Administered 2016-04-14: 4 mg via INTRAVENOUS

## 2016-04-14 MED ORDER — LIDOCAINE HCL (CARDIAC) 20 MG/ML IV SOLN
INTRAVENOUS | Status: DC | PRN
  Administered 2016-04-14: 100 mg via INTRAVENOUS

## 2016-04-14 MED ORDER — MIDAZOLAM HCL 2 MG/2ML IJ SOLN
INTRAMUSCULAR | Status: AC
  Filled 2016-04-14: qty 2

## 2016-04-14 MED ORDER — FENTANYL CITRATE (PF) 100 MCG/2ML IJ SOLN
INTRAMUSCULAR | Status: DC | PRN
Start: 2016-04-14 — End: 2016-04-14
  Administered 2016-04-14: 25 ug via INTRAVENOUS
  Administered 2016-04-14: 50 ug via INTRAVENOUS

## 2016-04-14 MED ORDER — HYDROMORPHONE HCL 1 MG/ML IJ SOLN
INTRAMUSCULAR | Status: AC
  Administered 2016-04-14: 0.5 mg via INTRAVENOUS
  Filled 2016-04-14: qty 1

## 2016-04-14 MED ORDER — HYDROCHLOROTHIAZIDE 12.5 MG PO CAPS
12.5000 mg | ORAL_CAPSULE | Freq: Every day | ORAL | Status: DC
  Administered 2016-04-15 – 2016-04-17 (×3): 12.5 mg via ORAL
  Filled 2016-04-14 (×3): qty 1

## 2016-04-14 MED ORDER — PHENYLEPHRINE 40 MCG/ML (10ML) SYRINGE FOR IV PUSH (FOR BLOOD PRESSURE SUPPORT)
PREFILLED_SYRINGE | INTRAVENOUS | Status: AC
  Filled 2016-04-14: qty 10

## 2016-04-14 MED ORDER — ACETAMINOPHEN 325 MG PO TABS
650.0000 mg | ORAL_TABLET | Freq: Once | ORAL | Status: AC
  Administered 2016-04-14: 650 mg via ORAL
  Filled 2016-04-14: qty 2

## 2016-04-14 MED ORDER — SODIUM CHLORIDE 0.9 % IR SOLN
Status: DC | PRN
  Administered 2016-04-14: 6000 mL

## 2016-04-14 SURGICAL SUPPLY — 56 items
BAG SPEC THK2 15X12 ZIP CLS (MISCELLANEOUS)
BAG ZIPLOCK 12X15 (MISCELLANEOUS) ×1 IMPLANT
BANDAGE ACE 6X5 VEL STRL LF (GAUZE/BANDAGES/DRESSINGS) ×1 IMPLANT
BANDAGE ESMARK 6X9 LF (GAUZE/BANDAGES/DRESSINGS) ×1 IMPLANT
BNDG CMPR 9X6 STRL LF SNTH (GAUZE/BANDAGES/DRESSINGS)
BNDG CMPR MED 15X6 ELC VLCR LF (GAUZE/BANDAGES/DRESSINGS) ×1
BNDG ELASTIC 6X15 VLCR STRL LF (GAUZE/BANDAGES/DRESSINGS) ×1 IMPLANT
BNDG ESMARK 6X9 LF (GAUZE/BANDAGES/DRESSINGS)
CUFF TOURN SGL QUICK 34 (TOURNIQUET CUFF) ×2
CUFF TRNQT CYL 34X4X40X1 (TOURNIQUET CUFF) ×1 IMPLANT
DRAPE EXTREMITY T 121X128X90 (DRAPE) ×2 IMPLANT
DRSG ADAPTIC 3X8 NADH LF (GAUZE/BANDAGES/DRESSINGS) ×1 IMPLANT
DRSG AQUACEL AG ADV 3.5X10 (GAUZE/BANDAGES/DRESSINGS) ×1 IMPLANT
DRSG PAD ABDOMINAL 8X10 ST (GAUZE/BANDAGES/DRESSINGS) ×1 IMPLANT
DRSG TEGADERM 4X4.75 (GAUZE/BANDAGES/DRESSINGS) ×1 IMPLANT
DURAPREP 26ML APPLICATOR (WOUND CARE) ×2 IMPLANT
ELECT REM PT RETURN 9FT ADLT (ELECTROSURGICAL) ×2
ELECTRODE REM PT RTRN 9FT ADLT (ELECTROSURGICAL) ×1 IMPLANT
EVACUATOR 1/8 PVC DRAIN (DRAIN) ×2 IMPLANT
GAUZE SPONGE 2X2 8PLY STRL LF (GAUZE/BANDAGES/DRESSINGS) ×1 IMPLANT
GAUZE SPONGE 4X4 12PLY STRL (GAUZE/BANDAGES/DRESSINGS) ×2 IMPLANT
GAUZE XEROFORM 1X8 LF (GAUZE/BANDAGES/DRESSINGS) ×1 IMPLANT
GLOVE BIOGEL M 7.0 STRL (GLOVE) IMPLANT
GLOVE BIOGEL PI IND STRL 7.5 (GLOVE) ×1 IMPLANT
GLOVE BIOGEL PI IND STRL 8.5 (GLOVE) ×1 IMPLANT
GLOVE BIOGEL PI INDICATOR 7.5 (GLOVE) ×1
GLOVE BIOGEL PI INDICATOR 8.5 (GLOVE) ×1
GLOVE ECLIPSE 8.0 STRL XLNG CF (GLOVE) IMPLANT
GLOVE ORTHO TXT STRL SZ7.5 (GLOVE) ×4 IMPLANT
GLOVE SURG ORTHO 8.0 STRL STRW (GLOVE) ×2 IMPLANT
GOWN STRL REUS W/TWL LRG LVL3 (GOWN DISPOSABLE) ×2 IMPLANT
GOWN STRL REUS W/TWL XL LVL3 (GOWN DISPOSABLE) ×4 IMPLANT
HANDPIECE INTERPULSE COAX TIP (DISPOSABLE) ×2
KIT BASIN OR (CUSTOM PROCEDURE TRAY) ×2 IMPLANT
LIQUID BAND (GAUZE/BANDAGES/DRESSINGS) ×1 IMPLANT
MANIFOLD NEPTUNE II (INSTRUMENTS) ×2 IMPLANT
PACK TOTAL JOINT (CUSTOM PROCEDURE TRAY) ×2 IMPLANT
PAD ABD 8X10 STRL (GAUZE/BANDAGES/DRESSINGS) ×1 IMPLANT
PADDING CAST ABS 6INX4YD NS (CAST SUPPLIES) ×1
PADDING CAST ABS COTTON 6X4 NS (CAST SUPPLIES) IMPLANT
PADDING CAST COTTON 6X4 STRL (CAST SUPPLIES) ×1 IMPLANT
POSITIONER SURGICAL ARM (MISCELLANEOUS) ×2 IMPLANT
SET HNDPC FAN SPRY TIP SCT (DISPOSABLE) ×1 IMPLANT
SPONGE GAUZE 2X2 STER 10/PKG (GAUZE/BANDAGES/DRESSINGS)
STAPLER VISISTAT (STAPLE) ×1 IMPLANT
STAPLER VISISTAT 35W (STAPLE) ×1 IMPLANT
SUT MNCRL AB 4-0 PS2 18 (SUTURE) ×2 IMPLANT
SUT PDS AB 1 CT1 27 (SUTURE) ×1 IMPLANT
SUT VIC AB 1 CT1 36 (SUTURE) ×4 IMPLANT
SUT VIC AB 2-0 CT1 27 (SUTURE) ×6
SUT VIC AB 2-0 CT1 TAPERPNT 27 (SUTURE) ×3 IMPLANT
SUT VLOC 180 0 24IN GS25 (SUTURE) ×2 IMPLANT
SWAB COLLECTION DEVICE MRSA (MISCELLANEOUS) ×2 IMPLANT
SWAB CULTURE ESWAB REG 1ML (MISCELLANEOUS) ×2 IMPLANT
TOWEL OR 17X26 10 PK STRL BLUE (TOWEL DISPOSABLE) ×4 IMPLANT
WRAP KNEE MAXI GEL POST OP (GAUZE/BANDAGES/DRESSINGS) ×1 IMPLANT

## 2016-04-14 NOTE — Anesthesia Postprocedure Evaluation (Signed)
Anesthesia Post Note  Patient: Samuel Henderson  Procedure(s) Performed: Procedure(s) (LRB): IRRIGATION AND DEBRIDEMENT RIGHT KNEE, ARTHROTOMY (Right)  Patient location during evaluation: PACU Anesthesia Type: General Level of consciousness: awake and alert Pain management: pain level controlled Vital Signs Assessment: post-procedure vital signs reviewed and stable Respiratory status: spontaneous breathing, nonlabored ventilation, respiratory function stable and patient connected to nasal cannula oxygen Cardiovascular status: blood pressure returned to baseline and stable Postop Assessment: no signs of nausea or vomiting Anesthetic complications: no    Last Vitals:  Vitals:   04/14/16 1930 04/14/16 1945  BP: (!) 144/90   Pulse: 87 91  Resp: 16 13  Temp:      Last Pain:  Vitals:   04/14/16 1945  TempSrc:   PainSc: 6                  Jarely Juncaj,W. EDMOND

## 2016-04-14 NOTE — Transfer of Care (Signed)
Immediate Anesthesia Transfer of Care Note  Patient: Samuel Henderson  Procedure(s) Performed: Procedure(s): IRRIGATION AND DEBRIDEMENT RIGHT KNEE, ARTHROTOMY (Right)  Patient Location: PACU  Anesthesia Type:General  Level of Consciousness: awake, alert , oriented and patient cooperative  Airway & Oxygen Therapy: Patient Spontanous Breathing and Patient connected to face mask oxygen  Post-op Assessment: Report given to RN, Post -op Vital signs reviewed and stable and Patient moving all extremities  Post vital signs: Reviewed and stable  Last Vitals:  Vitals:   04/14/16 1515 04/14/16 1723  BP: 129/78 143/89  Pulse: 98 97  Resp: 13 16  Temp:  36.9 C    Last Pain:  Vitals:   04/14/16 1723  TempSrc: Oral  PainSc:          Complications: No apparent anesthesia complications

## 2016-04-14 NOTE — Progress Notes (Signed)
Pharmacy Antibiotic Note  Samuel Henderson is a 55 y.o. male with PMH of esophageal cancer admitted on 04/14/2016 with septic right knee s/p aspiration in ED and I&D in OR. Pharmacy has been consulted for Vancomycin dosing.  Plan: Vancomycin 750mg  IV q8h. Plan for Vancomycin trough level at steady state. Goal trough level 15-20 mcg/mL. Monitor renal function, cultures, clinical course. F/u ID recs.  Height: 5\' 9"  (175.3 cm) Weight: 142 lb 4.8 oz (64.5 kg) IBW/kg (Calculated) : 70.7  Temp (24hrs), Avg:99.3 F (37.4 C), Min:97.9 F (36.6 C), Max:101.1 F (38.4 C)   Recent Labs Lab 04/11/16 0935 04/11/16 0935 04/14/16 1102 04/14/16 1311  WBC 19.7*  --  19.5*  --   CREATININE  --  0.9 0.81  --   LATICACIDVEN  --   --   --  0.96    Estimated Creatinine Clearance: 92.9 mL/min (by C-G formula based on SCr of 0.81 mg/dL).    No Known Allergies  Antimicrobials this admission: 8/11 >> Zosyn x 1 8/11 >> Vancomycin >>  Dose adjustments this admission: --  Microbiology results: 8/11 BCx: sent 8/11 UCx: sent  8/11 GS from R knee joint fluid: abundant WBC, no organisms 8/11 aerobic/anaerobic cx from R knee (from OR): sent  Thank you for allowing pharmacy to be a part of this patient's care.   Lindell Spar, PharmD, BCPS Pager: [phone] 04/14/2016 9:23 PM

## 2016-04-14 NOTE — ED Notes (Addendum)
Report given to RN on 3W  

## 2016-04-14 NOTE — ED Notes (Signed)
RN to access port

## 2016-04-14 NOTE — Anesthesia Procedure Notes (Signed)
Procedure Name: LMA Insertion Date/Time: 04/14/2016 6:16 PM Performed by: Carleene Cooper A Pre-anesthesia Checklist: Patient identified, Timeout performed, Emergency Drugs available, Suction available and Patient being monitored Patient Re-evaluated:Patient Re-evaluated prior to inductionOxygen Delivery Method: Circle system utilized Preoxygenation: Pre-oxygenation with 100% oxygen Intubation Type: IV induction LMA: LMA with gastric port inserted LMA Size: 4.0 Number of attempts: 1 Placement Confirmation: positive ETCO2 and breath sounds checked- equal and bilateral Tube secured with: Tape Dental Injury: Teeth and Oropharynx as per pre-operative assessment

## 2016-04-14 NOTE — ED Triage Notes (Addendum)
Pt complaint of lower back pain and bilateral knee pain onset yesterday. Last chemo 3 weeks ago.

## 2016-04-14 NOTE — ED Provider Notes (Signed)
[facility] DEPT Provider Note   CSN: [id number] Arrival date & time: 04/14/16  1950  First Provider Contact:  None       History   Chief Complaint Chief Complaint  Patient presents with  . Other    Generalized Pain    HPI Samuel Henderson is a 55 y.o. male.  55 year old male with history of metastatic esophageal cancer presents complaining of bilateral knee pain 1 week. Pain is worse at the right knee and is and atraumatic. Denies any fever or chills. Pain is sharp and worse with standing. Has also had some lower back discomfort without urinary symptoms and does have a history of metastatic lesion to that area. His CT scan results were reviewed from 3 days ago. Which didn't show this. Patient has used over-the-counter medications without relief. Denies any bowel or bladder dysfunction.      Past Medical History:  Diagnosis Date  . Arthritis   . Esophageal cancer ([place])   . Hypertension   . Knee pain     Patient Active Problem List   Diagnosis Date Noted  . Port catheter in place 02/08/2016  . Bone metastases ([place]) 02/08/2016  . Dehydration 11/16/2015  . URI (upper respiratory infection) 11/16/2015  . Liver metastases ([place]) 10/27/2015  . Hypokalemia 10/16/2015  . Squamous cell cancer of lower third of esophagus ([place]) 09/15/2015  . Primary esophageal squamous cell carcinoma ([place]) 09/15/2015  . Dysphagia 09/15/2015  . HTN (hypertension) 09/02/2015    Past Surgical History:  Procedure Laterality Date  . ESOPHAGOGASTRODUODENOSCOPY (EGD) WITH ESOPHAGEAL DILATION     and biopsy  . KNEE SURGERY    . LESION REMOVAL  1969   hip       Home Medications    Prior to Admission medications   Medication Sig Start Date End Date Taking? Authorizing Provider  amLODipine (NORVASC) 5 MG tablet Take 5 mg by mouth daily.    Historical Provider, MD  dexamethasone (DECADRON) 4 MG tablet Take 2 tablets (8 mg total) by mouth daily. Start the day after chemotherapy for 4  days. 02/18/16   Brunetta Genera, MD  hydrocortisone (CORTEF) 20 MG tablet  11/03/15   Historical Provider, MD  lidocaine-prilocaine (EMLA) cream Apply 1 application topically once. 12/07/15 12/06/16  Brunetta Genera, MD  lisinopril-hydrochlorothiazide (PRINZIDE,ZESTORETIC) 10-12.5 MG tablet  12/06/15   Historical Provider, MD  LORazepam (ATIVAN) 0.5 MG tablet TAKE 1 TABLET BY MOUTH EVERY 6 HOURS AS NEEDED FOR NAUSEA AND VOMITING 03/20/16   Brunetta Genera, MD  meloxicam (MOBIC) 15 MG tablet Take 15 mg by mouth daily.    Historical Provider, MD  omeprazole (PRILOSEC) 20 MG capsule TAKE ONE CAPSULE BY MOUTH TWICE DAILY BEFORE A MEAL 02/17/16   Brunetta Genera, MD  ondansetron (ZOFRAN) 8 MG tablet Take 1 tablet (8 mg total) by mouth every 8 (eight) hours as needed for nausea. 02/18/16   Brunetta Genera, MD  oxyCODONE (ROXICODONE) 5 MG/5ML solution Take 5-10 mLs (5-10 mg total) by mouth every 4 (four) hours as needed for moderate pain or severe pain. 02/08/16   Brunetta Genera, MD  prochlorperazine (COMPAZINE) 10 MG tablet Take 1 tablet (10 mg total) by mouth every 6 (six) hours as needed (Nausea or vomiting). 02/18/16   Brunetta Genera, MD  VIAGRA 100 MG tablet Reported on 10/22/2015 09/10/15   Historical Provider, MD    Family History Family History  Problem Relation Age of Onset  . Esophageal cancer Father   .  Cancer Father     nasopharyngeal   . Diabetes Sister   . Cancer Sister     multiple myeloma  . Colon cancer Neg Hx     Social History Social History  Substance Use Topics  . Smoking status: Current Every Day Smoker    Packs/day: 0.25    Years: 20.00    Types: Cigarettes  . Smokeless tobacco: Never Used  . Alcohol use No     Allergies   Review of patient's allergies indicates no known allergies.   Review of Systems Review of Systems  All other systems reviewed and are negative.    Physical Exam Updated Vital Signs BP 107/75 (BP Location: Right Arm)    Pulse 118   Temp 98.9 F (37.2 C) (Oral)   Resp 18   Ht 5' 9"  (1.753 m)   Wt 59.4 kg   SpO2 100%   BMI 19.35 kg/m   Physical Exam  Constitutional: He is oriented to person, place, and time. He appears cachectic.  Non-toxic appearance. No distress.  HENT:  Head: Normocephalic and atraumatic.  Eyes: Conjunctivae, EOM and lids are normal. Pupils are equal, round, and reactive to light.  Neck: Normal range of motion. Neck supple. No tracheal deviation present. No thyroid mass present.  Cardiovascular: Normal rate, regular rhythm and normal heart sounds.  Exam reveals no gallop.   No murmur heard. Pulmonary/Chest: Effort normal and breath sounds normal. No stridor. No respiratory distress. He has no decreased breath sounds. He has no wheezes. He has no rhonchi. He has no rales.  Abdominal: Soft. Normal appearance and bowel sounds are normal. He exhibits no distension. There is no tenderness. There is no rebound and no CVA tenderness.  Musculoskeletal: Normal range of motion. He exhibits no edema or tenderness.       Back:       Legs: Neurological: He is alert and oriented to person, place, and time. He has normal strength. No cranial nerve deficit or sensory deficit. GCS eye subscore is 4. GCS verbal subscore is 5. GCS motor subscore is 6.  Skin: Skin is warm and dry. No abrasion and no rash noted.  Psychiatric: He has a normal mood and affect. His speech is normal and behavior is normal.  Nursing note and vitals reviewed.    ED Treatments / Results  Labs (all labs ordered are listed, but only abnormal results are displayed) Labs Reviewed  URINE CULTURE  COMPREHENSIVE METABOLIC PANEL  URINALYSIS, ROUTINE W REFLEX MICROSCOPIC (NOT AT [facility])  CBC WITH DIFFERENTIAL/PLATELET    EKG  EKG Interpretation None       Radiology No results found.  Procedures .Joint Aspiration/Arthrocentesis Date/Time: 04/14/2016 3:33 PM Performed by: Lacretia Leigh Authorized by: Lacretia Leigh     Consent:    Consent obtained:  Verbal   Consent given by:  Patient   Risks discussed:  Infection   Alternatives discussed:  No treatment Location:    Location:  Knee   Knee:  R knee Anesthesia (see MAR for exact dosages):    Anesthesia method:  Local infiltration   Local anesthetic:  Lidocaine 2% WITH epi Procedure details:    Preparation: Patient was prepped and draped in usual sterile fashion     Needle gauge:  20 G   Ultrasound guidance: no     Approach:  Medial   Aspirate amount:  20 mL   Aspirate characteristics:  Purulent   Steroid injected: no     Specimen collected: yes  Post-procedure details:    Dressing:  Sterile dressing   Patient tolerance of procedure:  Tolerated well, no immediate complications   (including critical care time)  Medications Ordered in ED Medications  HYDROmorphone (DILAUDID) injection 0.5 mg (not administered)     Initial Impression / Assessment and Plan / ED Course  I have reviewed the triage vital signs and the nursing notes.  Pertinent labs & imaging results that were available during my care of the patient were reviewed by me and considered in my medical decision making (see chart for details).  Clinical Course   Joint aspiration performed. Fluid sent for analysis. Patient started on IV antibiotics. Discussed orthopedic surgeon on call, Dr. Alvan Dame, will see in the ED.  Final Clinical Impressions(s) / ED Diagnoses   Final diagnoses:  None    New Prescriptions New Prescriptions   No medications on file     Lacretia Leigh, MD 04/14/16 1603

## 2016-04-14 NOTE — Progress Notes (Signed)
[name] Kitchen    HEMATOLOGY/ONCOLOGY CLINIC NOTE  Date of Service: .04/11/2016    Patient Care Team: Leanna Battles, MD as PCP - General (Internal Medicine)  CHIEF COMPLAINTS/PURPOSE OF CONSULTATION:  Metastatic Esophageal Squamous cell carcinoma   HISTORY OF PRESENTING ILLNESS: plz see my initial consultation regarding patients initial presentation  INTERVAL HISTORY  Samuel Henderson is here for follow-up  His scheduled followup with re-staging CT and bone scan. Most disease is stable. Has skeletal mets which are currently asymptomatic. No SOB. No abdominal pain. Grade 2 fatigue from chemotherapy. Grade 1 neuropathy stable/improving. No other acute new symptoms. Mother present for this clinic visit.  MEDICAL HISTORY:  Past Medical History:  Diagnosis Date  . Arthritis   . Esophageal cancer ([place])   . Hypertension   . Knee pain     SURGICAL HISTORY: Past Surgical History:  Procedure Laterality Date  . ESOPHAGOGASTRODUODENOSCOPY (EGD) WITH ESOPHAGEAL DILATION     and biopsy  . KNEE SURGERY    . LESION REMOVAL  1969   hip    SOCIAL HISTORY: Social History   Social History  . Marital status: Single    Spouse name: N/A  . Number of children: 2  . Years of education: N/A   Occupational History  . Manufacturing industrial curtains    Social History Main Topics  . Smoking status: Current Every Day Smoker    Packs/day: 0.25    Years: 20.00    Types: Cigarettes  . Smokeless tobacco: Never Used  . Alcohol use No  . Drug use:     Types: Marijuana     Comment: last smoked 1 week ago  . Sexual activity: Yes   Other Topics Concern  . 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 Sober for 15 yrs  Ex smoker 1/2 PPD x 57yr quit  about 18 yrs ago.  FAMILY HISTORY: Family History  Problem Relation Age of Onset  . Esophageal cancer Father   . Cancer Father     nasopharyngeal   . Diabetes Sister   . Cancer Sister     multiple myeloma  . Colon cancer Neg Hx     ALLERGIES:  has No Known Allergies.  MEDICATIONS:  No current facility-administered medications for this visit.    No current outpatient prescriptions on file.   Facility-Administered Medications Ordered in Other Visits  Medication Dose Route Frequency Provider Last Rate Last Dose  . 0.9 %  sodium chloride infusion   Intravenous Continuous MParalee Cancel MD 50 mL/hr at 04/14/16 1805    . acetaminophen (TYLENOL) tablet 650 mg  650 mg Oral Q6H PRN MParalee Cancel MD       Or  . acetaminophen (TYLENOL) suppository 650 mg  650 mg Rectal Q6H PRN MParalee Cancel MD      . [Derrill MemoON 04/15/2016] amLODipine (NORVASC) tablet 5 mg  5 mg Oral Daily MParalee Cancel MD      . docusate sodium (COLACE) capsule 100 mg  100 mg Oral BID MParalee Cancel MD      . [Derrill MemoON 04/15/2016] enoxaparin (LOVENOX) injection 40 mg  40 mg Subcutaneous Q24H MParalee Cancel MD      . [Derrill Memo  ON 04/15/2016] lisinopril (PRINIVIL,ZESTRIL) tablet 10 mg  10 mg Oral Daily Paralee Cancel, MD       And  . Derrill Memo ON 04/15/2016] hydrochlorothiazide (MICROZIDE) capsule 12.5 mg  12.5 mg Oral Daily Paralee Cancel, MD      . HYDROcodone-acetaminophen (NORCO/VICODIN) 5-325 MG per tablet 1-2 tablet  1-2 tablet Oral Q4H PRN Paralee Cancel, MD      . hydrocortisone (CORTEF) tablet 20 mg  20 mg Oral BID Paralee Cancel, MD      . HYDROmorphone (DILAUDID) injection 0.5-1 mg  0.5-1 mg Intravenous Q3H PRN Paralee Cancel, MD      . LORazepam (ATIVAN) tablet 0.5 mg  0.5 mg Oral Q6H PRN Paralee Cancel, MD      . methocarbamol (ROBAXIN) tablet 500 mg  500 mg Oral Q6H PRN Paralee Cancel, MD       Or  . methocarbamol (ROBAXIN) 500 mg in dextrose 5 % 50 mL IVPB  500 mg Intravenous Q6H PRN Paralee Cancel, MD      . metoCLOPramide (REGLAN)  tablet 5-10 mg  5-10 mg Oral Q8H PRN Paralee Cancel, MD       Or  . metoCLOPramide (REGLAN) injection 5-10 mg  5-10 mg Intravenous Q8H PRN Paralee Cancel, MD      . ondansetron [facility]) tablet 4 mg  4 mg Oral Q6H PRN Paralee Cancel, MD       Or  . ondansetron [facility]) injection 4 mg  4 mg Intravenous Q6H PRN Paralee Cancel, MD      . oxyCODONE (ROXICODONE) 5 MG/5ML solution 5-10 mg  5-10 mg Oral Q4H PRN Paralee Cancel, MD      . Derrill Memo ON 04/15/2016] pantoprazole (PROTONIX) EC tablet 40 mg  40 mg Oral Daily Paralee Cancel, MD      . polyethylene glycol (MIRALAX / GLYCOLAX) packet 17 g  17 g Oral Daily PRN Paralee Cancel, MD      . prochlorperazine (COMPAZINE) tablet 10 mg  10 mg Oral Q6H PRN Paralee Cancel, MD      . vancomycin (VANCOCIN) IVPB 750 mg/150 ml premix  750 mg Intravenous Q8H Luiz Ochoa, RPH        REVIEW OF SYSTEMS:    10 Point review of Systems was done is negative except as noted above.  PHYSICAL EXAMINATION: ECOG PERFORMANCE STATUS: 1 - Symptomatic but completely ambulatory  . Vitals:   04/11/16 1019  BP: 125/85  Pulse: 88  Resp: 18  Temp: 98.8 F (37.1 C)   Filed Weights   04/11/16 1019  Weight: 131 lb 14.4 oz (59.8 kg)   .Body mass index is 19.48 kg/m.  . Wt Readings from Last 3 Encounters:  04/14/16 142 lb 4.8 oz (64.5 kg)  04/11/16 131 lb 14.4 oz (59.8 kg)  03/21/16 133 lb 8 oz (60.6 kg)   GENERAL:middle aged AAM ,alert, in no acute distress and comfortable SKIN: skin color, texture, turgor are normal, no rashes or significant lesions EYES: normal, conjunctiva are pink and non-injected, sclera clear OROPHARYNX:no exudate, no erythema and lips, buccal mucosa, and tongue normal  NECK: supple, no JVD, thyroid normal size, non-tender, without nodularity LYMPH:  no palpable lymphadenopathy in the cervical, axillary or inguinal LUNGS: clear to auscultation with normal respiratory effort HEART: regular rate & rhythm,  no murmurs and no lower extremity edema ABDOMEN:  abdomen soft, non-tender, normoactive bowel sounds  Musculoskeletal: no cyanosis of digits and no clubbing  PSYCH: alert & oriented x 3 with fluent speech NEURO: no  focal motor/sensory deficits  LABORATORY DATA:  I have reviewed the data as listed  . CBC Latest Ref Rng & Units 04/14/2016 04/11/2016 03/21/2016  WBC 4.0 - 10.5 K/uL 19.5(H) 19.7(H) 14.8(H)  Hemoglobin 13.0 - 17.0 g/dL 11.6(L) 12.3(L) 11.4(L)  Hematocrit 39.0 - 52.0 % 33.8(L) 36.1(L) 33.7(L)  Platelets 150 - 400 K/uL 281 277 278   . CBC    Component Value Date/Time   WBC 19.5 (H) 04/14/2016 1102   RBC 3.25 (L) 04/14/2016 1102   HGB 11.6 (L) 04/14/2016 1102   HGB 12.3 (L) 04/11/2016 0935   HCT 33.8 (L) 04/14/2016 1102   HCT 36.1 (L) 04/11/2016 0935   PLT 281 04/14/2016 1102   PLT 277 04/11/2016 0935   MCV 104.0 (H) 04/14/2016 1102   MCV 104.9 (H) 04/11/2016 0935   MCH 35.7 (H) 04/14/2016 1102   MCHC 34.3 04/14/2016 1102   RDW 16.7 (H) 04/14/2016 1102   RDW 17.7 (H) 04/11/2016 0935   LYMPHSABS 0.9 04/14/2016 1102   LYMPHSABS 1.0 04/11/2016 0935   MONOABS 2.3 (H) 04/14/2016 1102   MONOABS 0.5 04/11/2016 0935   EOSABS 0.0 04/14/2016 1102   EOSABS 0.0 04/11/2016 0935   BASOSABS 0.0 04/14/2016 1102   BASOSABS 0.0 04/11/2016 0935    CMP Latest Ref Rng & Units 04/14/2016 04/11/2016 03/21/2016  Glucose 65 - 99 mg/dL 87 134 147(H)  BUN 6 - 20 mg/dL 18 15.7 19.4  Creatinine 0.61 - 1.24 mg/dL 0.81 0.9 0.8  Sodium 135 - 145 mmol/L 131(L) 138 137  Potassium 3.5 - 5.1 mmol/L 3.4(L) 4.0 3.9  Chloride 101 - 111 mmol/L 92(L) - -  CO2 22 - 32 mmol/L 27 26 24   Calcium 8.9 - 10.3 mg/dL 8.5(L) 9.9 9.0  Total Protein 6.5 - 8.1 g/dL 7.8 7.7 7.1  Total Bilirubin 0.3 - 1.2 mg/dL 1.5(H) 0.39 0.34  Alkaline Phos 38 - 126 U/L 78 111 102  AST 15 - 41 U/L 24 21 19   ALT 17 - 63 U/L 23 24 24     RADIOGRAPHIC STUDIES: I have personally reviewed the radiological images as listed and agreed with the findings in the report. Dg Chest 2  View  Result Date: 04/14/2016 CLINICAL DATA:  55 year old male with a history of metastatic esophageal carcinoma. Worsening low back pain. EXAM: CHEST  2 VIEW COMPARISON:  Chest CT 04/10/2016 FINDINGS: Cardiomediastinal silhouette within normal limits in size and contour. Right port catheter via IJ approach with the tip appearing to terminate superior vena cava. No confluent airspace disease, pneumothorax, or pleural effusion. No displaced fracture identified. Vertebral body heights maintained on the lateral view. IMPRESSION: No radiographic evidence of acute cardiopulmonary disease. Right-sided port catheter. Signed, Dulcy Fanny. Earleen Newport, DO [facility] Electronically Signed   By: Corrie Mckusick D.O.   On: 04/14/2016 09:59   Ct Chest W Contrast  Result Date: 04/10/2016 CLINICAL DATA:  Esophageal cancer with liver metastases, radiation therapy and chemotherapy. Upper abdominal pain for 4 weeks. EXAM: CT CHEST, ABDOMEN, AND PELVIS WITH CONTRAST TECHNIQUE: Multidetector CT imaging of the chest, abdomen and pelvis was performed following the standard protocol during bolus administration of intravenous contrast. CONTRAST:  129m ISOVUE-300 IOPAMIDOL (ISOVUE-300) INJECTION 61% COMPARISON:  Bone scan 04/06/2016 and CT chest abdomen pelvis 02/03/2016. PET 09/29/2015 FINDINGS: CT CHEST FINDINGS Cardiovascular: Right IJ Port-A-Cath terminates in the low SVC. Atherosclerotic calcification of the arterial vasculature, including coronary arteries. Heart size normal. Small amount of pericardial fluid appears stable and is likely  physiologic. Mediastinum/Nodes: Lower right internal jugular lymph node measures 7 mm, stable. No pathologically enlarged mediastinal, hilar or axillary lymph nodes. Esophagus is grossly unremarkable. Lungs/Pleura: Lungs are clear. No pleural fluid. Airway is unremarkable. Musculoskeletal: A lytic lesion is again seen in the T1 vertebral body.  Healing fracture of the right first rib is seen. Old left rib fractures. CT ABDOMEN PELVIS FINDINGS Hepatobiliary: There are poorly defined heterogeneous lesions in the liver, measuring up to approximately 2.3 cm in the dome (series 2, image 52), likely stable when remeasured on the prior exam. A faint lesion in segment 4 (series 2, image 54) may be minimally smaller, measuring 1.8 cm, previously 2.4 cm. Gallbladder is unremarkable. No biliary ductal dilatation. Pancreas: Negative. Spleen: Low-attenuation lesion in the spleen measures 2.2 cm, stable. Adrenals/Urinary Tract: Adrenal glands are unremarkable. Sub cm low-attenuation lesion in the right kidney is most likely a cyst. Kidneys are otherwise unremarkable. Ureters are decompressed. Bladder is grossly unremarkable. Stomach/Bowel: Stomach, small bowel, appendix and colon are unremarkable. Vascular/Lymphatic: Atherosclerotic calcification of the arterial vasculature without abdominal aortic aneurysm. Gastrohepatic ligament adenopathy measures 1.5 x 1.9 cm, previously 1.6 x 2.8 cm. Reproductive: Prostate is visualized. Other: No free fluid.  Mesenteries and peritoneum are unremarkable. Musculoskeletal: Ill-defined sclerosis is seen in the superior endplate of L3 and may correspond to abnormal radiotracer accumulation on 04/06/2016. There may be a new subtle area of lucency in the lateral aspect of the right sacrum (series 2, image 100). There is degenerative disc disease from L3-4 to L5-S1. Minimal retrolisthesis of L4 on L5, as before. IMPRESSION: 1. Hepatic metastatic disease is stable to minimally improved. 2. Gastrohepatic ligament adenopathy, decreased in size. 3. T1 metastasis, as before. Additional lesions in the L3 vertebral body and right sacrum, corresponding to abnormalities on 04/06/2016 and worrisome for metastatic disease. 4. Aortic atherosclerosis, coronary artery calcification. Electronically Signed   By: Lorin Picket M.D.   On: 04/10/2016  09:40   Nm Bone Scan Whole Body  Result Date: 04/06/2016 CLINICAL DATA:  Esophageal cancer at lower third of esophagus with liver and bone metastases EXAM: NUCLEAR MEDICINE WHOLE BODY BONE SCAN TECHNIQUE: Whole body anterior and posterior images were obtained approximately 3 hours after intravenous injection of radiopharmaceutical. RADIOPHARMACEUTICALS:  25.7 mCi Technetium-54mMDP IV COMPARISON:  PET-CT 09/29/2015, CT chest abdomen pelvis 02/03/2016 FINDINGS: Minimal uptake at the shoulders, LEFT hand, and both knees, typically degenerative. Uptake in upper thoracic spine at approximately T1 and in the lumbar spine at L3, corresponding to osseous changes identified on prior PET-CT question metastases. Increased uptake LEFT lateral aspect of L5, with degenerative changes present on CT at this level. Focal increased uptake RIGHT lateral aspect of upper sacrum without CT correlate, cannot exclude metastasis. Increased uptake lateral RIGHT first rib corresponding to a healing fracture on CT. Subtle focus of increased trace localization within mid RIGHT femoral diaphysis, question metastasis No additional abnormal sites of osseous tracer accumulation identified. Expected urinary tract and soft tissue distribution of tracer. IMPRESSION: Increased uptake in T1 vertebral body, L3 vertebral body, RIGHT femoral diaphysis, and RIGHT upper sacrum question osseous metastases. Radiographic correlation of the RIGHT femur is recommended. Increased uptake LEFT lateral L5, with degenerative disc and facet disease changes present at this site on prior CT. Increased uptake lateral RIGHT first rib corresponding to healing fracture on CT. Electronically Signed   By: MLavonia DanaM.D.   On: 04/06/2016 13:12   Ct Abdomen Pelvis W Contrast  Result Date: 04/10/2016 CLINICAL DATA:  Esophageal  cancer with liver metastases, radiation therapy and chemotherapy. Upper abdominal pain for 4 weeks. EXAM: CT CHEST, ABDOMEN, AND PELVIS WITH  CONTRAST TECHNIQUE: Multidetector CT imaging of the chest, abdomen and pelvis was performed following the standard protocol during bolus administration of intravenous contrast. CONTRAST:  125m ISOVUE-300 IOPAMIDOL (ISOVUE-300) INJECTION 61% COMPARISON:  Bone scan 04/06/2016 and CT chest abdomen pelvis 02/03/2016. PET 09/29/2015 FINDINGS: CT CHEST FINDINGS Cardiovascular: Right IJ Port-A-Cath terminates in the low SVC. Atherosclerotic calcification of the arterial vasculature, including coronary arteries. Heart size normal. Small amount of pericardial fluid appears stable and is likely physiologic. Mediastinum/Nodes: Lower right internal jugular lymph node measures 7 mm, stable. No pathologically enlarged mediastinal, hilar or axillary lymph nodes. Esophagus is grossly unremarkable. Lungs/Pleura: Lungs are clear. No pleural fluid. Airway is unremarkable. Musculoskeletal: A lytic lesion is again seen in the T1 vertebral body. Healing fracture of the right first rib is seen. Old left rib fractures. CT ABDOMEN PELVIS FINDINGS Hepatobiliary: There are poorly defined heterogeneous lesions in the liver, measuring up to approximately 2.3 cm in the dome (series 2, image 52), likely stable when remeasured on the prior exam. A faint lesion in segment 4 (series 2, image 54) may be minimally smaller, measuring 1.8 cm, previously 2.4 cm. Gallbladder is unremarkable. No biliary ductal dilatation. Pancreas: Negative. Spleen: Low-attenuation lesion in the spleen measures 2.2 cm, stable. Adrenals/Urinary Tract: Adrenal glands are unremarkable. Sub cm low-attenuation lesion in the right kidney is most likely a cyst. Kidneys are otherwise unremarkable. Ureters are decompressed. Bladder is grossly unremarkable. Stomach/Bowel: Stomach, small bowel, appendix and colon are unremarkable. Vascular/Lymphatic: Atherosclerotic calcification of the arterial vasculature without abdominal aortic aneurysm. Gastrohepatic ligament adenopathy  measures 1.5 x 1.9 cm, previously 1.6 x 2.8 cm. Reproductive: Prostate is visualized. Other: No free fluid.  Mesenteries and peritoneum are unremarkable. Musculoskeletal: Ill-defined sclerosis is seen in the superior endplate of L3 and may correspond to abnormal radiotracer accumulation on 04/06/2016. There may be a new subtle area of lucency in the lateral aspect of the right sacrum (series 2, image 100). There is degenerative disc disease from L3-4 to L5-S1. Minimal retrolisthesis of L4 on L5, as before. IMPRESSION: 1. Hepatic metastatic disease is stable to minimally improved. 2. Gastrohepatic ligament adenopathy, decreased in size. 3. T1 metastasis, as before. Additional lesions in the L3 vertebral body and right sacrum, corresponding to abnormalities on 04/06/2016 and worrisome for metastatic disease. 4. Aortic atherosclerosis, coronary artery calcification. Electronically Signed   By: MLorin PicketM.D.   On: 04/10/2016 09:40   Dg Knee Complete 4 Views Left  Result Date: 04/14/2016 CLINICAL DATA:  Bilateral knee pain EXAM: LEFT KNEE - COMPLETE 4+ VIEW COMPARISON:  None. FINDINGS: No acute fracture or dislocation is noted. Degenerative changes are noted with osteophytic formation. A large joint effusion is noted IMPRESSION: Degenerative change. Large joint effusion. Electronically Signed   By: MInez CatalinaM.D.   On: 04/14/2016 10:30   Dg Knee Complete 4 Views Right  Result Date: 04/14/2016 CLINICAL DATA:  55year old male with a history of metastatic esophageal carcinoma with new knee pain EXAM: RIGHT KNEE - COMPLETE 4+ VIEW COMPARISON:  None. FINDINGS: No displaced fracture. No lucent lesion. There is soft tissue swelling in the suprapatellar region, potentially large joint effusion. Extensive vascular calcifications. Advanced tricompartmental osteoarthritis. IMPRESSION: No displaced fracture. No aggressive lucent lesion or sclerotic lesion. Significant suprapatellar soft tissue swelling/ joint  effusion. Correlation with physical exam may be useful. Advanced tricompartmental osteoarthritis. Atherosclerosis of the femoral popliteal  and tibial vasculature. Signed, Dulcy Fanny. Earleen Newport, DO [facility] Electronically Signed   By: Corrie Mckusick D.O.   On: 04/14/2016 10:29    ASSESSMENT & PLAN:   55 yo AAM with ECOG PS of 1 with   1) Metastatic moderately to poorly differentiated 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 S/p palliative radiation therapy to the esophagus and regional lymph nodes. He has tolerated treatment generally well except for some mild grade 2 radiation esophagitis which has now resolved. Repeat CT chest abdomen pelvis done 11/17/2015 show progression of his liver metastases and gastrohepatic lymph node as expected.  He has completed his 6 cycles of carboplatin and Taxol  With grade 1-2 fatigue and grade 1 neuropathy CT c/a/p with stable improved disease with some  T1 metastasis, as before. Additional lesions in the L3 vertebral body and right sacrum, corresponding to abnormalities on 04/06/2016 and worrisome for metastatic disease. 2) HTN- patient notes his appetite has improved and he is eating better. On amlodipine. 3) Dysphagia due to esophageal SCC -mostly resolved 4) grade 2 radiation esophagitis. Resolved . 5) leukocytosis likely due to Neulasta. No evidence of infection at this time. 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 This has improved some and remains nonpainful. Plan -CT and bone scan results discussed with the patient. -patient has no symptomatic disease at this time and we discussed that all treatment will be palliative -we discussed that if there any bone pain could discuss with Dr Tammi Klippel regarding possible palliative RT -we discussed continued palliative  chemorx now vs taking a break in treatment to focus on QOL issues and recovering from fatigue. Patient chooses to take a month off treatment which is quite reasonable. -Continue  on Xgeva for bony metastases. - recommended continued optimization of his nutrition and maintaining good hydration.  All of his questions and concerns were answered in detail.   return to care with Dr. Irene Limbo in4  weeks with repeat labs  I spent 20 minutes counseling the patient face to face. The total time spent in the appointment was 25 minutes and more than 50% was on counseling and direct patient cares.    Samuel Lone MD [place] AAHIVMS [facility] Hematology/Oncology Phys[facility]  (Office):       [phone] (Work cell):  [phone] (Fax):           [phone]

## 2016-04-14 NOTE — Anesthesia Preprocedure Evaluation (Addendum)
Anesthesia Evaluation  Patient identified by MRN, date of birth, ID band Patient awake    Reviewed: Allergy & Precautions, H&P , NPO status , Patient's Chart, lab work & pertinent test results  Airway Mallampati: II  TM Distance: >3 FB Neck ROM: Full    Dental no notable dental hx. (+) Teeth Intact, Dental Advisory Given   Pulmonary Current Smoker,    Pulmonary exam normal breath sounds clear to auscultation       Cardiovascular hypertension, Pt. on medications  Rhythm:Regular Rate:Normal     Neuro/Psych negative neurological ROS  negative psych ROS   GI/Hepatic Neg liver ROS, GERD  Medicated and Controlled,  Endo/Other  negative endocrine ROS  Renal/GU negative Renal ROS  negative genitourinary   Musculoskeletal  (+) Arthritis , Osteoarthritis,    Abdominal   Peds  Hematology negative hematology ROS (+)   Anesthesia Other Findings   Reproductive/Obstetrics negative OB ROS                            Anesthesia Physical Anesthesia Plan  ASA: II  Anesthesia Plan: General   Post-op Pain Management:    Induction: Intravenous  Airway Management Planned: LMA  Additional Equipment:   Intra-op Plan:   Post-operative Plan: Extubation in OR  Informed Consent: I have reviewed the patients History and Physical, chart, labs and discussed the procedure including the risks, benefits and alternatives for the proposed anesthesia with the patient or authorized representative who has indicated his/her understanding and acceptance.   Dental advisory given  Plan Discussed with: CRNA  Anesthesia Plan Comments:       Anesthesia Quick Evaluation

## 2016-04-14 NOTE — H&P (Signed)
Samuel Henderson is an 55 y.o. male.    Chief Complaint:  Painful right knee, fever   HPI: Pt is a 55 y.o. male complaining of right knee pain for past 24 hours.  Pleasant male with recent history of treatment for esophageal cancer presented to ER with recent onset of right knee pain and fever of 103.  No other joint complaints.   Last ate yesterday. No history of gout  PCP:  Donnajean Lopes, MD  D/C Plans: To be determined following appropriate treatment plan  PMH: Past Medical History:  Diagnosis Date  . Arthritis   . Esophageal cancer ([place])   . Hypertension   . Knee pain     PSH: Past Surgical History:  Procedure Laterality Date  . ESOPHAGOGASTRODUODENOSCOPY (EGD) WITH ESOPHAGEAL DILATION     and biopsy  . KNEE SURGERY    . LESION REMOVAL  1969   hip    Social History:  reports that he has been smoking Cigarettes.  He has a 5.00 pack-year smoking history. He has never used smokeless tobacco. He reports that he uses drugs, including Marijuana. He reports that he does not drink alcohol.  Allergies:  No Known Allergies  Medications:  (Not in a hospital admission)  Results for orders placed or performed during the hospital encounter of 04/14/16 (from the past 48 hour(s))  Comprehensive metabolic panel     Status: Abnormal   Collection Time: 04/14/16 11:02 AM  Result Value Ref Range   Sodium 131 (L) 135 - 145 mmol/L   Potassium 3.4 (L) 3.5 - 5.1 mmol/L   Chloride 92 (L) 101 - 111 mmol/L   CO2 27 22 - 32 mmol/L   Glucose, Bld 87 65 - 99 mg/dL   BUN 18 6 - 20 mg/dL   Creatinine, Ser 0.81 0.61 - 1.24 mg/dL   Calcium 8.5 (L) 8.9 - 10.3 mg/dL   Total Protein 7.8 6.5 - 8.1 g/dL   Albumin 4.2 3.5 - 5.0 g/dL   AST 24 15 - 41 U/L   ALT 23 17 - 63 U/L   Alkaline Phosphatase 78 38 - 126 U/L   Total Bilirubin 1.5 (H) 0.3 - 1.2 mg/dL   GFR calc non Af Amer >60 >60 mL/min   GFR calc Af Amer >60 >60 mL/min    Comment: (NOTE) The eGFR has been calculated using the CKD  EPI equation. This calculation has not been validated in all clinical situations. eGFR's persistently <60 mL/min signify possible Chronic Kidney Disease.    Anion gap 12 5 - 15  CBC with Differential/Platelet     Status: Abnormal   Collection Time: 04/14/16 11:02 AM  Result Value Ref Range   WBC 19.5 (H) 4.0 - 10.5 K/uL   RBC 3.25 (L) 4.22 - 5.81 MIL/uL   Hemoglobin 11.6 (L) 13.0 - 17.0 g/dL   HCT 33.8 (L) 39.0 - 52.0 %   MCV 104.0 (H) 78.0 - 100.0 fL   MCH 35.7 (H) 26.0 - 34.0 pg   MCHC 34.3 30.0 - 36.0 g/dL   RDW 16.7 (H) 11.5 - 15.5 %   Platelets 281 150 - 400 K/uL   Neutrophils Relative % 83 %   Neutro Abs 16.2 (H) 1.7 - 7.7 K/uL   Lymphocytes Relative 5 %   Lymphs Abs 0.9 0.7 - 4.0 K/uL   Monocytes Relative 12 %   Monocytes Absolute 2.3 (H) 0.1 - 1.0 K/uL   Eosinophils Relative 0 %   Eosinophils Absolute 0.0 0.0 -  0.7 K/uL   Basophils Relative 0 %   Basophils Absolute 0.0 0.0 - 0.1 K/uL  Sedimentation rate     Status: Abnormal   Collection Time: 04/14/16 11:02 AM  Result Value Ref Range   Sed Rate 121 (H) 0 - 16 mm/hr  Urinalysis, Routine w reflex microscopic (not at [facility])     Status: Abnormal   Collection Time: 04/14/16 12:52 PM  Result Value Ref Range   Color, Urine YELLOW YELLOW   APPearance CLEAR CLEAR   Specific Gravity, Urine 1.018 1.005 - 1.030   pH 7.0 5.0 - 8.0   Glucose, UA NEGATIVE NEGATIVE mg/dL   Hgb urine dipstick TRACE (A) NEGATIVE   Bilirubin Urine NEGATIVE NEGATIVE   Ketones, ur 40 (A) NEGATIVE mg/dL   Protein, ur NEGATIVE NEGATIVE mg/dL   Nitrite NEGATIVE NEGATIVE   Leukocytes, UA NEGATIVE NEGATIVE  Urine microscopic-add on     Status: Abnormal   Collection Time: 04/14/16 12:52 PM  Result Value Ref Range   Squamous Epithelial / LPF 0-5 (A) NONE SEEN   WBC, UA 0-5 0 - 5 WBC/hpf   RBC / HPF 0-5 0 - 5 RBC/hpf   Bacteria, UA RARE (A) NONE SEEN  I-Stat CG4 Lactic Acid, ED     Status: None   Collection Time: 04/14/16  1:11 PM  Result Value Ref  Range   Lactic Acid, Venous 0.96 0.5 - 1.9 mmol/L  Body fluid culture     Status: None (Preliminary result)   Collection Time: 04/14/16  1:15 PM  Result Value Ref Range   Specimen Description SYNOVIAL FLUID RIGHT KNEE    Special Requests Immunocompromised    Gram Stain      WBC PRESENT, PREDOMINANTLY PMN NO ORGANISMS SEEN CYTOSPIN Gram Stain Report Called to,Read Back By and Verified With: WALTER,M. RN _0  ON 8.11.17 BY MCCOY,N.    Culture PENDING    Report Status PENDING   Synovial cell count + diff, w/ crystals     Status: Abnormal   Collection Time: 04/14/16  1:16 PM  Result Value Ref Range   Color, Synovial YELLOW YELLOW   Appearance-Synovial TURBID (A) CLEAR   Crystals, Fluid INTRACELLULAR MONOSODIUM URATE CRYSTALS    WBC, Synovial 87,000 (H) 0 - 200 /cu mm   Neutrophil, Synovial 90 (H) 0 - 25 %   Monocyte-Macrophage-Synovial Fluid 10 (L) 50 - 90 %   Dg Chest 2 View  Result Date: 04/14/2016 CLINICAL DATA:  55 year old male with a history of metastatic esophageal carcinoma. Worsening low back pain. EXAM: CHEST  2 VIEW COMPARISON:  Chest CT 04/10/2016 FINDINGS: Cardiomediastinal silhouette within normal limits in size and contour. Right port catheter via IJ approach with the tip appearing to terminate superior vena cava. No confluent airspace disease, pneumothorax, or pleural effusion. No displaced fracture identified. Vertebral body heights maintained on the lateral view. IMPRESSION: No radiographic evidence of acute cardiopulmonary disease. Right-sided port catheter. Signed, Dulcy Fanny. Earleen Newport, DO [facility] Radiology Electronically Signed   By: Corrie Mckusick D.O.   On: 04/14/2016 09:59   Dg Knee Complete 4 Views Left  Result Date: 04/14/2016 CLINICAL DATA:  Bilateral knee pain EXAM: LEFT KNEE - COMPLETE 4+ VIEW COMPARISON:  None. FINDINGS: No acute fracture or dislocation is noted. Degenerative changes are noted with osteophytic  formation. A large joint effusion is noted IMPRESSION: Degenerative change. Large joint effusion. Electronically Signed   By: Inez Catalina M.D.   On: 04/14/2016 10:30   Dg  Knee Complete 4 Views Right  Result Date: 04/14/2016 CLINICAL DATA:  55 year old male with a history of metastatic esophageal carcinoma with new knee pain EXAM: RIGHT KNEE - COMPLETE 4+ VIEW COMPARISON:  None. FINDINGS: No displaced fracture. No lucent lesion. There is soft tissue swelling in the suprapatellar region, potentially large joint effusion. Extensive vascular calcifications. Advanced tricompartmental osteoarthritis. IMPRESSION: No displaced fracture. No aggressive lucent lesion or sclerotic lesion. Significant suprapatellar soft tissue swelling/ joint effusion. Correlation with physical exam may be useful. 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 Signed, Dulcy Fanny. Earleen Newport, DO [facility] Electronically Signed   By: Corrie Mckusick D.O.   On: 04/14/2016 10:29    ROS: Review of Systems - Negative except for that reported in HPI  Physican Exam: Blood pressure 129/78, pulse 98, temperature 100.2 F (37.9 C), temperature source Oral, resp. rate 13, height _0  (1.753 m), weight 59.4 kg (131 lb), SpO2 100 %. .physicalexam  Awake alert Family in room Alopecia A bit diaphoretic Right s/p aspiration by ER MD, still with warmth and effusion Painful passive ROM No ipsilateral hip or ankle pain No left knee pain  Assessment/Plan Assessment: Septic right knee   Plan: Aspiration reveals 87000 WBC with left shift Recent completion of chemo/radiation treatment for esophageal cancer ESR 120+ Fever Elevated WBC  In this setting I feel it is best to treat aggressively and wash his knee out Plan to go to OR tonight for I&D ID consult Has port in right chest IV antibiotics for 4-6 weeks,  possibly Cultures pending from ER aspiration NPO Consent completed   Pietro Cassis. Alvan Dame, MD  04/14/2016, 4:01 PM

## 2016-04-14 NOTE — ED Notes (Signed)
PA-C made aware of patient chem-8 results.

## 2016-04-14 NOTE — ED Notes (Signed)
WBC predominately in PMN, no organisms seen

## 2016-04-15 LAB — BASIC METABOLIC PANEL
Anion gap: 8 (ref 5–15)
BUN: 6 mg/dL (ref 6–20)
CALCIUM: 7.8 mg/dL — AB (ref 8.9–10.3)
CO2: 26 mmol/L (ref 22–32)
CREATININE: 0.56 mg/dL — AB (ref 0.61–1.24)
Chloride: 98 mmol/L — ABNORMAL LOW (ref 101–111)
GFR calc Af Amer: >60 mL/min (ref >60)
GLUCOSE: 125 mg/dL — AB (ref 65–99)
POTASSIUM: 3.5 mmol/L (ref 3.5–5.1)
SODIUM: 132 mmol/L — AB (ref 135–145)

## 2016-04-15 LAB — URINE CULTURE: Culture: 3000 — AB

## 2016-04-15 LAB — CBC
HCT: 29.7 % — ABNORMAL LOW (ref 39.0–52.0)
Hemoglobin: 10.3 g/dL — ABNORMAL LOW (ref 13.0–17.0)
MCH: 36 pg — AB (ref 26.0–34.0)
MCHC: 34.7 g/dL (ref 30.0–36.0)
MCV: 103.8 fL — AB (ref 78.0–100.0)
PLATELETS: 251 10*3/uL (ref 150–400)
RBC: 2.86 MIL/uL — AB (ref 4.22–5.81)
RDW: 16.5 % — AB (ref 11.5–15.5)
WBC: 17.2 10*3/uL — ABNORMAL HIGH (ref 4.0–10.5)

## 2016-04-15 MED ORDER — VANCOMYCIN HCL IN DEXTROSE 1-5 GM/200ML-% IV SOLN
1000.0000 mg | Freq: Two times a day (BID) | INTRAVENOUS | Status: DC
  Administered 2016-04-15 – 2016-04-16 (×3): 1000 mg via INTRAVENOUS
  Filled 2016-04-15 (×3): qty 200

## 2016-04-15 NOTE — Progress Notes (Signed)
   Subjective: 1 Day Post-Op Procedure(s) (LRB): IRRIGATION AND DEBRIDEMENT RIGHT KNEE, ARTHROTOMY (Right)  Pt doing well this morning Minimal pain and soreness in the knee Patient reports pain mild  Objective:   VITALS:   Vitals:   04/15/16 0010 04/15/16 0102  BP: (!) 129/95 128/90  Pulse: 98 88  Resp: 14 14  Temp: 98.9 F (37.2 C) 98.8 F (37.1 C)    Right knee dressing and drain intact Drain left in place due to recent surgery nv intact distally No edema  LABS  Recent Labs  04/14/16 1102 04/15/16 0515  HGB 11.6* 10.3*  HCT 33.8* 29.7*  WBC 19.5* 17.2*  PLT 281 251     Recent Labs  04/14/16 1102 04/15/16 0515  NA 131* 132*  K 3.4* 3.5  BUN 18 6  CREATININE 0.81 0.56*  GLUCOSE 87 125*     Assessment/Plan: 1 Day Post-Op Procedure(s) (LRB): IRRIGATION AND DEBRIDEMENT RIGHT KNEE, ARTHROTOMY (Right) Plan to leave the drain in today at least to this afternoon and Dr. Alvan Dame may pull it later Plan for IV antibiotics today with possible d/c tomorrow May weight bear as tolerated on the right lower extremity    Merla Riches, MPAS, PA-C  04/15/2016, 7:05 AM

## 2016-04-15 NOTE — Progress Notes (Signed)
Physical Therapy Treatment Patient Details Name: Samuel Henderson MRN: [id number] DOB: 09-26-1960 Today's Date: 04/15/2016    History of Present Illness Pt admitted with septic R knee s/p I&D by Dr Alvan Dame.  Pt with hx of Esophaeal CA with mets.    PT Comments    Pt demonstrating good carry over from am session and improvement in stability and safety awareness.  Progressed to stairs with rail and cane.  Follow Up Recommendations  No PT follow up     Equipment Recommendations  Rolling walker with 5" wheels    Recommendations for Other Services       Precautions / Restrictions Precautions Precautions: Fall Restrictions Weight Bearing Restrictions: No Other Position/Activity Restrictions: WBAT    Mobility  Bed Mobility Overal bed mobility: Needs Assistance Bed Mobility: Supine to Sit;Sit to Supine     Supine to sit: Supervision Sit to supine: Min guard   General bed mobility comments: cues for sequence and use of L LE to self assist  Transfers Overall transfer level: Needs assistance Equipment used: Rolling walker (2 wheeled) Transfers: Sit to/from Stand Sit to Stand: Min guard         General transfer comment: cues for LE managment and use of UEs to self assist  Ambulation/Gait Ambulation/Gait assistance: Min guard Ambulation Distance (Feet): 40 Feet Assistive device: Rolling walker (2 wheeled) Gait Pattern/deviations: Step-to pattern;Shuffle;Trunk flexed Gait velocity: decr Gait velocity interpretation: Below normal speed for age/gender General Gait Details: cues for posture, position from RW and sequence.   Stairs Stairs: Yes Stairs assistance: Min assist Stair Management: One rail Right;Step to pattern;Forwards;With cane Number of Stairs: 5 General stair comments: cues for sequence and foot/cane placement  Wheelchair Mobility    Modified Rankin (Stroke Patients Only)       Balance                                     Cognition Arousal/Alertness: Awake/alert Behavior During Therapy: WFL for tasks assessed/performed Overall Cognitive Status: Within Functional Limits for tasks assessed                      Exercises      General Comments        Pertinent Vitals/Pain Pain Assessment: 0-10 Pain Score: 4  Pain Location: R knee Pain Descriptors / Indicators: Aching;Sore Pain Intervention(s): Limited activity within patient's tolerance;Monitored during session;Ice applied    Home Living                      Prior Function            PT Goals (current goals can now be found in the care plan section) Acute Rehab PT Goals Patient Stated Goal: Regain IND and walk with decreased pain PT Goal Formulation: With patient Time For Goal Achievement: 04/20/16 Potential to Achieve Goals: Good Progress towards PT goals: Progressing toward goals    Frequency  Min 5X/week    PT Plan Discharge plan needs to be updated    Co-evaluation             End of Session Equipment Utilized During Treatment: Gait belt Activity Tolerance: Patient tolerated treatment well Patient left: in bed;with call bell/phone within reach;with family/visitor present     Time: [id number] PT Time Calculation (min) (ACUTE ONLY): 20 min  Charges:  $Gait Training: 8-22 mins  G Codes:      Samuel Henderson 2016-04-23, 4:14 PM

## 2016-04-15 NOTE — Progress Notes (Signed)
Patient arrived on the unit at approximately 2022 from the OR. He is alert and verbally responsive. Right knee surgical site wrapped in ace bandage and Hemovac drainage in place. No complaints voiced at this time.

## 2016-04-15 NOTE — Progress Notes (Addendum)
Pharmacy Antibiotic Note  Samuel Henderson is a 55 y.o. male with PMH of esophageal cancer admitted on 04/14/2016 with septic right knee s/p aspiration in ED and I&D in OR. Pharmacy has been consulted for Vancomycin dosing.  Plan:  Change vancomycin to 1000mg  IV q12h as plan is possibly home with antibiotics on 8/13  Monitor renal function, cultures, clinical course.  F/u ID recs.  Height: 5\' 9"  (175.3 cm) Weight: 142 lb 4.8 oz (64.5 kg) IBW/kg (Calculated) : 70.7  Temp (24hrs), Avg:99.1 F (37.3 C), Min:97.9 F (36.6 C), Max:101.1 F (38.4 C)   Recent Labs Lab 04/11/16 0935 04/11/16 0935 04/14/16 1102 04/14/16 1311 04/15/16 0515  WBC 19.7*  --  19.5*  --  17.2*  CREATININE  --  0.9 0.81  --  0.56*  LATICACIDVEN  --   --   --  0.96  --     Estimated Creatinine Clearance: 94.1 mL/min (by C-G formula based on SCr of 0.8 mg/dL).    No Known Allergies  Antimicrobials this admission: 8/11 >> Zosyn x 1 8/11 >> Vancomycin >>  Dose adjustments this admission: --  Microbiology results: 8/11 BCx: sent 8/11 UCx: sent  8/11 GS from R knee joint fluid: abundant WBC, no organisms 8/11 aerobic/anaerobic cx from R knee (from OR): sent  Thank you for allowing pharmacy to be a part of this patient's care.  Doreene Eland, PharmD, BCPS.   Pager: [phone] 04/15/2016 10:17 AM

## 2016-04-15 NOTE — Evaluation (Signed)
Physical Therapy Evaluation Patient Details Name: Samuel Henderson MRN: [id number] DOB: 03-10-1961 Today's Date: 04/15/2016   History of Present Illness  Pt admitted with septic R knee s/p I&D by Dr Samuel Henderson.  Pt with hx of Esophaeal CA with mets.  Clinical Impression  Pt s/p I&D R knee presents with decreased R LE strength/ROM and post op pain limiting functional mobility.  Pt should progress to dc home with assist of family and would benefit from follow up HHPT as well as RW for home use.    Follow Up Recommendations Home health PT    Equipment Recommendations  Rolling walker with 5" wheels    Recommendations for Other Services OT consult     Precautions / Restrictions Precautions Precautions: Fall Restrictions Weight Bearing Restrictions: No Other Position/Activity Restrictions: WBAT      Mobility  Bed Mobility Overal bed mobility: Needs Assistance Bed Mobility: Supine to Sit     Supine to sit: Min guard     General bed mobility comments: cues for sequence with pt self assisting R LE with UEs  Transfers Overall transfer level: Needs assistance Equipment used: Rolling walker (2 wheeled) Transfers: Sit to/from Stand Sit to Stand: Min assist         General transfer comment: cues for LE managment and use of UEs to self assist  Ambulation/Gait Ambulation/Gait assistance: Min assist Ambulation Distance (Feet): 98 Feet Assistive device: Rolling walker (2 wheeled) Gait Pattern/deviations: Step-to pattern;Shuffle;Trunk flexed Gait velocity: decr Gait velocity interpretation: Below normal speed for age/gender General Gait Details: cues for posture, position from RW and sequence.  Stairs            Wheelchair Mobility    Modified Rankin (Stroke Patients Only)       Balance Overall balance assessment: Needs assistance Sitting-balance support: Feet supported;No upper extremity supported Sitting balance-Leahy Scale: Good     Standing balance support:  No upper extremity supported Standing balance-Leahy Scale: Fair                               Pertinent Vitals/Pain Pain Assessment: 0-10 Pain Score: 4  Pain Location: R knee Pain Descriptors / Indicators: Aching;Sore Pain Intervention(s): Limited activity within patient's tolerance;Monitored during session;Premedicated before session;Ice applied    Home Living Family/patient expects to be discharged to:: Private residence Living Arrangements: Parent;Other relatives Available Help at Discharge: Family Type of Home: House Home Access: Stairs to enter Entrance Stairs-Rails: Psychiatric nurse of Steps: 4 Home Layout: One level Home Equipment: Cane - single point;Bedside commode      Prior Function Level of Independence: Independent               Hand Dominance        Extremity/Trunk Assessment   Upper Extremity Assessment: Overall WFL for tasks assessed           Lower Extremity Assessment: RLE deficits/detail RLE Deficits / Details: min ROM tolerated with 2+/5 quads       Communication   Communication: No difficulties  Cognition Arousal/Alertness: Awake/alert Behavior During Therapy: WFL for tasks assessed/performed Overall Cognitive Status: Within Functional Limits for tasks assessed                      General Comments      Exercises        Assessment/Plan    PT Assessment Patient needs continued PT services  PT Diagnosis Difficulty walking  PT Problem List Decreased strength;Decreased range of motion;Decreased activity tolerance;Decreased mobility;Decreased knowledge of use of DME;Pain  PT Treatment Interventions DME instruction;Gait training;Stair training;Functional mobility training;Therapeutic activities;Therapeutic exercise;Patient/family education   PT Goals (Current goals can be found in the Care Plan section) Acute Rehab PT Goals Patient Stated Goal: Regain IND and walk with decreased pain PT  Goal Formulation: With patient Time For Goal Achievement: 04/20/16 Potential to Achieve Goals: Good    Frequency Min 5X/week   Barriers to discharge        Co-evaluation               End of Session Equipment Utilized During Treatment: Gait belt Activity Tolerance: Patient tolerated treatment well Patient left: in chair;with call bell/phone within reach;with chair alarm set Nurse Communication: Mobility status         Time: [id number] PT Time Calculation (min) (ACUTE ONLY): 23 min   Charges:   PT Evaluation $PT Eval Low Complexity: 1 Procedure PT Treatments $Gait Training: 8-22 mins   PT G Codes:        Samuel Henderson 2016-05-10, 12:58 PM

## 2016-04-16 NOTE — Progress Notes (Signed)
   Subjective: 2 Days Post-Op Procedure(s) (LRB): IRRIGATION AND DEBRIDEMENT RIGHT KNEE, ARTHROTOMY (Right)  Pt doing well this morning Minimal to no pain in the knee Worked well with therapy yesterday Still awaiting culture results before d/c Patient reports pain as mild.  Objective:   VITALS:   Vitals:   04/16/16 0013 04/16/16 0434  BP: (!) 151/87 (!) 121/100  Pulse: 92 99  Resp: 15 15  Temp: 99.3 F (37.4 C) 98.7 F (37.1 C)    Right knee dressing intact rom has improved dramatically nv intact distally No rashes or edema  LABS  Recent Labs  04/14/16 1102 04/15/16 0515  HGB 11.6* 10.3*  HCT 33.8* 29.7*  WBC 19.5* 17.2*  PLT 281 251     Recent Labs  04/14/16 1102 04/15/16 0515  NA 131* 132*  K 3.4* 3.5  BUN 18 6  CREATININE 0.81 0.56*  GLUCOSE 87 125*     Assessment/Plan: 2 Days Post-Op Procedure(s) (LRB): IRRIGATION AND DEBRIDEMENT RIGHT KNEE, ARTHROTOMY (Right) Plan to d/c tomorrow Continue IV antibiotics Continue therapy and activity as tolerated    Merla Riches, MPAS, PA-C  04/16/2016, 7:41 AM

## 2016-04-16 NOTE — Progress Notes (Signed)
Physical Therapy Treatment Patient Details Name: Samuel Henderson MRN: [id number] DOB: 03-27-1961 Today's Date: 04/16/2016    History of Present Illness Pt admitted with septic R knee s/p I&D by Dr Alvan Dame.  Pt with hx of Esophaeal CA with mets.    PT Comments    POD # 2 Pt feeling better with decreased pain.  Assisted with amb around unit.  Pt stated he is able to get to and from the bathroom "on my own".  Plans to D/C to home in next day or so.     Follow Up Recommendations  No PT follow up     Equipment Recommendations  Rolling walker with 5" wheels    Recommendations for Other Services       Precautions / Restrictions Precautions Precautions: Fall Restrictions Weight Bearing Restrictions: No Other Position/Activity Restrictions: WBAT    Mobility  Bed Mobility Overal bed mobility: Modified Independent       Supine to sit: Modified independent (Device/Increase time)     General bed mobility comments: increased time  Transfers Overall transfer level: Needs assistance Equipment used: Rolling walker (2 wheeled) Transfers: Sit to/from [facility] Sit to Stand: Supervision Stand pivot transfers: Supervision       General transfer comment: good safety cognition and use of hands to staedy self   Ambulation/Gait Ambulation/Gait assistance: Supervision;Min guard Ambulation Distance (Feet): 225 Feet Assistive device: Rolling walker (2 wheeled) Gait Pattern/deviations: Step-through pattern Gait velocity: WFL   General Gait Details: <25% cues for posture, position from RW and sequence.   Stairs            Wheelchair Mobility    Modified Rankin (Stroke Patients Only)       Balance                                    Cognition Arousal/Alertness: Awake/alert Behavior During Therapy: WFL for tasks assessed/performed Overall Cognitive Status: Within Functional Limits for tasks assessed                       Exercises      General Comments        Pertinent Vitals/Pain Pain Assessment: 0-10 Pain Score: 2  Pain Location: R knee Pain Descriptors / Indicators: Discomfort Pain Intervention(s): Monitored during session;Repositioned    Home Living                      Prior Function            PT Goals (current goals can now be found in the care plan section) Progress towards PT goals: Progressing toward goals    Frequency  Min 5X/week    PT Plan Current plan remains appropriate    Co-evaluation             End of Session Equipment Utilized During Treatment: Gait belt Activity Tolerance: Patient tolerated treatment well Patient left: in bed;with call bell/phone within reach;with family/visitor present     Time: 1151-1204 PT Time Calculation (min) (ACUTE ONLY): 13 min  Charges:  $Gait Training: 8-22 mins                    G Codes:      Rica Koyanagi  PTA [facility]  Acute  Rehab Pager      [phone]

## 2016-04-16 NOTE — Brief Op Note (Signed)
04/14/2016  6:00PM  PATIENT:  Samuel Henderson  55 y.o. male  PRE-OPERATIVE DIAGNOSIS:  septic right knee joint  POST-OPERATIVE DIAGNOSIS:  septic right knee joint  PROCEDURE:  Procedure(s): IRRIGATION AND DEBRIDEMENT RIGHT KNEE, ARTHROTOMY (Right)  SURGEON:  Surgeon(s) and Role:    * Paralee Cancel, MD - Primary  PHYSICIAN ASSISTANT: None  ANESTHESIA:   general  EBL:  No intake/output data recorded.  BLOOD ADMINISTERED:none  DRAINS: (1 medium) Hemovact drain(s) in the right knee with  Suction Open   LOCAL MEDICATIONS USED:  NONE  SPECIMEN:  Source of Specimen:  right knee synovial fluid  DISPOSITION OF SPECIMEN:  PATHOLOGY  COUNTS:  YES  TOURNIQUET:   Total Tourniquet Time Documented: Thigh (Right) - 18 minutes Total: Thigh (Right) - 18 minutes   DICTATION: .Other Dictation: Dictation Number [id number]  PLAN OF CARE: Admit to inpatient   PATIENT DISPOSITION:  PACU - hemodynamically stable.   Delay start of Pharmacological VTE agent (>24hrs) due to surgical blood loss or risk of bleeding: no

## 2016-04-16 NOTE — Op Note (Signed)
Samuel Henderson, Samuel Henderson             ACCOUNT NO.:  [id number]  MEDICAL RECORD NO.:  [id number]  LOCATION:  [place]                         FACILITY:  [facility]  PHYSICIAN:  Pietro Cassis. Alvan Dame, M.D.  DATE OF BIRTH:  06-Apr-1961  DATE OF PROCEDURE:  04/14/2016 DATE OF DISCHARGE:                              OPERATIVE REPORT   PREOPERATIVE DIAGNOSIS:  Probable infected septic right knee.  POSTOPERATIVE DIAGNOSIS:  Septic right knee versus inflammatory arthropathy.  PROCEDURE:  Open arthrotomy with irrigation and debridement of right knee.  This including incisional debridement of the knee of about a 4-5 cm incision with open synovectomy in the suprapatellar pouch followed by irrigation with 6 L normal saline solution.  SURGEON:  Pietro Cassis. Alvan Dame, M.D.  ASSISTANT:  Surgical team.  ANESTHESIA:  General.  SPECIMENS:  Joint fluid was taken from as culture swabs.  He had already had a knee aspirate done in the emergency room, see below.  DRAINS:  One medium Hemovac.  TOURNIQUET TIME:  18 minutes at 250 mmHg.  COMPLICATIONS:  None apparent.  INDICATIONS FOR PROCEDURE:  Samuel Henderson is a 55 year old male with a recent history of treatment for esophageal cancer including chemotherapy and radiation treatment.  He finished his treatment up 3 weeks ago.  He presented to the emergency room with fevers and acute onset of pain in his right knee with swelling.  He was seen and evaluated in the emergency room and had a sedimentation rate noted to be 120.  He had a knee aspirate with synovial count of 87,000 with 90 neutrophils indicating a left shift.  I had a lengthy discussion with Mr. Marcellus family regarding his current situation.  He had no history of gout in the past.  Based on his recent potential immunocompromised state with treatment for esophageal cancer, this mounted white blood cell count and sedimentation rate as well as his large cell count in the synovium and left shift, I felt it was  in his best interest to proceed with an operative intervention as opposed to treating this conservatively.  Also due to the amount of pain he has had related to the effusion in his knee, I felt that he would benefit.  We reviewed the risks, benefits, potential treatment plans including IV antibiotics for 6 weeks.  We discussed the potential recurrence of infection and consent was obtained for benefit of above.  PROCEDURE IN DETAIL:  The patient was brought to the operative theater. Once adequate anesthesia was established, he received Zosyn in the emergency room.  Vancomycin will be ordered postoperatively.  He was positioned supine with a right thigh tourniquet placed.  Right lower extremity was then prepped and draped in sterile fashion.  Time-out was performed identifying the patient, planned procedure, and extremity.  I made a longitudinal based incision over the proximal aspect of the knee joint in the suprapatellar pouch.  Soft tissue dissection was carried out to the extensor mechanism.  An arthrotomy was made.  We opened up the joint, he had a significant amount of inflammatory fluid inside his joint.  There was a synovial tinge to it but not as much purulence.  Nonetheless, culture swabs were taken again.  Following evacuation  of this entire fusion, I performed an open synovectomy of the suprapatellar pouch.  The synovium was sent to Pathology.  Following this, I irrigated the knee out with 6 L of normal saline solution using pulse lavage.  I focused on the medial and lateral gutters, suprapatellar pouch and inferiorly.  Once the 6 L were completed, I placed a medium Hemovac drain deep, reapproximated the extensor mechanism using #1 PDS sutures.  The remaining wound was closed with 2-0 Vicryl and staples on the skin.  The skin was then cleaned, dried and dressed sterilely using Xeroform over the drain site and incision, followed by bulky dressing.  He was then brought to the  recovery room in stable condition tolerating the procedure well.  Findings were reviewed with the family.  Postoperative plan will be determined based on the culture results from the ER and OR.     Pietro Cassis Alvan Dame, M.D.     MDO/MEDQ  D:  04/16/2016  T:  04/16/2016  Job:  [id number]

## 2016-04-17 ENCOUNTER — Encounter (HOSPITAL_COMMUNITY): Payer: Self-pay | Admitting: Orthopedic Surgery

## 2016-04-17 LAB — VANCOMYCIN, TROUGH: Vancomycin Tr: 9 ug/mL — ABNORMAL LOW (ref 15–20)

## 2016-04-17 LAB — URIC ACID: URIC ACID, SERUM: 3.9 mg/dL — AB (ref 4.4–7.6)

## 2016-04-17 MED ORDER — CEPHALEXIN 500 MG PO CAPS
500.0000 mg | ORAL_CAPSULE | Freq: Three times a day (TID) | ORAL | Status: DC
  Administered 2016-04-17: 500 mg via ORAL
  Filled 2016-04-17: qty 1

## 2016-04-17 MED ORDER — HEPARIN SOD (PORK) LOCK FLUSH 100 UNIT/ML IV SOLN
500.0000 [IU] | INTRAVENOUS | Status: DC | PRN
  Administered 2016-04-17: 500 [IU]
  Filled 2016-04-17: qty 5

## 2016-04-17 MED ORDER — DICLOFENAC SODIUM 75 MG PO TBEC: 75.0000 mg | DELAYED_RELEASE_TABLET | Freq: Two times a day (BID) | ORAL | 0 refills | Status: DC

## 2016-04-17 MED ORDER — HEPARIN SOD (PORK) LOCK FLUSH 100 UNIT/ML IV SOLN: 500.0000 [IU] | INTRAVENOUS | Status: DC

## 2016-04-17 MED ORDER — ASPIRIN 81 MG PO CHEW: 81.0000 mg | CHEWABLE_TABLET | Freq: Two times a day (BID) | ORAL | 0 refills | Status: DC

## 2016-04-17 MED ORDER — DICLOFENAC SODIUM 75 MG PO TBEC
75.0000 mg | DELAYED_RELEASE_TABLET | Freq: Two times a day (BID) | ORAL | Status: DC
  Administered 2016-04-17: 75 mg via ORAL
  Filled 2016-04-17: qty 1

## 2016-04-17 MED ORDER — METHOCARBAMOL 500 MG PO TABS: 500.0000 mg | ORAL_TABLET | Freq: Four times a day (QID) | ORAL | 0 refills | Status: DC | PRN

## 2016-04-17 MED ORDER — HYDROCODONE-ACETAMINOPHEN 5-325 MG PO TABS: 1.0000 | ORAL_TABLET | ORAL | 0 refills | Status: DC | PRN

## 2016-04-17 MED ORDER — CEPHALEXIN 500 MG PO CAPS: 500.0000 mg | ORAL_CAPSULE | Freq: Three times a day (TID) | ORAL | 0 refills | Status: DC

## 2016-04-17 MED ORDER — VANCOMYCIN HCL 10 G IV SOLR
1250.0000 mg | Freq: Two times a day (BID) | INTRAVENOUS | Status: DC
  Filled 2016-04-17: qty 1250

## 2016-04-17 MED ORDER — DOCUSATE SODIUM 100 MG PO CAPS: 100.0000 mg | ORAL_CAPSULE | Freq: Two times a day (BID) | ORAL | 0 refills | Status: DC

## 2016-04-17 MED ORDER — POLYETHYLENE GLYCOL 3350 17 G PO PACK: 17.0000 g | PACK | Freq: Every day | ORAL | 0 refills | Status: DC | PRN

## 2016-04-17 NOTE — Progress Notes (Signed)
Pt discharged home in stable condition.  Discharge instructions and scripts given to patient. Pt verbalized understanding. Dressing changed as ordered.

## 2016-04-17 NOTE — Progress Notes (Signed)
Pharmacy Antibiotic Note  MASAYUKI BURGHARDT is a 55 y.o. male with PMH of esophageal cancer admitted on 04/14/2016 with septic right knee s/p aspiration in ED and I&D in OR. Pharmacy has been consulted for Vancomycin dosing.  8/14:  Vancomycin Trough level subtherapeutic at 9 mcg/ml on Vancomycin 1gm IV q12h.  AFebrile, WBC = 17.2  Scr = 0.56 with CrCl ~ 94 ml/min  Plan:  Change vancomycin to 1250mg  IV q12h as plan is possibly home with antibiotics on 8/13  Monitor renal function, cultures, clinical course.  F/u ID recs.  Height: 5\' 9"  (175.3 cm) Weight: 142 lb 4.8 oz (64.5 kg) IBW/kg (Calculated) : 70.7  Temp (24hrs), Avg:98.3 F (36.8 C), Min:98 F (36.7 C), Max:98.6 F (37 C)   Recent Labs Lab 04/11/16 0935 04/11/16 0935 04/14/16 1102 04/14/16 1311 04/15/16 0515 04/17/16 0545  WBC 19.7*  --  19.5*  --  17.2*  --   CREATININE  --  0.9 0.81  --  0.56*  --   LATICACIDVEN  --   --   --  0.96  --   --   VANCOTROUGH  --   --   --   --   --  9*    Estimated Creatinine Clearance: 94.1 mL/min (by C-G formula based on SCr of 0.8 mg/dL).    No Known Allergies  Antimicrobials this admission: 8/11 >> Zosyn x 1 8/11 >> Vancomycin >>  Dose adjustments this admission: 8/11 >> Vanc 750mg  IV q8h 8/12 >> Vanc changed to 1gm IV q12h 8/14 @ 0545 VT = 9 mcg/ml ==> change to 1250mg  IV q12h  Microbiology results: 8/11 BCx: NG x 2 days 8/11 UCx: insignificant growth (F) 8/11 GS from R knee joint fluid: abundant WBC, no organisms (F) 8/11 aerobic/anaerobic cx from R knee (from OR): NG x 1 day  Thank you for allowing pharmacy to be a part of this patient's care.  Leone Haven, PharmD 04/17/2016 6:32 AM

## 2016-04-17 NOTE — Progress Notes (Signed)
Patient ID: Samuel Henderson, male   DOB: 11-19-1960, 55 y.o.   MRN: [id number] Subjective: 3 Days Post-Op Procedure(s) (LRB): IRRIGATION AND DEBRIDEMENT RIGHT KNEE, ARTHROTOMY (Right)    Patient reports pain as mild.  No events.  Feeling a lot better  Objective:   VITALS:   Vitals:   04/16/16 2010 04/17/16 0506  BP: 114/75 132/90  Pulse: 93 97  Resp: 16 16  Temp: 98.6 F (37 C) 98 F (36.7 C)    Neurovascular intact Incision: dressing C/D/I  LABS  Recent Labs  04/14/16 1102 04/15/16 0515  HGB 11.6* 10.3*  HCT 33.8* 29.7*  WBC 19.5* 17.2*  PLT 281 251     Recent Labs  04/14/16 1102 04/15/16 0515  NA 131* 132*  K 3.4* 3.5  BUN 18 6  CREATININE 0.81 0.56*  GLUCOSE 87 125*    No results for input(s): LABPT, INR in the last 72 hours.   Assessment/Plan: 3 Days Post-Op Procedure(s) (LRB): IRRIGATION AND DEBRIDEMENT RIGHT KNEE, ARTHROTOMY (Right)  Plan: Given intra-articular crystal findings, negative growth to date, and pain relief I plan to discharge him today Will discharge on diclofenac, PO antibiotics Follow up in 2 weeks for staple removal  Reviewed this plan with him today

## 2016-04-19 LAB — CULTURE, BODY FLUID W GRAM STAIN -BOTTLE: Culture: NO GROWTH

## 2016-04-19 LAB — CULTURE, BLOOD (ROUTINE X 2)
CULTURE: NO GROWTH
Culture: NO GROWTH

## 2016-04-19 LAB — CULTURE, BODY FLUID-BOTTLE

## 2016-04-20 LAB — AEROBIC/ANAEROBIC CULTURE (SURGICAL/DEEP WOUND)

## 2016-04-20 LAB — AEROBIC/ANAEROBIC CULTURE W GRAM STAIN (SURGICAL/DEEP WOUND): Culture: NO GROWTH

## 2016-04-20 NOTE — Discharge Summary (Signed)
Physician Discharge Summary  Patient ID: Samuel Henderson MRN: [id number] DOB/AGE: 1960/12/25 55 y.o.  Admit date: 04/14/2016 Discharge date: 04/17/2016   Procedures:  Procedure(s) (LRB): IRRIGATION AND DEBRIDEMENT RIGHT KNEE, ARTHROTOMY (Right)  Attending Physician:  Dr. Paralee Cancel   Admission Diagnoses:   Painful right knee, fever   Discharge Diagnoses:  Active Problems:   Septic joint of right knee joint [facility])  Past Medical History:  Diagnosis Date  . Arthritis   . Esophageal cancer ([place])   . Hypertension   . Knee pain     HPI:    Pt is a 55 y.o. male complaining of right knee pain for past 24 hours.  Pleasant male with recent history of treatment for esophageal cancer presented to ER with recent onset of right knee pain and fever of 103.  No other joint complaints.  Last ate yesterday. No history of gout.  PCP: Donnajean Lopes, MD   Discharged Condition: good  Hospital Course:  Patient underwent the above stated procedure on 04/14/2016. Patient tolerated the procedure well and brought to the recovery room in good condition and subsequently to the floor.  POD #1 BP: 128/90 ; Pulse: 88 ; Temp: 98.8 F (37.1 C) ; Resp: 14 Pt doing well this morning.  Minimal pain and soreness in the knee.  Patient reports pain mild. Right knee dressing and drain intact.  Drain left in place due to recent surgery.  nv intact distally and no edema.  LABS  Basename    HGB     10.3  HCT     29.7   POD #2  BP: 121/100 ; Pulse: 99 ; Temp: 98.7 F (37.1 C) ; Resp: 15 Pt doing well this morning.  Minimal to no pain in the knee.  Worked well with therapy yesterday.  Still awaiting culture results before d/c.  Patient reports pain as mild. Right knee dressing intact.  ROM has improved dramatically.  NV intact distally and no rashes or edema.  LABS   No new labs  POD #3  BP: 132/90 ; Pulse: 97 ; Temp: 98 F (36.7 C) ; Resp: 16 Patient reports pain as mild.  No events.  Feeling a  lot better. Neurovascular intact and incision: dressing C/D/I.  LABS   No new labs   Discharge Exam: General appearance: alert, cooperative and no distress Extremities: Homans sign is negative, no sign of DVT, no edema, redness or tenderness in the calves or thighs and no ulcers, gangrene or trophic changes  Disposition: Home with follow up in 2 weeks   Follow-up Information    Mauri Pole, MD. Schedule an appointment as soon as possible for a visit in 2 week(s).   Specialty:  Orthopedic Surgery Contact information: [address] [place] [address] [id number]           Discharge Instructions    Call MD / Call 911    Complete by:  As directed   If you experience chest pain or shortness of breath, CALL 911 and be transported to the hospital emergency room.  If you develope a fever above 101 F, pus (white drainage) or increased drainage or redness at the wound, or calf pain, call your surgeon's office.   Change dressing    Complete by:  As directed   Maintain surgical dressing until follow up in the clinic. If the edges start to pull up, may reinforce with tape. If the dressing is no longer working, may remove and  cover with gauze and tape, but must keep the area dry and clean.  Call with any questions or concerns.   Constipation Prevention    Complete by:  As directed   Drink plenty of fluids.  Prune juice may be helpful.  You may use a stool softener, such as Colace (over the counter) 100 mg twice a day.  Use MiraLax (over the counter) for constipation as needed.   Diet - low sodium heart healthy    Complete by:  As directed   Discharge instructions    Complete by:  As directed   Maintain surgical dressing until follow up in the clinic. If the edges start to pull up, may reinforce with tape. If the dressing is no longer working, may remove and cover with gauze and tape, but must keep the area dry and clean.  Follow up in 2 weeks at [facility].  Call with any questions or concerns.   Increase activity slowly as tolerated    Complete by:  As directed   Weight bearing as tolerated with assist device (walker, cane, etc) as directed, use it as long as suggested by your surgeon or therapist, typically at least 4-6 weeks.   TED hose    Complete by:  As directed   Use stockings (TED hose) for 2 weeks on both leg(s).  You may remove them at night for sleeping.        Medication List    STOP taking these medications   meloxicam 15 MG tablet Commonly known as:  MOBIC     TAKE these medications   amLODipine 5 MG tablet Commonly known as:  NORVASC Take 5 mg by mouth daily.   aspirin 81 MG chewable tablet Chew 1 tablet (81 mg total) by mouth 2 (two) times daily. Take for 2 weeks.   cephALEXin 500 MG capsule Commonly known as:  KEFLEX Take 1 capsule (500 mg total) by mouth every 8 (eight) hours.   dexamethasone 4 MG tablet Commonly known as:  DECADRON Take 2 tablets (8 mg total) by mouth daily. Start the day after chemotherapy for 4 days.   diclofenac 75 MG EC tablet Commonly known as:  VOLTAREN Take 1 tablet (75 mg total) by mouth 2 (two) times daily.   docusate sodium 100 MG capsule Commonly known as:  COLACE Take 1 capsule (100 mg total) by mouth 2 (two) times daily.   HYDROcodone-acetaminophen 5-325 MG tablet Commonly known as:  NORCO/VICODIN Take 1-2 tablets by mouth every 4 (four) hours as needed (breakthrough pain).   hydrocortisone 20 MG tablet Commonly known as:  CORTEF Take 20 mg by mouth 2 (two) times daily.   lidocaine-prilocaine cream Commonly known as:  EMLA Apply 1 application topically once. What changed:  when to take this  reasons to take this   lisinopril-hydrochlorothiazide 10-12.5 MG tablet Commonly known as:  PRINZIDE,ZESTORETIC Take 1 tablet by mouth daily.   LORazepam 0.5 MG tablet Commonly known as:  ATIVAN TAKE 1 TABLET BY MOUTH EVERY 6 HOURS AS NEEDED FOR NAUSEA AND VOMITING     methocarbamol 500 MG tablet Commonly known as:  ROBAXIN Take 1 tablet (500 mg total) by mouth every 6 (six) hours as needed for muscle spasms.   omeprazole 20 MG capsule Commonly known as:  PRILOSEC TAKE ONE CAPSULE BY MOUTH TWICE DAILY BEFORE A MEAL What changed:  See the new instructions.   ondansetron 8 MG tablet Commonly known as:  ZOFRAN Take 1 tablet (8 mg total) by  mouth every 8 (eight) hours as needed for nausea.   oxyCODONE 5 MG/5ML solution Commonly known as:  ROXICODONE Take 5-10 mLs (5-10 mg total) by mouth every 4 (four) hours as needed for moderate pain or severe pain.   polyethylene glycol packet Commonly known as:  MIRALAX / GLYCOLAX Take 17 g by mouth daily as needed for mild constipation.   prochlorperazine 10 MG tablet Commonly known as:  COMPAZINE Take 1 tablet (10 mg total) by mouth every 6 (six) hours as needed (Nausea or vomiting).   VIAGRA 100 MG tablet Generic drug:  sildenafil Take 100 mg by mouth daily as needed for erectile dysfunction. Reported on 10/22/2015        Signed: [place] Pugh. Jaquisha Frech   PA-C  04/20/2016, 9:24 AM

## 2016-04-21 ENCOUNTER — Ambulatory Visit: Payer: 59

## 2016-04-25 ENCOUNTER — Encounter: Payer: Self-pay | Admitting: Hematology

## 2016-04-25 NOTE — Progress Notes (Signed)
Patient brought in additional documentation for [place] and Medicaid app. Faxed Medicaid application and documentation to [facility]. Fax received ok per confirmation sheet. Placed [facility] [employer] and supporting documents in Inter-office mail to be delivered to the [employer] ATTN:Customer Service.

## 2016-05-09 ENCOUNTER — Ambulatory Visit (HOSPITAL_BASED_OUTPATIENT_CLINIC_OR_DEPARTMENT_OTHER): Payer: 59

## 2016-05-09 ENCOUNTER — Encounter: Payer: Self-pay | Admitting: Hematology

## 2016-05-09 ENCOUNTER — Other Ambulatory Visit (HOSPITAL_BASED_OUTPATIENT_CLINIC_OR_DEPARTMENT_OTHER): Payer: 59

## 2016-05-09 ENCOUNTER — Ambulatory Visit (HOSPITAL_BASED_OUTPATIENT_CLINIC_OR_DEPARTMENT_OTHER): Payer: 59 | Admitting: Hematology

## 2016-05-09 VITALS — BP 117/61 | HR 90 | Temp 98.6°F | Resp 18 | Wt 126.3 lb

## 2016-05-09 DIAGNOSIS — G893 Neoplasm related pain (acute) (chronic): Secondary | ICD-10-CM | POA: Diagnosis not present

## 2016-05-09 DIAGNOSIS — C154 Malignant neoplasm of middle third of esophagus: Secondary | ICD-10-CM

## 2016-05-09 DIAGNOSIS — C155 Malignant neoplasm of lower third of esophagus: Secondary | ICD-10-CM

## 2016-05-09 DIAGNOSIS — C787 Secondary malignant neoplasm of liver and intrahepatic bile duct: Secondary | ICD-10-CM

## 2016-05-09 DIAGNOSIS — C159 Malignant neoplasm of esophagus, unspecified: Secondary | ICD-10-CM

## 2016-05-09 DIAGNOSIS — C7951 Secondary malignant neoplasm of bone: Secondary | ICD-10-CM

## 2016-05-09 LAB — COMPREHENSIVE METABOLIC PANEL
ALT: 15 U/L (ref 0–55)
ANION GAP: 10 meq/L (ref 3–11)
AST: 20 U/L (ref 5–34)
Albumin: 3.6 g/dL (ref 3.5–5.0)
Alkaline Phosphatase: 71 U/L (ref 40–150)
BUN: 8.7 mg/dL (ref 7.0–26.0)
CHLORIDE: 99 meq/L (ref 98–109)
CO2: 27 meq/L (ref 22–29)
CREATININE: 0.7 mg/dL (ref 0.7–1.3)
Calcium: 9.3 mg/dL (ref 8.4–10.4)
EGFR: >90 mL/min/{1.73_m2} (ref >90)
Glucose: 110 mg/dl (ref 70–140)
POTASSIUM: 3.6 meq/L (ref 3.5–5.1)
SODIUM: 137 meq/L (ref 136–145)
TOTAL PROTEIN: 7.5 g/dL (ref 6.4–8.3)
Total Bilirubin: 0.45 mg/dL (ref 0.20–1.20)

## 2016-05-09 LAB — CBC & DIFF AND RETIC
BASO%: 0.1 % (ref 0.0–2.0)
Basophils Absolute: 0 10*3/uL (ref 0.0–0.1)
EOS%: 0.9 % (ref 0.0–7.0)
Eosinophils Absolute: 0.1 10*3/uL (ref 0.0–0.5)
HCT: 32 % — ABNORMAL LOW (ref 38.4–49.9)
HGB: 10.8 g/dL — ABNORMAL LOW (ref 13.0–17.1)
IMMATURE RETIC FRACT: 2 % — AB (ref 3.00–10.60)
LYMPH#: 2.1 10*3/uL (ref 0.9–3.3)
LYMPH%: 24.5 % (ref 14.0–49.0)
MCH: 35 pg — ABNORMAL HIGH (ref 27.2–33.4)
MCHC: 33.8 g/dL (ref 32.0–36.0)
MCV: 103.6 fL — ABNORMAL HIGH (ref 79.3–98.0)
MONO#: 1.1 10*3/uL — AB (ref 0.1–0.9)
MONO%: 12.7 % (ref 0.0–14.0)
NEUT#: 5.3 10*3/uL (ref 1.5–6.5)
NEUT%: 61.8 % (ref 39.0–75.0)
PLATELETS: 359 10*3/uL (ref 140–400)
RBC: 3.09 10*6/uL — AB (ref 4.20–5.82)
RDW: 14.7 % — AB (ref 11.0–14.6)
RETIC %: 1.27 % (ref 0.80–1.80)
RETIC CT ABS: 39.24 10*3/uL (ref 34.80–93.90)
WBC: 8.6 10*3/uL (ref 4.0–10.3)

## 2016-05-09 MED ORDER — SENNOSIDES-DOCUSATE SODIUM 8.6-50 MG PO TABS: 2.0000 | ORAL_TABLET | Freq: Every day | ORAL | 1 refills | Status: DC

## 2016-05-09 MED ORDER — DENOSUMAB 120 MG/1.7ML ~~LOC~~ SOLN
120.0000 mg | Freq: Once | SUBCUTANEOUS | Status: AC
  Administered 2016-05-09: 120 mg via SUBCUTANEOUS
  Filled 2016-05-09: qty 1.7

## 2016-05-09 MED ORDER — ONDANSETRON HCL 8 MG PO TABS: 8.0000 mg | ORAL_TABLET | Freq: Three times a day (TID) | ORAL | 3 refills | Status: AC | PRN

## 2016-05-09 MED ORDER — OXYCODONE HCL 5 MG/5ML PO SOLN: 5.0000 mg | ORAL | 0 refills | Status: DC | PRN

## 2016-05-09 MED ORDER — POLYETHYLENE GLYCOL 3350 17 G PO PACK: 17.0000 g | PACK | Freq: Every day | ORAL | 0 refills | Status: DC

## 2016-05-09 MED ORDER — CHLORHEXIDINE GLUCONATE 0.12 % MT SOLN: 15.0000 mL | Freq: Two times a day (BID) | OROMUCOSAL | 0 refills | Status: DC

## 2016-05-09 MED FILL — CHLORHEXIDINE 0.12% RINSE: 0.12 | 15 days supply | Qty: 473 | Fill #0

## 2016-05-09 MED FILL — ONDANSETRON HCL 8 MG TABLET: 8 | 10 days supply | Qty: 30 | Fill #0

## 2016-05-09 NOTE — Patient Instructions (Signed)
Denosumab injection  What is this medicine?  DENOSUMAB (den oh sue mab) slows bone breakdown. Prolia is used to treat osteoporosis in women after menopause and in men. Xgeva is used to prevent bone fractures and other bone problems caused by cancer bone metastases. Xgeva is also used to treat giant cell tumor of the bone.  This medicine may be used for other purposes; ask your health care provider or pharmacist if you have questions.  What should I tell my health care provider before I take this medicine?  They need to know if you have any of these conditions:  -dental disease  -eczema  -infection or history of infections  -kidney disease or on dialysis  -low blood calcium or vitamin D  -malabsorption syndrome  -scheduled to have surgery or tooth extraction  -taking medicine that contains denosumab  -thyroid or parathyroid disease  -an unusual reaction to denosumab, other medicines, foods, dyes, or preservatives  -pregnant or trying to get pregnant  -breast-feeding  How should I use this medicine?  This medicine is for injection under the skin. It is given by a health care professional in a hospital or clinic setting.  If you are getting Prolia, a special MedGuide will be given to you by the pharmacist with each prescription and refill. Be sure to read this information carefully each time.  For Prolia, talk to your pediatrician regarding the use of this medicine in children. Special care may be needed. For Xgeva, talk to your pediatrician regarding the use of this medicine in children. While this drug may be prescribed for children as young as 13 years for selected conditions, precautions do apply.  Overdosage: If you think you have taken too much of this medicine contact a poison control center or emergency room at once.  NOTE: This medicine is only for you. Do not share this medicine with others.  What if I miss a dose?  It is important not to miss your dose. Call your doctor or health care professional if you are  unable to keep an appointment.  What may interact with this medicine?  Do not take this medicine with any of the following medications:  -other medicines containing denosumab  This medicine may also interact with the following medications:  -medicines that suppress the immune system  -medicines that treat cancer  -steroid medicines like prednisone or cortisone  This list may not describe all possible interactions. Give your health care provider a list of all the medicines, herbs, non-prescription drugs, or dietary supplements you use. Also tell them if you smoke, drink alcohol, or use illegal drugs. Some items may interact with your medicine.  What should I watch for while using this medicine?  Visit your doctor or health care professional for regular checks on your progress. Your doctor or health care professional may order blood tests and other tests to see how you are doing.  Call your doctor or health care professional if you get a cold or other infection while receiving this medicine. Do not treat yourself. This medicine may decrease your body's ability to fight infection.  You should make sure you get enough calcium and vitamin D while you are taking this medicine, unless your doctor tells you not to. Discuss the foods you eat and the vitamins you take with your health care professional.  See your dentist regularly. Brush and floss your teeth as directed. Before you have any dental work done, tell your dentist you are receiving this medicine.  Do   not become pregnant while taking this medicine or for 5 months after stopping it. Women should inform their doctor if they wish to become pregnant or think they might be pregnant. There is a potential for serious side effects to an unborn child. Talk to your health care professional or pharmacist for more information.  What side effects may I notice from receiving this medicine?  Side effects that you should report to your doctor or health care professional as soon as  possible:  -allergic reactions like skin rash, itching or hives, swelling of the face, lips, or tongue  -breathing problems  -chest pain  -fast, irregular heartbeat  -feeling faint or lightheaded, falls  -fever, chills, or any other sign of infection  -muscle spasms, tightening, or twitches  -numbness or tingling  -skin blisters or bumps, or is dry, peels, or red  -slow healing or unexplained pain in the mouth or jaw  -unusual bleeding or bruising  Side effects that usually do not require medical attention (Report these to your doctor or health care professional if they continue or are bothersome.):  -muscle pain  -stomach upset, gas  This list may not describe all possible side effects. Call your doctor for medical advice about side effects. You may report side effects to FDA at 1-800-FDA-1088.  Where should I keep my medicine?  This medicine is only given in a clinic, doctor's office, or other health care setting and will not be stored at home.  NOTE: This sheet is a summary. It may not cover all possible information. If you have questions about this medicine, talk to your doctor, pharmacist, or health care provider.      2016, [employer]. (2012-02-19 12:37:47)

## 2016-05-10 MED FILL — oxyCODONE HCL 5 MG/5ML SOLN: 5 | 7 days supply | Qty: 473 | Fill #0

## 2016-05-10 MED FILL — DICLOFENAC SOD EC 75 MG TAB: 75 | 30 days supply | Qty: 60 | Fill #0

## 2016-05-12 ENCOUNTER — Other Ambulatory Visit: Payer: Self-pay | Admitting: Hematology

## 2016-05-12 MED FILL — LORazepam 0.5 MG TABS: 0.5 | 7 days supply | Qty: 30 | Fill #0

## 2016-05-13 ENCOUNTER — Emergency Department (HOSPITAL_COMMUNITY): Payer: 59

## 2016-05-13 ENCOUNTER — Inpatient Hospital Stay (HOSPITAL_COMMUNITY)
Admission: EM | Admit: 2016-05-13 | Discharge: 2016-05-15 | DRG: 554 | Disposition: A | Discharge status: Home / Self Care | Payer: 59 | Attending: Internal Medicine | Admitting: Internal Medicine

## 2016-05-13 ENCOUNTER — Encounter (HOSPITAL_COMMUNITY): Payer: Self-pay

## 2016-05-13 DIAGNOSIS — Z808 Family history of malignant neoplasm of other organs or systems: Secondary | ICD-10-CM

## 2016-05-13 DIAGNOSIS — F17210 Nicotine dependence, cigarettes, uncomplicated: Secondary | ICD-10-CM | POA: Diagnosis present

## 2016-05-13 DIAGNOSIS — R509 Fever, unspecified: Secondary | ICD-10-CM | POA: Diagnosis present

## 2016-05-13 DIAGNOSIS — Z9221 Personal history of antineoplastic chemotherapy: Secondary | ICD-10-CM | POA: Diagnosis not present

## 2016-05-13 DIAGNOSIS — R1310 Dysphagia, unspecified: Secondary | ICD-10-CM | POA: Diagnosis present

## 2016-05-13 DIAGNOSIS — Z7982 Long term (current) use of aspirin: Secondary | ICD-10-CM | POA: Diagnosis not present

## 2016-05-13 DIAGNOSIS — M25511 Pain in right shoulder: Secondary | ICD-10-CM | POA: Diagnosis present

## 2016-05-13 DIAGNOSIS — Z801 Family history of malignant neoplasm of trachea, bronchus and lung: Secondary | ICD-10-CM | POA: Diagnosis not present

## 2016-05-13 DIAGNOSIS — C7951 Secondary malignant neoplasm of bone: Secondary | ICD-10-CM | POA: Diagnosis present

## 2016-05-13 DIAGNOSIS — R6510 Systemic inflammatory response syndrome (SIRS) of non-infectious origin without acute organ dysfunction: Secondary | ICD-10-CM | POA: Diagnosis present

## 2016-05-13 DIAGNOSIS — M119 Crystal arthropathy, unspecified: Secondary | ICD-10-CM | POA: Diagnosis present

## 2016-05-13 DIAGNOSIS — E871 Hypo-osmolality and hyponatremia: Secondary | ICD-10-CM | POA: Diagnosis present

## 2016-05-13 DIAGNOSIS — Z79899 Other long term (current) drug therapy: Secondary | ICD-10-CM | POA: Diagnosis not present

## 2016-05-13 DIAGNOSIS — D649 Anemia, unspecified: Secondary | ICD-10-CM | POA: Diagnosis present

## 2016-05-13 DIAGNOSIS — C787 Secondary malignant neoplasm of liver and intrahepatic bile duct: Secondary | ICD-10-CM | POA: Diagnosis present

## 2016-05-13 DIAGNOSIS — C155 Malignant neoplasm of lower third of esophagus: Secondary | ICD-10-CM | POA: Diagnosis present

## 2016-05-13 DIAGNOSIS — R000 Tachycardia, unspecified: Secondary | ICD-10-CM

## 2016-05-13 DIAGNOSIS — M109 Gout, unspecified: Principal | ICD-10-CM | POA: Diagnosis present

## 2016-05-13 DIAGNOSIS — Z95828 Presence of other vascular implants and grafts: Secondary | ICD-10-CM

## 2016-05-13 DIAGNOSIS — E876 Hypokalemia: Secondary | ICD-10-CM | POA: Diagnosis present

## 2016-05-13 DIAGNOSIS — I10 Essential (primary) hypertension: Secondary | ICD-10-CM | POA: Diagnosis present

## 2016-05-13 LAB — COMPREHENSIVE METABOLIC PANEL
ALK PHOS: 72 U/L (ref 38–126)
ALT: 22 U/L (ref 17–63)
ANION GAP: 11 (ref 5–15)
AST: 30 U/L (ref 15–41)
Albumin: 4.2 g/dL (ref 3.5–5.0)
BUN: 10 mg/dL (ref 6–20)
CALCIUM: 9.2 mg/dL (ref 8.9–10.3)
CHLORIDE: 89 mmol/L — AB (ref 101–111)
CO2: 30 mmol/L (ref 22–32)
Creatinine, Ser: 0.55 mg/dL — ABNORMAL LOW (ref 0.61–1.24)
GFR calc non Af Amer: >60 mL/min (ref >60)
Glucose, Bld: 114 mg/dL — ABNORMAL HIGH (ref 65–99)
Potassium: 3.2 mmol/L — ABNORMAL LOW (ref 3.5–5.1)
SODIUM: 130 mmol/L — AB (ref 135–145)
Total Bilirubin: 0.5 mg/dL (ref 0.3–1.2)
Total Protein: 8.3 g/dL — ABNORMAL HIGH (ref 6.5–8.1)

## 2016-05-13 LAB — URINE MICROSCOPIC-ADD ON: BACTERIA UA: NONE SEEN

## 2016-05-13 LAB — CBC WITH DIFFERENTIAL/PLATELET
Basophils Absolute: 0 10*3/uL (ref 0.0–0.1)
Basophils Relative: 0 %
EOS ABS: 0 10*3/uL (ref 0.0–0.7)
EOS PCT: 0 %
HCT: 31.8 % — ABNORMAL LOW (ref 39.0–52.0)
Hemoglobin: 10.8 g/dL — ABNORMAL LOW (ref 13.0–17.0)
LYMPHS ABS: 1.7 10*3/uL (ref 0.7–4.0)
Lymphocytes Relative: 11 %
MCH: 35 pg — AB (ref 26.0–34.0)
MCHC: 34 g/dL (ref 30.0–36.0)
MCV: 102.9 fL — ABNORMAL HIGH (ref 78.0–100.0)
MONO ABS: 1.7 10*3/uL — AB (ref 0.1–1.0)
MONOS PCT: 12 %
Neutro Abs: 11.3 10*3/uL — ABNORMAL HIGH (ref 1.7–7.7)
Neutrophils Relative %: 77 %
PLATELETS: 398 10*3/uL (ref 150–400)
RBC: 3.09 MIL/uL — ABNORMAL LOW (ref 4.22–5.81)
RDW: 14.1 % (ref 11.5–15.5)
WBC: 14.7 10*3/uL — ABNORMAL HIGH (ref 4.0–10.5)

## 2016-05-13 LAB — I-STAT CG4 LACTIC ACID, ED: LACTIC ACID, VENOUS: 0.54 mmol/L (ref 0.5–1.9)

## 2016-05-13 LAB — URINALYSIS, ROUTINE W REFLEX MICROSCOPIC
Bilirubin Urine: NEGATIVE
Glucose, UA: NEGATIVE mg/dL
Ketones, ur: NEGATIVE mg/dL
LEUKOCYTES UA: NEGATIVE
NITRITE: NEGATIVE
PH: 6.5 (ref 5.0–8.0)
Protein, ur: NEGATIVE mg/dL
Specific Gravity, Urine: 1.014 (ref 1.005–1.030)

## 2016-05-13 LAB — URIC ACID: URIC ACID, SERUM: 4.6 mg/dL (ref 4.4–7.6)

## 2016-05-13 MED ORDER — PIPERACILLIN-TAZOBACTAM 3.375 G IVPB 30 MIN
3.3750 g | INTRAVENOUS | Status: AC
  Administered 2016-05-13: 3.375 g via INTRAVENOUS
  Filled 2016-05-13: qty 50

## 2016-05-13 MED ORDER — VANCOMYCIN HCL 10 G IV SOLR
1250.0000 mg | Freq: Two times a day (BID) | INTRAVENOUS | Status: DC
  Administered 2016-05-14: 1250 mg via INTRAVENOUS
  Filled 2016-05-13: qty 1250

## 2016-05-13 MED ORDER — LIDOCAINE-PRILOCAINE 2.5-2.5 % EX CREA: 1.0000 "application " | TOPICAL_CREAM | Freq: Every day | CUTANEOUS | Status: DC | PRN

## 2016-05-13 MED ORDER — METHOCARBAMOL 500 MG PO TABS: 500.0000 mg | ORAL_TABLET | Freq: Every day | ORAL | Status: DC | PRN

## 2016-05-13 MED ORDER — ENOXAPARIN SODIUM 40 MG/0.4ML ~~LOC~~ SOLN
40.0000 mg | SUBCUTANEOUS | Status: DC
  Administered 2016-05-13 – 2016-05-14 (×2): 40 mg via SUBCUTANEOUS
  Filled 2016-05-13 (×3): qty 0.4

## 2016-05-13 MED ORDER — SODIUM CHLORIDE 0.9 % IV BOLUS (SEPSIS)
1000.0000 mL | Freq: Once | INTRAVENOUS | Status: AC
  Administered 2016-05-13: 1000 mL via INTRAVENOUS

## 2016-05-13 MED ORDER — LORAZEPAM 0.5 MG PO TABS: 0.5000 mg | ORAL_TABLET | Freq: Four times a day (QID) | ORAL | Status: DC | PRN

## 2016-05-13 MED ORDER — POLYETHYLENE GLYCOL 3350 17 G PO PACK
17.0000 g | PACK | Freq: Every day | ORAL | Status: DC
  Administered 2016-05-13 – 2016-05-14 (×2): 17 g via ORAL
  Filled 2016-05-13 (×2): qty 1

## 2016-05-13 MED ORDER — ONDANSETRON HCL 4 MG/2ML IJ SOLN: 4.0000 mg | Freq: Four times a day (QID) | INTRAMUSCULAR | Status: DC | PRN

## 2016-05-13 MED ORDER — OXYCODONE HCL 5 MG/5ML PO SOLN
5.0000 mg | ORAL | Status: DC | PRN
  Administered 2016-05-14: 10 mg via ORAL
  Filled 2016-05-13: qty 10

## 2016-05-13 MED ORDER — SODIUM CHLORIDE 0.9 % IV SOLN
1250.0000 mg | INTRAVENOUS | Status: AC
  Administered 2016-05-13: 1250 mg via INTRAVENOUS
  Filled 2016-05-13 (×2): qty 1250

## 2016-05-13 MED ORDER — ONDANSETRON HCL 4 MG PO TABS: 4.0000 mg | ORAL_TABLET | Freq: Four times a day (QID) | ORAL | Status: DC | PRN

## 2016-05-13 MED ORDER — PIPERACILLIN-TAZOBACTAM 3.375 G IVPB
3.3750 g | Freq: Three times a day (TID) | INTRAVENOUS | Status: DC
  Administered 2016-05-13 – 2016-05-14 (×2): 3.375 g via INTRAVENOUS
  Filled 2016-05-13 (×2): qty 50

## 2016-05-13 MED ORDER — KETOROLAC TROMETHAMINE 30 MG/ML IJ SOLN
15.0000 mg | Freq: Once | INTRAMUSCULAR | Status: AC
  Administered 2016-05-13: 15 mg via INTRAVENOUS
  Filled 2016-05-13: qty 1

## 2016-05-13 MED ORDER — SENNOSIDES-DOCUSATE SODIUM 8.6-50 MG PO TABS: 2.0000 | ORAL_TABLET | Freq: Every evening | ORAL | Status: DC | PRN

## 2016-05-13 MED ORDER — PROCHLORPERAZINE MALEATE 10 MG PO TABS: 10.0000 mg | ORAL_TABLET | Freq: Every day | ORAL | Status: DC | PRN

## 2016-05-13 MED ORDER — PANTOPRAZOLE SODIUM 40 MG PO TBEC
40.0000 mg | DELAYED_RELEASE_TABLET | Freq: Every day | ORAL | Status: DC
  Administered 2016-05-13 – 2016-05-14 (×2): 40 mg via ORAL
  Filled 2016-05-13 (×2): qty 1

## 2016-05-13 NOTE — Progress Notes (Signed)
Pt. Arrived to floor from ED via stretcher. Alert and oriented x 4, no respiratory distress noted. Family at bedside.

## 2016-05-13 NOTE — H&P (Signed)
History and Physical    Samuel Henderson AVW:[id number] DOB: 10/15/1960 DOA: 05/13/2016  PCP: Donnajean Lopes, MD  Outpatient Specialists: oncology, Dr. Irene Limbo Patient coming from: home  Chief Complaint: shoulder pain  HPI: Samuel Henderson is a 55 y.o. male with medical history significant of metastatic esophageal cancer, HTN, presents to the hospital with few days of right shoulder pain. Patient has been having mild shoulder aches for months, however over the past 1-2 days he is experiencing excruciating shoulder pain and is unable to sleep at night. His pain is worse with movement. He has denies subjective fever or chills. He was recently hospitalized with fever and right knee pain with concerns for septic knee however and went for washout, cultures have remained negative and it was thought to be non infectious. Of note, he is getting chemotherapy with last cycle ~4-5 weeks ago. He has no other joint pains, his right knee is doing well. He has no chest pain or shortness of breath, no abdominal pain, nausea or vomiting. He has dysphagia in the setting of esophageal malignancy, however this appears to be improved.   ED Course: in the ED patient was febrile to 100.6, HR 101-105, labs pertinent for leukocytosis WBC 14, Na 130, K 3.2. Lactic acid is normal. Shoulder XR negative. Dr. Alvan Dame with orthopedic surgery consulted, [place] asked to admit.   Review of Systems: As per HPI otherwise 10 point review of systems negative.   Past Medical History:  Diagnosis Date  . Arthritis   . Esophageal cancer ([place])   . Hypertension   . Knee pain     Past Surgical History:  Procedure Laterality Date  . ESOPHAGOGASTRODUODENOSCOPY (EGD) WITH ESOPHAGEAL DILATION     and biopsy  . IRRIGATION AND DEBRIDEMENT KNEE Right 04/14/2016   Procedure: IRRIGATION AND DEBRIDEMENT RIGHT KNEE, ARTHROTOMY;  Surgeon: Paralee Cancel, MD;  Location: [facility];  Service: Orthopedics;  Laterality: Right;  . KNEE SURGERY    . LESION  REMOVAL  1969   hip     reports that he has been smoking Cigarettes.  He has a 5.00 pack-year smoking history. He has never used smokeless tobacco. He reports that he uses drugs, including Marijuana. He reports that he does not drink alcohol.  No Known Allergies  Family History  Problem Relation Age of Onset  . Esophageal cancer Father   . Cancer Father     nasopharyngeal   . Diabetes Sister   . Cancer Sister     multiple myeloma  . Colon cancer Neg Hx     Prior to Admission medications   Medication Sig Start Date End Date Taking? Authorizing Provider  acetaminophen (TYLENOL) 500 MG tablet Take 1,000 mg by mouth every 6 (six) hours as needed for moderate pain or headache.   Yes Historical Provider, MD  amLODipine (NORVASC) 5 MG tablet Take 5 mg by mouth daily. 04/25/16  Yes Historical Provider, MD  Calcium Carbonate-Vitamin D (CALCIUM 600+D PO) Take 1 tablet by mouth daily.   Yes Historical Provider, MD  docusate sodium (COLACE) 100 MG capsule Take 100 mg by mouth daily as needed for mild constipation.   Yes Historical Provider, MD  lidocaine-prilocaine (EMLA) cream Apply 1 application topically once. Patient taking differently: Apply 1 application topically daily as needed (for port access).  12/07/15 12/06/16 Yes Gautam Juleen China, MD  lisinopril-hydrochlorothiazide (PRINZIDE,ZESTORETIC) 10-12.5 MG tablet Take 1 tablet by mouth daily. 04/21/16  Yes Historical Provider, MD  LORazepam (ATIVAN) 0.5 MG tablet TAKE 1  TABLET BY MOUTH EVERY 6 HOURS AS NEEDED FOR NAUSEA AND VOMITING 05/12/16  Yes Gautam Juleen China, MD  methocarbamol (ROBAXIN) 500 MG tablet Take 500 mg by mouth daily as needed for muscle spasms.  04/17/16  Yes Historical Provider, MD  omeprazole (PRILOSEC) 20 MG capsule TAKE ONE CAPSULE BY MOUTH TWICE DAILY BEFORE A MEAL Patient taking differently: TAKE 76m BY MOUTH TWICE DAILY BEFORE A MEAL when needed for acid reflux/ heartburn 02/17/16  Yes Gautam KJuleen China MD  ondansetron  (ZOFRAN) 8 MG tablet Take 1 tablet (8 mg total) by mouth every 8 (eight) hours as needed for nausea or vomiting. 05/09/16  Yes GBrunetta Genera MD  oxyCODONE (ROXICODONE) 5 MG/5ML solution Take 5-10 mLs (5-10 mg total) by mouth every 4 (four) hours as needed for moderate pain or severe pain. 05/09/16  Yes GBrunetta Genera MD  prochlorperazine (COMPAZINE) 10 MG tablet Take 10 mg by mouth daily as needed for nausea.  03/20/16  Yes Historical Provider, MD  VIAGRA 100 MG tablet Take 100 mg by mouth daily as needed for erectile dysfunction. Reported on 10/22/2015 09/10/15  Yes Historical Provider, MD  aspirin 81 MG chewable tablet Chew 1 tablet (81 mg total) by mouth 2 (two) times daily. Take for 2 weeks. Patient not taking: Reported on 05/13/2016 04/17/16   MDanae Orleans PA-C  chlorhexidine (PERIDEX) 0.12 % solution Use as directed 15 mLs in the mouth or throat 2 (two) times daily. Swish and spit. 05/09/16   GBrunetta Genera MD  diclofenac (VOLTAREN) 75 MG EC tablet Take 1 tablet (75 mg total) by mouth 2 (two) times daily. Patient not taking: Reported on 05/13/2016 04/17/16   MDanae Orleans PA-C  HYDROcodone-acetaminophen (NORCO/VICODIN) 5-325 MG tablet Take 1-2 tablets by mouth every 4 (four) hours as needed (breakthrough pain). Patient not taking: Reported on 05/13/2016 04/17/16   MDanae Orleans PA-C  polyethylene glycol (MIRALAX / GLYCOLAX) packet Take 17 g by mouth daily. Patient not taking: Reported on 05/13/2016 05/09/16   GBrunetta Genera MD  senna-docusate (SENNA S) 8.6-50 MG tablet Take 2 tablets by mouth at bedtime. Patient not taking: Reported on 05/13/2016 05/09/16   GBrunetta Genera MD    Physical Exam: Vitals:   05/13/16 0913 05/13/16 1123 05/13/16 1345  BP: 135/75 148/90 126/87  Pulse: 105 101 102  Resp: _0 Temp: 100.3 F (37.9 C) 100.6 F (38.1 C)   TempSrc: Oral Rectal   SpO2: 99% 95% 97%    Constitutional: NAD, calm, comfortable Vitals:   05/13/16 0913 05/13/16 1123  05/13/16 1345  BP: 135/75 148/90 126/87  Pulse: 105 101 102  Resp: _1 Temp: 100.3 F (37.9 C) 100.6 F (38.1 C)   TempSrc: Oral Rectal   SpO2: 99% 95% 97%   Eyes: PERRL, lids and conjunctivae normal ENMT: Mucous membranes are moist. Posterior pharynx clear of any exudate or lesions. Neck: normal, supple,  Respiratory: clear to auscultation bilaterally, no wheezing, no crackles. Normal respiratory effort. No accessory muscle use.  Cardiovascular: Regular rate and rhythm, no murmurs / rubs / gallops. No extremity edema. 2+ pedal pulses.  Abdomen: no tenderness, no masses palpated. Bowel sounds positive.  Musculoskeletal: slight redness superior aspect right shoulder, tender to palpation and very tender with passive motions.  Skin: no rashes, lesions, ulcers. No induration Neurologic: CN 2-12 grossly intact. Strength 5/5 in all 4.  Psychiatric: Normal judgment and insight. Alert and oriented x 3. Normal mood.   Labs on  Admission: I have personally reviewed following labs and imaging studies  CBC:  Recent Labs Lab 05/09/16 1008 05/13/16 1023  WBC 8.6 14.7*  NEUTROABS 5.3 11.3*  HGB 10.8* 10.8*  HCT 32.0* 31.8*  MCV 103.6* 102.9*  PLT 359 664   Basic Metabolic Panel:  Recent Labs Lab 05/09/16 1008 05/13/16 1023  NA 137 130*  K 3.6 3.2*  CL  --  89*  CO2 27 30  GLUCOSE 110 114*  BUN 8.7 10  CREATININE 0.7 0.55*  CALCIUM 9.3 9.2   GFR: Estimated Creatinine Clearance: 83.6 mL/min (by C-G formula based on SCr of 0.8 mg/dL). Liver Function Tests:  Recent Labs Lab 05/09/16 1008 05/13/16 1023  AST 20 30  ALT 15 22  ALKPHOS 71 72  BILITOT 0.45 0.5  PROT 7.5 8.3*  ALBUMIN 3.6 4.2   No results for input(s): LIPASE, AMYLASE in the last 168 hours. No results for input(s): AMMONIA in the last 168 hours. Coagulation Profile: No results for input(s): INR, PROTIME in the last 168 hours. Cardiac Enzymes: No results for input(s): CKTOTAL, CKMB, CKMBINDEX,  TROPONINI in the last 168 hours. BNP (last 3 results) No results for input(s): PROBNP in the last 8760 hours. HbA1C: No results for input(s): HGBA1C in the last 72 hours. CBG: No results for input(s): GLUCAP in the last 168 hours. Lipid Profile: No results for input(s): CHOL, HDL, LDLCALC, TRIG, CHOLHDL, LDLDIRECT in the last 72 hours. Thyroid Function Tests: No results for input(s): TSH, T4TOTAL, FREET4, T3FREE, THYROIDAB in the last 72 hours. Anemia Panel: No results for input(s): VITAMINB12, FOLATE, FERRITIN, TIBC, IRON, RETICCTPCT in the last 72 hours. Urine analysis:    Component Value Date/Time   COLORURINE YELLOW 05/13/2016 1026   APPEARANCEUR CLEAR 05/13/2016 1026   LABSPEC 1.014 05/13/2016 1026   PHURINE 6.5 05/13/2016 1026   GLUCOSEU NEGATIVE 05/13/2016 1026   HGBUR TRACE (A) 05/13/2016 1026   BILIRUBINUR NEGATIVE 05/13/2016 1026   KETONESUR NEGATIVE 05/13/2016 1026   PROTEINUR NEGATIVE 05/13/2016 1026   NITRITE NEGATIVE 05/13/2016 1026   LEUKOCYTESUR NEGATIVE 05/13/2016 1026   Sepsis Labs: _0 (procalcitonin:4,lacticidven:4) )No results found for this or any previous visit (from the past 240 hour(s)).   Radiological Exams on Admission: Dg Chest 2 View  Result Date: 05/13/2016 CLINICAL DATA:  55 year old male with a history of right shoulder pain EXAM: CHEST  2 VIEW COMPARISON:  04/14/2016 FINDINGS: Cardiomediastinal silhouette within normal limits. No confluent airspace disease, pneumothorax, or pleural effusion. Right IJ port catheter with the tip appearing to terminate at the superior cavoatrial junction. No displaced fracture. Degenerative changes of the bilateral acromioclavicular joint. IMPRESSION: No radiographic evidence of acute cardiopulmonary disease. Right IJ port catheter with the tip appearing to terminate superior cavoatrial junction. Degenerative changes the bilateral acromioclavicular joints. Signed, Dulcy Fanny. Earleen Newport, DO [facility] Radiology Electronically Signed   By: Corrie Mckusick D.O.   On: 05/13/2016 10:32   Dg Shoulder Right  Result Date: 05/13/2016 CLINICAL DATA:  55 year old male with a history of right shoulder pain EXAM: RIGHT SHOULDER - 2+ VIEW COMPARISON:  None. FINDINGS: No displaced fracture. Glenohumeral joint appears congruent. Unremarkable scapular Y-view. Degenerative changes at the acromioclavicular joint. Right IJ port catheter with intact catheter. IMPRESSION: Negative for acute bony abnormality. Degenerative changes of the acromioclavicular joint. Signed, Dulcy Fanny. Earleen Newport, DO [facility] Radiology Electronically Signed   By: Corrie Mckusick D.O.   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 Aurea Graff input - ? Crystal arthropathy vs septic joint - started on vancomycin and zosyn in the ER, continue - blood cultures obtained, monitor. 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 Alvan Dame  Admission status: inpatient    Marzetta Board, MD [facility] Pager [phone]  If 7PM-7AM, please contact night-coverage [email or web address] Password [email or web address]  05/13/2016, 2:10 PM

## 2016-05-13 NOTE — ED Triage Notes (Signed)
Pt had port placed this year but cannot recall date.  Pt with rt shoulder pain up to neck.  Pt was vomiting 2 days ago and felt pull in right neck.  No fever.

## 2016-05-13 NOTE — ED Notes (Signed)
Patient transported to X-ray 

## 2016-05-13 NOTE — Progress Notes (Signed)
Pharmacy Antibiotic Note  Samuel Henderson is a 55 y.o. male with recent septic right knee joint, metastaticesophageal cancer with chemotherapy on hold, admitted on 05/13/2016 with shoulder pain and fever.  Pharmacy has been consulted for Vancomycin and Zosyn dosing for possible bacteremia.   CrCl ~57 ml/min  Plan: Vancomycin 1250mg  IV q12h (based off previous trough) Zosyn 3.375g IV q8h (infuse over 4 hours) F/u renal function, cultures, clinical course, VT at Css    Temp (24hrs), Avg:100.5 F (38.1 C), Min:100.3 F (37.9 C), Max:100.6 F (38.1 C)   Recent Labs Lab 05/09/16 1008 05/09/16 1008 05/13/16 1023 05/13/16 1233  WBC 8.6  --  14.7*  --   CREATININE  --  0.7 0.55*  --   LATICACIDVEN  --   --   --  0.54    Estimated Creatinine Clearance: 83.6 mL/min (by C-G formula based on SCr of 0.8 mg/dL).    No Known Allergies  Antimicrobials this admission: 9/9 Vancomycin >>  9/9 Zosyn >>   Dose adjustments this admission: 8/14 VT = 9 on Vanc 1g IV q12h (SCr 0.56-0.8)  Microbiology results: 9/9 BCx: Collected 9/9 UCx: Collected   Thank you for allowing pharmacy to be a part of this patient's care.  Ralene Bathe, PharmD, BCPS 05/13/2016, 1:58 PM  Pager: [phone]

## 2016-05-13 NOTE — ED Provider Notes (Signed)
[facility] DEPT Provider Note   CSN: [id number] Arrival date & time: 05/13/16  0857     History   Chief Complaint Chief Complaint  Patient presents with  . Shoulder Pain    HPI Samuel Henderson is a 55 y.o. male.  HPI   2 days ago started to have pain in right shoulder, right shoulder blade radiating around to front and to front of neck.  Patient concerned had a port.  4-5 weeks ago was last chemo. Has not had arm swelling. Did hurt to move arm initially but that is improving.   8/11 had septic right knee joint with I/D with Dr. Alvan Dame however cultures of fluid did not show anything. Not having any knee pain now.   Temperature 100.3 here today, no temperature at home. No cough, runny nose, sore throat, abdominal pain, diarrhea, urinary symptoms, ear pain/rash, no chest pain/SOB. No pleuritic pain. Occasional pain with swallowing.    Past Medical History:  Diagnosis Date  . Arthritis   . Esophageal cancer ([place])   . Hypertension   . Knee pain     Patient Active Problem List   Diagnosis Date Noted  . Fever 05/13/2016  . SIRS (systemic inflammatory response syndrome) ([place]) 05/13/2016  . Septic joint of right knee joint ([place]) 04/14/2016  . Port catheter in place 02/08/2016  . Bone metastases ([place]) 02/08/2016  . Dehydration 11/16/2015  . URI (upper respiratory infection) 11/16/2015  . Liver metastases ([place]) 10/27/2015  . Hypokalemia 10/16/2015  . 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 Dysphagia 09/15/2015  . HTN (hypertension) 09/02/2015        Past Surgical History:  Procedure Laterality Date  . ESOPHAGOGASTRODUODENOSCOPY (EGD) WITH ESOPHAGEAL DILATION     and biopsy  . IRRIGATION AND DEBRIDEMENT KNEE Right 04/14/2016   Procedure: IRRIGATION AND DEBRIDEMENT RIGHT KNEE, ARTHROTOMY;  Surgeon: Paralee Cancel, MD;  Location: [facility];  Service: Orthopedics;  Laterality:  Right;  . KNEE SURGERY    . LESION REMOVAL  1969   hip       Home Medications    Prior to Admission medications   Medication Sig Start Date End Date Taking? Authorizing Provider  acetaminophen (TYLENOL) 500 MG tablet Take 1,000 mg by mouth every 6 (six) hours as needed for moderate pain or headache.   Yes Historical Provider, MD  amLODipine (NORVASC) 5 MG tablet Take 5 mg by mouth daily. 04/25/16  Yes Historical Provider, MD  Calcium Carbonate-Vitamin D (CALCIUM 600+D PO) Take 1 tablet by mouth daily.   Yes Historical Provider, MD  docusate sodium (COLACE) 100 MG capsule Take 100 mg by mouth daily as needed for mild constipation.   Yes Historical Provider, MD  lidocaine-prilocaine (EMLA) cream Apply 1 application topically once. Patient taking differently: Apply 1 application topically daily as needed (for port access).  12/07/15 12/06/16 Yes Gautam Juleen China, MD  lisinopril-hydrochlorothiazide (PRINZIDE,ZESTORETIC) 10-12.5 MG tablet Take 1 tablet by mouth daily. 04/21/16  Yes Historical Provider, MD  LORazepam (ATIVAN) 0.5 MG tablet TAKE 1 TABLET BY MOUTH EVERY 6 HOURS AS NEEDED FOR NAUSEA AND VOMITING 05/12/16  Yes Gautam Juleen China, MD  methocarbamol (ROBAXIN) 500 MG tablet Take 500 mg by mouth daily as needed for muscle spasms.  04/17/16  Yes Historical Provider, MD  omeprazole (PRILOSEC) 20 MG capsule TAKE ONE CAPSULE BY MOUTH TWICE DAILY BEFORE A MEAL Patient taking differently: TAKE 54m BY MOUTH TWICE  DAILY BEFORE A MEAL when needed for acid reflux/ heartburn 02/17/16  Yes Gautam Juleen China, MD  ondansetron (ZOFRAN) 8 MG tablet Take 1 tablet (8 mg total) by mouth every 8 (eight) hours as needed for nausea or vomiting. 05/09/16  Yes Brunetta Genera, MD  oxyCODONE (ROXICODONE) 5 MG/5ML solution Take 5-10 mLs (5-10 mg total) by mouth every 4 (four) hours as needed for moderate pain or severe pain. 05/09/16  Yes Brunetta Genera, MD  prochlorperazine (COMPAZINE) 10 MG tablet Take 10 mg  by mouth daily as needed for nausea.  03/20/16  Yes Historical Provider, MD  VIAGRA 100 MG tablet Take 100 mg by mouth daily as needed for erectile dysfunction. Reported on 10/22/2015 09/10/15  Yes Historical Provider, MD  aspirin 81 MG chewable tablet Chew 1 tablet (81 mg total) by mouth 2 (two) times daily. Take for 2 weeks. Patient not taking: Reported on 05/13/2016 04/17/16   Danae Orleans, PA-C  chlorhexidine (PERIDEX) 0.12 % solution Use as directed 15 mLs in the mouth or throat 2 (two) times daily. Swish and spit. 05/09/16   Brunetta Genera, MD  diclofenac (VOLTAREN) 75 MG EC tablet Take 1 tablet (75 mg total) by mouth 2 (two) times daily. Patient not taking: Reported on 05/13/2016 04/17/16   Danae Orleans, PA-C  HYDROcodone-acetaminophen (NORCO/VICODIN) 5-325 MG tablet Take 1-2 tablets by mouth every 4 (four) hours as needed (breakthrough pain). Patient not taking: Reported on 05/13/2016 04/17/16   Danae Orleans, PA-C  polyethylene glycol (MIRALAX / GLYCOLAX) packet Take 17 g by mouth daily. Patient not taking: Reported on 05/13/2016 05/09/16   Brunetta Genera, MD  senna-docusate (SENNA S) 8.6-50 MG tablet Take 2 tablets by mouth at bedtime. Patient not taking: Reported on 05/13/2016 05/09/16   Brunetta Genera, MD    Family History Family History  Problem Relation Age of Onset  . Esophageal cancer Father   . Cancer Father     nasopharyngeal   . Diabetes Sister   . Cancer Sister     multiple myeloma  . Colon cancer Neg Hx     Social History Social History  Substance Use Topics  . Smoking status: Current Every Day Smoker    Packs/day: 0.50    Years: 20.00    Types: Cigarettes  . Smokeless tobacco: Never Used  . Alcohol use No     Allergies   Review of patient's allergies indicates no known allergies.   Review of Systems Review of Systems  Constitutional: Negative for fever (had on arrival to ED, unknown at home).  HENT: Negative for congestion, ear pain, rhinorrhea and  sore throat.   Eyes: Negative for visual disturbance.  Respiratory: Negative for cough and shortness of breath.   Cardiovascular: Negative for chest pain.  Gastrointestinal: Positive for vomiting (2 days ago). Negative for abdominal pain, diarrhea and nausea.  Genitourinary: Negative for difficulty urinating and dysuria.  Musculoskeletal: Positive for neck pain. Negative for back pain and neck stiffness.  Skin: Negative for rash.  Neurological: Negative for syncope and headaches.     Physical Exam Updated Vital Signs BP 124/83 (BP Location: Left Arm)   Pulse 84   Temp 97.7 F (36.5 C) (Oral)   Resp 17   Ht 5' 9"  (1.753 m)   Wt 125 lb 14.1 oz (57.1 kg)   SpO2 100%   BMI 18.59 kg/m   Physical Exam  Constitutional: He is oriented to person, place, and time. He appears well-developed and well-nourished. No  distress.  HENT:  Head: Normocephalic and atraumatic.  Eyes: Conjunctivae and EOM are normal.  Neck: Normal range of motion.  Cardiovascular: Normal rate, regular rhythm, normal heart sounds and intact distal pulses.  Exam reveals no gallop and no friction rub.   No murmur heard. Pulmonary/Chest: Effort normal and breath sounds normal. No respiratory distress. He has no wheezes. He has no rales.  Abdominal: Soft. He exhibits no distension. There is no tenderness. There is no guarding.  Musculoskeletal: He exhibits no edema.       Right shoulder: He exhibits decreased range of motion, tenderness, bony tenderness and pain. He exhibits no swelling, no effusion, no deformity, no laceration and normal pulse.  Neurological: He is alert and oriented to person, place, and time.  Skin: Skin is warm and dry. He is not diaphoretic.  Nursing note and vitals reviewed.    ED Treatments / Results  Labs (all labs ordered are listed, but only abnormal results are displayed) Labs Reviewed  CBC WITH DIFFERENTIAL/PLATELET - Abnormal; Notable for the following:       Result Value   WBC 14.7  (*)    RBC 3.09 (*)    Hemoglobin 10.8 (*)    HCT 31.8 (*)    MCV 102.9 (*)    MCH 35.0 (*)    Neutro Abs 11.3 (*)    Monocytes Absolute 1.7 (*)    All other components within normal limits  COMPREHENSIVE METABOLIC PANEL - Abnormal; Notable for the following:    Sodium 130 (*)    Potassium 3.2 (*)    Chloride 89 (*)    Glucose, Bld 114 (*)    Creatinine, Ser 0.55 (*)    Total Protein 8.3 (*)    All other components within normal limits  URINALYSIS, ROUTINE W REFLEX MICROSCOPIC (NOT AT [facility]) - Abnormal; Notable for the following:    Hgb urine dipstick TRACE (*)    All other components within normal limits  URINE MICROSCOPIC-ADD ON - Abnormal; Notable for the following:    Squamous Epithelial / LPF 0-5 (*)    All other components within normal limits  CULTURE, BLOOD (ROUTINE X 2)  CULTURE, BLOOD (ROUTINE X 2)  URINE CULTURE  CULTURE, BLOOD (SINGLE)  URIC ACID  COMPREHENSIVE METABOLIC PANEL  CBC  I-STAT CG4 LACTIC ACID, ED  I-STAT CG4 LACTIC ACID, ED    EKG  EKG Interpretation None       Radiology Dg Chest 2 View  Result Date: 05/13/2016 CLINICAL DATA:  55 year old male with a history of right shoulder pain EXAM: CHEST  2 VIEW COMPARISON:  04/14/2016 FINDINGS: Cardiomediastinal silhouette within normal limits. No confluent airspace disease, pneumothorax, or pleural effusion. Right IJ port catheter with the tip appearing to terminate at the superior cavoatrial junction. No displaced fracture. Degenerative changes of the bilateral acromioclavicular joint. IMPRESSION: No radiographic evidence of acute cardiopulmonary disease. Right IJ port catheter with the tip appearing to terminate superior cavoatrial junction. Degenerative changes the bilateral acromioclavicular joints. Signed, Dulcy Fanny. Earleen Newport, DO [facility] Radiology Electronically Signed   By: Corrie Mckusick D.O.   On: 05/13/2016 10:32   Dg Shoulder Right  Result Date:  05/13/2016 CLINICAL DATA:  55 year old male with a history of right shoulder pain EXAM: RIGHT SHOULDER - 2+ VIEW COMPARISON:  None. FINDINGS: No displaced fracture. Glenohumeral joint appears congruent. Unremarkable scapular Y-view. Degenerative changes at the acromioclavicular joint. Right IJ port catheter with intact catheter. IMPRESSION: Negative for acute bony  abnormality. Degenerative changes of the acromioclavicular joint. Signed, Dulcy Fanny. Earleen Newport, DO [facility] Radiology Electronically Signed   By: Corrie Mckusick D.O.   On: 05/13/2016 10:33    Procedures Procedures (including critical care time)  Medications Ordered in ED Medications  vancomycin (VANCOCIN) 1,250 mg in sodium chloride 0.9 % 250 mL IVPB (1,250 mg Intravenous Given 05/13/16 1648)    Followed by  vancomycin (VANCOCIN) 1,250 mg in sodium chloride 0.9 % 250 mL IVPB (not administered)  piperacillin-tazobactam (ZOSYN) IVPB 3.375 g (3.375 g Intravenous New Bag/Given 05/13/16 1452)    Followed by  piperacillin-tazobactam (ZOSYN) IVPB 3.375 g (3.375 g Intravenous Given 05/13/16 2119)  enoxaparin (LOVENOX) injection 40 mg (40 mg Subcutaneous Given 05/13/16 1807)  ondansetron (ZOFRAN) tablet 4 mg (not administered)    Or  ondansetron (ZOFRAN) injection 4 mg (not administered)  polyethylene glycol (MIRALAX / GLYCOLAX) packet 17 g (17 g Oral Given 05/13/16 1646)  senna-docusate (Senokot-S) tablet 2 tablet (not administered)  methocarbamol (ROBAXIN) tablet 500 mg (not administered)  prochlorperazine (COMPAZINE) tablet 10 mg (not administered)  LORazepam (ATIVAN) tablet 0.5 mg (not administered)  oxyCODONE (ROXICODONE) 5 MG/5ML solution 5-10 mg (not administered)  pantoprazole (PROTONIX) EC tablet 40 mg (40 mg Oral Given 05/13/16 1646)  lidocaine-prilocaine (EMLA) cream 1 application (not administered)  sodium chloride 0.9 % bolus 1,000 mL (0 mLs Intravenous Stopped 05/13/16 1211)  sodium chloride  0.9 % bolus 1,000 mL (0 mLs Intravenous Stopped 05/13/16 1334)  ketorolac (TORADOL) 30 MG/ML injection 15 mg (15 mg Intravenous Given 05/13/16 1358)     Initial Impression / Assessment and Plan / ED Course  I have reviewed the triage vital signs and the nursing notes.  Pertinent labs & imaging results that were available during my care of the patient were reviewed by me and considered in my medical decision making (see chart for details).  Clinical Course   55 year old male with a history of esophageal cancer with metastases to the liver and bone, hypertension, recent incision and drainage of the right knee, with initial thought of septic arthritis, however negative cultures and presence of a crystal arthropathy presents with concern for right shoulder pain. Patient is febrile on arrival to 100.6. He denies any other infectious symptoms, negative chest x-ray, no sign of UTI. Discussed with Dr. Alvan Dame, of orthopedics, given concern of fever with right shoulder pain. Dr. Alvan Dame will evaluate the patient, however at this time feels it more likely his shoulder pain is secondary to gout given his prior presentation and cultures. Of note, patient was febrile at time of that admission, without clear source identified. Given patient's fever at this time, without clear source, and concern for possible bacteremia, will give vancomycin and Zosyn and admitted to the hospitalist for further care.  He has mild tachycardia, however normal lactate, was given 2 boluses of normal saline.  Final Clinical Impressions(s) / ED Diagnoses   Final diagnoses:  Shoulder pain, right  Fever, unspecified fever cause  Tachycardia    New Prescriptions Current Discharge Medication List       Gareth Morgan, MD 05/14/16 [phone]

## 2016-05-14 LAB — COMPREHENSIVE METABOLIC PANEL
ALBUMIN: 3.4 g/dL — AB (ref 3.5–5.0)
ALK PHOS: 56 U/L (ref 38–126)
ALT: 16 U/L — ABNORMAL LOW (ref 17–63)
ANION GAP: 10 (ref 5–15)
AST: 19 U/L (ref 15–41)
BUN: 6 mg/dL (ref 6–20)
CO2: 29 mmol/L (ref 22–32)
Calcium: 8.2 mg/dL — ABNORMAL LOW (ref 8.9–10.3)
Chloride: 99 mmol/L — ABNORMAL LOW (ref 101–111)
Creatinine, Ser: 0.6 mg/dL — ABNORMAL LOW (ref 0.61–1.24)
GFR calc Af Amer: >60 mL/min (ref >60)
GFR calc non Af Amer: >60 mL/min (ref >60)
GLUCOSE: 89 mg/dL (ref 65–99)
POTASSIUM: 3.2 mmol/L — AB (ref 3.5–5.1)
SODIUM: 138 mmol/L (ref 135–145)
Total Bilirubin: 0.8 mg/dL (ref 0.3–1.2)
Total Protein: 6.7 g/dL (ref 6.5–8.1)

## 2016-05-14 LAB — VITAMIN B12: Vitamin B-12: 1160 pg/mL — ABNORMAL HIGH (ref 180–914)

## 2016-05-14 LAB — URINE CULTURE: Culture: NO GROWTH

## 2016-05-14 LAB — CBC
HEMATOCRIT: 27.5 % — AB (ref 39.0–52.0)
HEMOGLOBIN: 9.4 g/dL — AB (ref 13.0–17.0)
MCH: 35.1 pg — AB (ref 26.0–34.0)
MCHC: 34.2 g/dL (ref 30.0–36.0)
MCV: 102.6 fL — AB (ref 78.0–100.0)
Platelets: 353 10*3/uL (ref 150–400)
RBC: 2.68 MIL/uL — ABNORMAL LOW (ref 4.22–5.81)
RDW: 14.1 % (ref 11.5–15.5)
WBC: 9.5 10*3/uL (ref 4.0–10.5)

## 2016-05-14 LAB — FOLATE: Folate: >100 ng/mL (ref >5.9)

## 2016-05-14 MED ORDER — POTASSIUM CHLORIDE CRYS ER 20 MEQ PO TBCR: 40.0000 meq | EXTENDED_RELEASE_TABLET | Freq: Three times a day (TID) | ORAL | Status: DC

## 2016-05-14 MED ORDER — KETOROLAC TROMETHAMINE 30 MG/ML IJ SOLN
7.5000 mg | Freq: Four times a day (QID) | INTRAMUSCULAR | Status: AC
  Administered 2016-05-14 – 2016-05-15 (×4): 7.5 mg via INTRAVENOUS
  Filled 2016-05-14 (×4): qty 1

## 2016-05-14 MED ORDER — POTASSIUM CHLORIDE CRYS ER 20 MEQ PO TBCR
40.0000 meq | EXTENDED_RELEASE_TABLET | Freq: Three times a day (TID) | ORAL | Status: AC
  Administered 2016-05-14 (×2): 40 meq via ORAL
  Filled 2016-05-14 (×2): qty 2

## 2016-05-14 NOTE — Consult Note (Signed)
Reason for Consult: Right shoulder pain Referring Physician: Hospitalist service, Samuel Henderson is an 55 y.o. male.  HPI: 55 yo male with history of esophageal cancer requiring chemo and radiation therapy.  Known to me for recent treatment of newly diagnosed gout in knee presenting in similar presentation albeit moderately more concerning for infection at the time.  Knee aspirate was negative for infection but positive for intra-cellular crystals - gout Had been doing very well until acute onset right shoulder pain as noted in admitting H&P  Past Medical History:  Diagnosis Date  . Arthritis   . Esophageal cancer ([place])   . Hypertension   . Knee pain     Past Surgical History:  Procedure Laterality Date  . ESOPHAGOGASTRODUODENOSCOPY (EGD) WITH ESOPHAGEAL DILATION     and biopsy  . IRRIGATION AND DEBRIDEMENT KNEE Right 04/14/2016   Procedure: IRRIGATION AND DEBRIDEMENT RIGHT KNEE, ARTHROTOMY;  Surgeon: Samuel Cancel, MD;  Location: [facility];  Service: Orthopedics;  Laterality: Right;  . KNEE SURGERY    . LESION REMOVAL  1969   hip    Family History  Problem Relation Age of Onset  . Esophageal cancer Father   . Cancer Father     nasopharyngeal   . Diabetes Sister   . Cancer Sister     multiple myeloma  . Colon cancer Neg Hx     Social History:  reports that he has been smoking Cigarettes.  He has a 10.00 pack-year smoking history. He has never used smokeless tobacco. He reports that he uses drugs, including Marijuana. He reports that he does not drink alcohol.  Allergies: No Known Allergies  Medications:  I have reviewed the patient's current medications. Scheduled: . enoxaparin (LOVENOX) injection  40 mg Subcutaneous Q24H  . ketorolac  7.5 mg Intravenous Q6H  . pantoprazole  40 mg Oral Daily  . piperacillin-tazobactam (ZOSYN)  IV  3.375 g Intravenous Q8H  . polyethylene glycol  17 g Oral Daily  . potassium chloride  40 mEq Oral TID  . vancomycin  1,250 mg  Intravenous Q12H    Results for orders placed or performed during the hospital encounter of 05/13/16 (from the past 24 hour(s))  CBC with Differential     Status: Abnormal   Collection Time: 05/13/16 10:23 AM  Result Value Ref Range   WBC 14.7 (H) 4.0 - 10.5 K/uL   RBC 3.09 (L) 4.22 - 5.81 MIL/uL   Hemoglobin 10.8 (L) 13.0 - 17.0 g/dL   HCT 31.8 (L) 39.0 - 52.0 %   MCV 102.9 (H) 78.0 - 100.0 fL   MCH 35.0 (H) 26.0 - 34.0 pg   MCHC 34.0 30.0 - 36.0 g/dL   RDW 14.1 11.5 - 15.5 %   Platelets 398 150 - 400 K/uL   Neutrophils Relative % 77 %   Neutro Abs 11.3 (H) 1.7 - 7.7 K/uL   Lymphocytes Relative 11 %   Lymphs Abs 1.7 0.7 - 4.0 K/uL   Monocytes Relative 12 %   Monocytes Absolute 1.7 (H) 0.1 - 1.0 K/uL   Eosinophils Relative 0 %   Eosinophils Absolute 0.0 0.0 - 0.7 K/uL   Basophils Relative 0 %   Basophils Absolute 0.0 0.0 - 0.1 K/uL  Comprehensive metabolic panel     Status: Abnormal   Collection Time: 05/13/16 10:23 AM  Result Value Ref Range   Sodium 130 (L) 135 - 145 mmol/L   Potassium 3.2 (L) 3.5 - 5.1 mmol/L   Chloride 89 (  L) 101 - 111 mmol/L   CO2 30 22 - 32 mmol/L   Glucose, Bld 114 (H) 65 - 99 mg/dL   BUN 10 6 - 20 mg/dL   Creatinine, Ser 0.55 (L) 0.61 - 1.24 mg/dL   Calcium 9.2 8.9 - 10.3 mg/dL   Total Protein 8.3 (H) 6.5 - 8.1 g/dL   Albumin 4.2 3.5 - 5.0 g/dL   AST 30 15 - 41 U/L   ALT 22 17 - 63 U/L   Alkaline Phosphatase 72 38 - 126 U/L   Total Bilirubin 0.5 0.3 - 1.2 mg/dL   GFR calc non Af Amer >60 >60 mL/min   GFR calc Af Amer >60 >60 mL/min   Anion gap 11 5 - 15  Uric acid     Status: None   Collection Time: 05/13/16 10:23 AM  Result Value Ref Range   Uric Acid, Serum 4.6 4.4 - 7.6 mg/dL  Urinalysis, Routine w reflex microscopic (not at [facility])     Status: Abnormal   Collection Time: 05/13/16 10:26 AM  Result Value Ref Range   Color, Urine YELLOW YELLOW   APPearance CLEAR CLEAR   Specific Gravity, Urine 1.014 1.005 - 1.030   pH 6.5 5.0 - 8.0    Glucose, UA NEGATIVE NEGATIVE mg/dL   Hgb urine dipstick TRACE (A) NEGATIVE   Bilirubin Urine NEGATIVE NEGATIVE   Ketones, ur NEGATIVE NEGATIVE mg/dL   Protein, ur NEGATIVE NEGATIVE mg/dL   Nitrite NEGATIVE NEGATIVE   Leukocytes, UA NEGATIVE NEGATIVE  Urine microscopic-add on     Status: Abnormal   Collection Time: 05/13/16 10:26 AM  Result Value Ref Range   Squamous Epithelial / LPF 0-5 (A) NONE SEEN   WBC, UA 0-5 0 - 5 WBC/hpf   RBC / HPF 0-5 0 - 5 RBC/hpf   Bacteria, UA NONE SEEN NONE SEEN   Urine-Other MUCOUS PRESENT   I-Stat CG4 Lactic Acid, ED     Status: None   Collection Time: 05/13/16 12:33 PM  Result Value Ref Range   Lactic Acid, Venous 0.54 0.5 - 1.9 mmol/L  Comprehensive metabolic panel     Status: Abnormal   Collection Time: 05/14/16  6:00 AM  Result Value Ref Range   Sodium 138 135 - 145 mmol/L   Potassium 3.2 (L) 3.5 - 5.1 mmol/L   Chloride 99 (L) 101 - 111 mmol/L   CO2 29 22 - 32 mmol/L   Glucose, Bld 89 65 - 99 mg/dL   BUN 6 6 - 20 mg/dL   Creatinine, Ser 0.60 (L) 0.61 - 1.24 mg/dL   Calcium 8.2 (L) 8.9 - 10.3 mg/dL   Total Protein 6.7 6.5 - 8.1 g/dL   Albumin 3.4 (L) 3.5 - 5.0 g/dL   AST 19 15 - 41 U/L   ALT 16 (L) 17 - 63 U/L   Alkaline Phosphatase 56 38 - 126 U/L   Total Bilirubin 0.8 0.3 - 1.2 mg/dL   GFR calc non Af Amer >60 >60 mL/min   GFR calc Af Amer >60 >60 mL/min   Anion gap 10 5 - 15  CBC     Status: Abnormal   Collection Time: 05/14/16  6:00 AM  Result Value Ref Range   WBC 9.5 4.0 - 10.5 K/uL   RBC 2.68 (L) 4.22 - 5.81 MIL/uL   Hemoglobin 9.4 (L) 13.0 - 17.0 g/dL   HCT 27.5 (L) 39.0 - 52.0 %   MCV 102.6 (H) 78.0 - 100.0 fL   MCH  35.1 (H) 26.0 - 34.0 pg   MCHC 34.2 30.0 - 36.0 g/dL   RDW 14.1 11.5 - 15.5 %   Platelets 353 150 - 400 K/uL    X-ray: CLINICAL DATA:  55 year old male with a history of right shoulder pain  EXAM: RIGHT SHOULDER - 2+ VIEW  COMPARISON:  None.  FINDINGS: No displaced fracture. Glenohumeral joint  appears congruent. Unremarkable scapular Y-view.  Degenerative changes at the acromioclavicular joint.  Right IJ port catheter with intact catheter.  IMPRESSION: Negative for acute bony abnormality.  Degenerative changes of the acromioclavicular joint.  Signed,  Dulcy Fanny. Earleen Newport, DO  [facility]  [facility] Radiology   Electronically Signed   By: Corrie Mckusick D.O.  ROS  History of throat cancer, radiation treatment Recent history of newly diagnosed gout involving knee joint Otherwise pertinent for HPI  Blood pressure (!) 127/110, pulse 82, temperature 99.1 F (37.3 C), temperature source Oral, resp. rate 16, height 5' 9"  (1.753 m), weight 57.1 kg (125 lb 14.1 oz), SpO2 98 %.  Physical Exam  Sitting up comfortably eating breakfast this am Tolerate passive movement of shoulder and demonstrates full active ROM which apparently he was unable to yesterday Right chest wall port site without erythema Knee exam normal  General medical exam reviewed for pertinent findings   Assessment/Plan: Acute right shoulder pain with differential of gout versus septic arthropathy As per my recommendation based on experience with his right knee recently I felt this was most likely a gouty flare.  I recommended toradol which he received one dose Based on response to this based on his subjective reports and objective findings this morning I would continue toradol for today and reassess tomorrow morning Hold any antibiotics to prevent confounding response If continues to improve would send home on prednisone 101m 12 day dose pack and follow up in our office in a couple weeks  Kayce Chismar D 05/14/2016, 8:19 AM

## 2016-05-14 NOTE — Progress Notes (Addendum)
PROGRESS NOTE  [name] [id number] DOB: September 09, 1960 DOA: 05/13/2016 PCP: Donnajean Lopes, MD   LOS: 1 day   Brief Narrative: Samuel Henderson is a 55 y.o. male with medical history significant of metastatic esophageal cancer s/p chemo, HTN, presents to the hospital with few days of right shoulder pain. CXR and right shoulder X-ray with degenerative changes of the ACJ. Patient had 3/4 SIRS with fever, tachycardia and leukocytosis on admission. No obvious source of infection. He has chemo port.   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 3/4 with fever, tachycardia and leukocytosis on arrival. Ortho thinks this is mild gouty flare up vs septic joint based on their experience with his knee recently. - appreciate ortho recs:  -D/c antibiotics.    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 Will continue to monitor. He has been normotensive for most of his stay.  - hold Norvasc for now  Hyponatremia: resolved   Hypokalemia: K 3.2 again this morning - repleting with k-dur 40x3  Anemia: Hgb 10.8 on arrival. MCV 103. 9.4  This morning. Likely dilutional. S/p 2L of NS.  -consider checking folate and Vit B12 level  DVT prophylaxis: lovenox Code Status: full Family Communication: stable and well enough. Disposition Plan: continue inpatient pending ortho input  Consultants:   Ortho  Antimicrobials:  Vanc and Zosyn (9/9>)  Subjective: Reports  improvement in his pain. Slept well overnight. Denies fever, shortness of breath or chest pain.   Objective: Vitals:   05/13/16 1400 05/13/16 1600 05/13/16 2053 05/14/16 0438  BP: 155/84 125/75 124/83 (!) 127/110  Pulse: 108 90 84 82  Resp:  18 17 16   Temp:  98.2 F (36.8 C) 97.7 F (36.5 C) 99.1 F (37.3 C)  TempSrc:  Oral Oral Oral  SpO2: 97% 100% 100% 98%  Weight:  57.1 kg (125 lb 14.1 oz)    Height:  5\' 9"  (1.753 m)      Intake/Output Summary (Last 24 hours) at 05/14/16 0937 Last data filed at 05/13/16 1800  Gross per 24 hour  Intake             2730 ml  Output              650 ml  Net             2080 ml   Filed Weights   05/13/16 1600  Weight: 57.1 kg (125 lb 14.1 oz)    Examination: Constitutional: NAD Vitals:   05/13/16 1400 05/13/16 1600 05/13/16 2053 05/14/16 0438  BP: 155/84 125/75 124/83 (!) 127/110  Pulse: 108 90 84 82  Resp:  18 17 16   Temp:  98.2 F (36.8 C) 97.7 F (36.5 C) 99.1 F (37.3 C)  TempSrc:  Oral Oral Oral  SpO2: 97% 100% 100% 98%  Weight:  57.1 kg (125 lb 14.1 oz)    Height:  5\' 9"  (1.753 m)     Eyes:  PERRL, lids and conjunctivae normal ENMT: Mucous membranes are moist. No oropharyngeal exudates Neck: normal, supple, no masses, no thyromegaly Respiratory: clear to auscultation bilaterally, no wheezing, no crackles. Normal respiratory effort. No accessory muscle use.  Cardiovascular: Regular rate and rhythm, no murmurs / rubs / gallops. No LE edema. 2+ pedal pulses. No carotid bruits.  Abdomen: no tenderness. Bowel sounds positive.  Musculoskeletal: no tenderness to palpation in right shoulder, active motion to 90 degree. Full abduction with passive motion. slight redness superior aspect right shoulder Skin:  slight redness superior aspect right shoulder. No erythema or swelling around his chemo port.  Neurologic: CN 2-12 grossly intact. Strength 5/5 in all 4.  Psychiatric: Normal judgment and insight. Alert and oriented x 3. Normal  mood.    Data Reviewed: I have personally reviewed following labs and imaging studies  CBC:  Recent Labs Lab 05/09/16 1008 05/13/16 1023 05/14/16 0600  WBC 8.6 14.7* 9.5  NEUTROABS 5.3 11.3*  --   HGB 10.8* 10.8* 9.4*  HCT 32.0* 31.8* 27.5*  MCV 103.6* 102.9* 102.6*  PLT 359 398 0000000   Basic Metabolic Panel:  Recent Labs Lab 05/09/16 1008 05/13/16 1023 05/14/16 0600  NA 137 130* 138  K 3.6 3.2* 3.2*  CL  --  89* 99*  CO2 27 30 29   GLUCOSE 110 114* 89  BUN 8.7 10 6   CREATININE 0.7 0.55* 0.60*  CALCIUM 9.3 9.2 8.2*   GFR: Estimated Creatinine Clearance: 83.3 mL/min (by C-G formula based on SCr of 0.8 mg/dL). Liver Function Tests:  Recent Labs Lab 05/09/16 1008 05/13/16 1023 05/14/16 0600  AST 20 30 19   ALT 15 22 16*  ALKPHOS 71 72 56  BILITOT 0.45 0.5 0.8  PROT 7.5 8.3* 6.7  ALBUMIN 3.6 4.2 3.4*   No results for input(s): LIPASE, AMYLASE in the last 168 hours. No results for input(s): AMMONIA in the last 168 hours. Coagulation Profile: No results for input(s): INR, PROTIME in the last 168 hours. Cardiac Enzymes: No results for input(s): CKTOTAL, CKMB, CKMBINDEX, TROPONINI in the last 168 hours. BNP (last 3 results) No results for input(s): PROBNP in the last 8760 hours. HbA1C: No results for input(s): HGBA1C in the last 72 hours. CBG: No results for input(s): GLUCAP in the last 168 hours. Lipid Profile: No results for input(s): CHOL, HDL, LDLCALC, TRIG, CHOLHDL, LDLDIRECT in the last 72 hours. Thyroid Function Tests: No results for input(s): TSH, T4TOTAL, FREET4, T3FREE, THYROIDAB in the last 72 hours. Anemia Panel: No results for input(s): VITAMINB12, FOLATE, FERRITIN, TIBC, IRON, RETICCTPCT in the last 72 hours. Urine analysis:    Component Value Date/Time   COLORURINE YELLOW 05/13/2016 1026   APPEARANCEUR CLEAR 05/13/2016 1026   LABSPEC 1.014 05/13/2016 1026   PHURINE 6.5 05/13/2016 1026   GLUCOSEU NEGATIVE 05/13/2016 1026   HGBUR TRACE (A)  05/13/2016 1026   BILIRUBINUR NEGATIVE 05/13/2016 1026   KETONESUR NEGATIVE 05/13/2016 1026   PROTEINUR NEGATIVE 05/13/2016 1026   NITRITE NEGATIVE 05/13/2016 1026   LEUKOCYTESUR NEGATIVE 05/13/2016 1026   Sepsis Labs: Invalid input(s): PROCALCITONIN, LACTICIDVEN  No results found for this or any previous visit (from the past 240 hour(s)).    Radiology Studies: Dg Chest 2 View  Result Date: 05/13/2016 CLINICAL DATA:  55 year old male with a history of right shoulder pain EXAM: CHEST  2 VIEW COMPARISON:  04/14/2016 FINDINGS: Cardiomediastinal silhouette within normal limits. No confluent airspace disease, pneumothorax, or pleural effusion. Right IJ port catheter with the tip appearing to terminate at  the superior cavoatrial junction. No displaced fracture. Degenerative changes of the bilateral acromioclavicular joint. IMPRESSION: No radiographic evidence of acute cardiopulmonary disease. Right IJ port catheter with the tip appearing to terminate superior cavoatrial junction. Degenerative changes the bilateral acromioclavicular joints. Signed, Dulcy Fanny. Earleen Newport, DO [facility] Radiology Electronically Signed   By: Corrie Mckusick D.O.   On: 05/13/2016 10:32   Dg Shoulder Right  Result Date: 05/13/2016 CLINICAL DATA:  55 year old male with a history of right shoulder pain EXAM: RIGHT SHOULDER - 2+ VIEW COMPARISON:  None. FINDINGS: No displaced fracture. Glenohumeral joint appears congruent. Unremarkable scapular Y-view. Degenerative changes at the acromioclavicular joint. Right IJ port catheter with intact catheter. IMPRESSION: Negative for acute bony abnormality. Degenerative changes of the acromioclavicular joint. Signed, Dulcy Fanny. Earleen Newport, DO [facility] Radiology Electronically Signed   By: Corrie Mckusick D.O.   On: 05/13/2016 10:33     Scheduled Meds: . enoxaparin (LOVENOX) injection  40 mg  Subcutaneous Q24H  . ketorolac  7.5 mg Intravenous Q6H  . pantoprazole  40 mg Oral Daily  . polyethylene glycol  17 g Oral Daily  . potassium chloride  40 mEq Oral TID   Continuous Infusions:    Time spent:    Wendee Beavers, MD FM resident Pager [phone]  If 7PM-7AM, please contact night-coverage [email or web address] Password [facility] 05/14/2016, 9:37 AM

## 2016-05-15 ENCOUNTER — Telehealth: Payer: Self-pay | Admitting: *Deleted

## 2016-05-15 ENCOUNTER — Other Ambulatory Visit: Payer: Self-pay | Admitting: Hematology

## 2016-05-15 DIAGNOSIS — M25511 Pain in right shoulder: Secondary | ICD-10-CM

## 2016-05-15 DIAGNOSIS — C155 Malignant neoplasm of lower third of esophagus: Secondary | ICD-10-CM

## 2016-05-15 MED ORDER — HEPARIN SOD (PORK) LOCK FLUSH 100 UNIT/ML IV SOLN
500.0000 [IU] | INTRAVENOUS | Status: AC | PRN
  Administered 2016-05-15: 500 [IU]
  Filled 2016-05-15: qty 5

## 2016-05-15 MED ORDER — PREDNISONE 5 MG PO TABS: 5.0000 mg | ORAL_TABLET | Freq: Every day | ORAL | 0 refills | Status: DC

## 2016-05-15 MED FILL — predniSONE 5 MG TABS: 5 | 12 days supply | Qty: 12 | Fill #0

## 2016-05-15 NOTE — Progress Notes (Signed)
Discharge instructions reviewed with patient, Rx for Prednisone given. Port a cath flushed and deaccessed per protocol.  Patient transported to front of hospital to be taken home by friend.

## 2016-05-15 NOTE — Telephone Encounter (Signed)
Oncology Nurse Navigator Documentation  Oncology Nurse Navigator Flowsheets 05/15/2016  Navigator Location [facility]  Navigator Encounter Type Telephone  Telephone Appt Confirmation/Clarification  Abnormal Finding Date -  Confirmed Diagnosis Date -  Treatment Initiated Date -  Patient Visit Type -  Treatment Phase -  Barriers/Navigation Needs Coordination of Care---CT scan and referral to radiation oncology  Education -  Interventions Coordination of Care--message to Javata in rad onc to f/u on referral to Dr. Tammi Klippel ordered on 9/5  Referrals -  Coordination of Care Radiology;Appts--scheduled CT 06/05/16 at [facility] 0815/0830-patient made aware of prep and to pick up contrast at [facility] or radiology anytime prior to 10/2.  Education Method -  Support Groups/Services -  Acuity Level 1  Acuity Level 2 -  Time Spent with Patient 15

## 2016-05-15 NOTE — Progress Notes (Signed)
Patient ID: TRAMPUS BAREFIELD, male   DOB: 20-Jun-1961, 55 y.o.   MRN: [id number] Subjective:       Patient reports pain as mild.  Notes  Significant improvement with Toradol.  Nearly 100% better  Objective:   VITALS:   Vitals:   05/14/16 2103 05/15/16 0459  BP: 120/75 136/88  Pulse: 92 79  Resp: 18 18  Temp: 99.4 F (37.4 C) 98.7 F (37.1 C)    Moves right upper extremity actively with out much pain, much better than pre-admission  LABS  Recent Labs  05/13/16 1023 05/14/16 0600  HGB 10.8* 9.4*  HCT 31.8* 27.5*  WBC 14.7* 9.5  PLT 398 353     Recent Labs  05/13/16 1023 05/14/16 0600  NA 130* 138  K 3.2* 3.2*  BUN 10 6  CREATININE 0.55* 0.60*  GLUCOSE 114* 89    No results for input(s): LABPT, INR in the last 72 hours.   Assessment/Plan:     Gouty attack right shoulder joint with near complete resolution with Toradol  Plan: Ok to discharge to home Would give prednisone 5 mg 12 dose pack which is as follows (30mg  for 2 days, 25 mg for 2 days, 20 mg for 2 days, etc Follow up with PCP, schedule earlier than November given proximity of attacks Can see in my office as needed as this appears to be in need of medical management as opposed surgical at this point

## 2016-05-15 NOTE — Discharge Summary (Signed)
Physician Discharge Summary  [name] [id number] DOB: 1960-12-20 DOA: 05/13/2016  PCP: Donnajean Lopes, MD  Admit date: 05/13/2016 Discharge date: 05/15/2016  Admitted From: home Disposition:  home  Recommendations for Outpatient Follow-up:  1. Follow up with Dr. Alvan Dame in 1-2 weeks 2. Follow up with Oncology as scheduled  Home Health: none Equipment/Devices: none  Discharge Condition: stable CODE STATUS: Full Diet recommendation: regular  HPI: Samuel Henderson is a 55 y.o. male with medical history significant of metastatic esophageal cancer, HTN, presents to the hospital with few days of right shoulder pain. Patient has been having mild shoulder aches for months, however over the past 1-2 days he is experiencing excruciating shoulder pain and is unable to sleep at night. His pain is worse with movement. He has denies subjective fever or chills. He was recently hospitalized with fever and right knee pain with concerns for septic knee however and went for washout, cultures have remained negative and it was thought to be non infectious. Of note, he is getting chemotherapy with last cycle ~4-5 weeks ago. He has no other joint pains, his right knee is doing well. He has no chest pain or shortness of breath, no abdominal pain, nausea or vomiting. He has dysphagia in the setting of esophageal malignancy, however this appears to be improved.   ED Course: in the ED patient was febrile to 100.6, HR 101-105, labs pertinent for leukocytosis WBC 14, Na 130, K 3.2. Lactic acid is normal. Shoulder XR negative. Dr. Alvan Dame with orthopedic surgery consulted, [place] asked to admit.   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 I discussed with Dr. 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 Patient's antibiotics were discontinued and he was placed on toradol, with significant improvement in his shoulder pain. On discharge he is pain free, good shoulder ROM without any additional complaints. Since he improved on no antibiotics and on NSAIDs, this unlikely represents septic shoulder. He has no WBC and is afebrile. Will d/c home with prednisone x 12 days as per orthopedic recommendations. Cultures negative to date. 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   amLODipine 5 MG tablet Commonly known as:  NORVASC Take 5 mg by mouth daily.   aspirin 81 MG chewable tablet Chew 1 tablet (81 mg total) by mouth 2 (two) times daily. Take for 2 weeks.   CALCIUM 600+D PO Take 1 tablet by mouth daily.   chlorhexidine 0.12 % solution Commonly known as:  PERIDEX Use as directed 15 mLs in the mouth or throat 2 (two) times daily. Swish and spit.  diclofenac 75 MG EC tablet Commonly known as:  VOLTAREN Take 1 tablet (75 mg total) by mouth 2 (two) times daily.   docusate sodium 100 MG capsule Commonly known as:  COLACE Take 100 mg by mouth daily as needed for mild constipation.   lidocaine-prilocaine cream Commonly known as:  EMLA Apply 1 application topically once. What  changed:  when to take this  reasons to take this   lisinopril-hydrochlorothiazide 10-12.5 MG tablet Commonly known as:  PRINZIDE,ZESTORETIC Take 1 tablet by mouth daily.   LORazepam 0.5 MG tablet Commonly known as:  ATIVAN TAKE 1 TABLET BY MOUTH EVERY 6 HOURS AS NEEDED FOR NAUSEA AND VOMITING   methocarbamol 500 MG tablet Commonly known as:  ROBAXIN Take 500 mg by mouth daily as needed for muscle spasms.   omeprazole 20 MG capsule Commonly known as:  PRILOSEC TAKE ONE CAPSULE BY MOUTH TWICE DAILY BEFORE A MEAL What changed:  See the new instructions.   ondansetron 8 MG tablet Commonly known as:  ZOFRAN Take 1 tablet (8 mg total) by mouth every 8 (eight) hours as needed for nausea or vomiting.   oxyCODONE 5 MG/5ML solution Commonly known as:  ROXICODONE Take 5-10 mLs (5-10 mg total) by mouth every 4 (four) hours as needed for moderate pain or severe pain.   polyethylene glycol packet Commonly known as:  MIRALAX / GLYCOLAX Take 17 g by mouth daily.   predniSONE 5 MG tablet Commonly known as:  DELTASONE Take 1 tablet (5 mg total) by mouth daily.   prochlorperazine 10 MG tablet Commonly known as:  COMPAZINE Take 10 mg by mouth daily as needed for nausea.   senna-docusate 8.6-50 MG tablet Commonly known as:  SENNA S Take 2 tablets by mouth at bedtime.   VIAGRA 100 MG tablet Generic drug:  sildenafil Take 100 mg by mouth daily as needed for erectile dysfunction. Reported on 10/22/2015       No Known Allergies  Consultations:  Orthopedic surgery   Procedures/Studies:  None   Dg Chest 2 View  Result Date: 05/13/2016 CLINICAL DATA:  55 year old male with a history of right shoulder pain EXAM: CHEST  2 VIEW COMPARISON:  04/14/2016 FINDINGS: Cardiomediastinal silhouette within normal limits. No confluent airspace disease, pneumothorax, or pleural effusion. Right IJ port catheter with the tip appearing to terminate at the superior cavoatrial junction. No displaced  fracture. Degenerative changes of the bilateral acromioclavicular joint. IMPRESSION: No radiographic evidence of acute cardiopulmonary disease. Right IJ port catheter with the tip appearing to terminate superior cavoatrial junction. Degenerative changes the bilateral acromioclavicular joints. Signed, Dulcy Fanny. Earleen Newport, DO [facility] Radiology Electronically Signed   By: Corrie Mckusick D.O.   On: 05/13/2016 10:32   Dg Shoulder Right  Result Date: 05/13/2016 CLINICAL DATA:  55 year old male with a history of right shoulder pain EXAM: RIGHT SHOULDER - 2+ VIEW COMPARISON:  None. FINDINGS: No displaced fracture. Glenohumeral joint appears congruent. Unremarkable scapular Y-view. Degenerative changes at the acromioclavicular joint. Right IJ port catheter with intact catheter. IMPRESSION: Negative for acute bony abnormality. Degenerative changes of the acromioclavicular joint. Signed, Dulcy Fanny. Earleen Newport, DO [facility] Radiology Electronically Signed   By: Corrie Mckusick D.O.   On: 05/13/2016 10:33      Subjective: - no chest pain, shortness of breath, no abdominal pain, nausea or vomiting.    Discharge Exam: Vitals:   05/14/16 2103 05/15/16 0459  BP: 120/75 136/88  Pulse: 92 79  Resp: 18 18  Temp: 99.4 F (37.4 C) 98.7 F (37.1 C)   Vitals:   05/14/16 0438 05/14/16 1325 05/14/16 2103 05/15/16 0459  BP: (!) 127/110 140/87 120/75 136/88  Pulse: 82 91 92 79  Resp: 16 16 18 18   Temp: 99.1 F (37.3 C) 99.3 F (37.4 C) 99.4 F (37.4 C) 98.7 F (37.1 C)  TempSrc: Oral Oral Oral Oral  SpO2: 98% 100% 99% 100%  Weight:      Height:        General: Pt is alert, awake, not in acute distress Cardiovascular: RRR, S1/S2 +, no rubs, no gallops Respiratory: CTA bilaterally, no wheezing, no rhonchi MSK: full ROM right shoulder, no tenderness with palpation    The results of significant diagnostics from  this hospitalization (including imaging, microbiology, ancillary and laboratory) are listed below for reference.     Microbiology: Recent Results (from the past 240 hour(s))  Blood culture (routine x 2)     Status: None (Preliminary result)   Collection Time: 05/13/16 10:24 AM  Result Value Ref Range Status   Specimen Description BLOOD RIGHT ANTECUBITAL  Final   Special Requests BOTTLES DRAWN AEROBIC AND ANAEROBIC 5 CC EACH  Final   Culture   Final    NO GROWTH < 24 HOURS Performed at [facility]    Report Status PENDING  Incomplete  Blood culture (routine x 2)     Status: None (Preliminary result)   Collection Time: 05/13/16 10:24 AM  Result Value Ref Range Status   Specimen Description BLOOD RIGHT HAND  Final   Special Requests IN PEDIATRIC BOTTLE 3 CC EACH  Final   Culture   Final    NO GROWTH < 24 HOURS Performed at [facility]    Report Status PENDING  Incomplete  Urine culture     Status: None   Collection Time: 05/13/16 10:28 AM  Result Value Ref Range Status   Specimen Description URINE, RANDOM  Final   Special Requests NONE  Final   Culture NO GROWTH Performed at [facility]   Final   Report Status 05/14/2016 FINAL  Final  Culture, blood (single)     Status: None (Preliminary result)   Collection Time: 05/13/16  4:25 PM  Result Value Ref Range Status   Specimen Description BLOOD PORTA CATH  Final   Special Requests   Final    BOTTLES DRAWN AEROBIC AND ANAEROBIC 5CC EACH BOTTLE   Culture   Final    NO GROWTH < 24 HOURS Performed at [facility]    Report Status PENDING  Incomplete     Labs: BNP (last 3 results) No results for input(s): BNP in the last 8760 hours. Basic Metabolic Panel:  Recent Labs Lab 05/09/16 1008 05/13/16 1023 05/14/16 0600  NA 137 130* 138  K 3.6 3.2* 3.2*  CL  --  89* 99*  CO2 27 30 29   GLUCOSE 110 114* 89  BUN 8.7 10 6   CREATININE 0.7 0.55* 0.60*  CALCIUM 9.3 9.2 8.2*   Liver Function  Tests:  Recent Labs Lab 05/09/16 1008 05/13/16 1023 05/14/16 0600  AST 20 30 19   ALT 15 22 16*  ALKPHOS 71 72 56  BILITOT 0.45 0.5 0.8  PROT 7.5 8.3* 6.7  ALBUMIN 3.6 4.2 3.4*   No results for input(s): LIPASE, AMYLASE in the last 168 hours. No results for input(s): AMMONIA in the last 168 hours. CBC:  Recent Labs Lab 05/09/16 1008 05/13/16 1023  05/14/16 0600  WBC 8.6 14.7* 9.5  NEUTROABS 5.3 11.3*  --   HGB 10.8* 10.8* 9.4*  HCT 32.0* 31.8* 27.5*  MCV 103.6* 102.9* 102.6*  PLT 359 398 353   Cardiac Enzymes: No results for input(s): CKTOTAL, CKMB, CKMBINDEX, TROPONINI in the last 168 hours. BNP: Invalid input(s): POCBNP CBG: No results for input(s): GLUCAP in the last 168 hours. D-Dimer No results for input(s): DDIMER in the last 72 hours. Hgb A1c No results for input(s): HGBA1C in the last 72 hours. Lipid Profile No results for input(s): CHOL, HDL, LDLCALC, TRIG, CHOLHDL, LDLDIRECT in the last 72 hours. Thyroid function studies No results for input(s): TSH, T4TOTAL, T3FREE, THYROIDAB in the last 72 hours.  Invalid input(s): FREET3 Anemia work up  Recent Labs  05/14/16 0940  VITAMINB12 1,160*  FOLATE >100.0   Urinalysis    Component Value Date/Time   COLORURINE YELLOW 05/13/2016 [place] 05/13/2016 1026   LABSPEC 1.014 05/13/2016 1026   PHURINE 6.5 05/13/2016 1026   GLUCOSEU NEGATIVE 05/13/2016 1026   HGBUR TRACE (A) 05/13/2016 1026   BILIRUBINUR NEGATIVE 05/13/2016 1026   KETONESUR NEGATIVE 05/13/2016 1026   PROTEINUR NEGATIVE 05/13/2016 1026   NITRITE NEGATIVE 05/13/2016 1026   LEUKOCYTESUR NEGATIVE 05/13/2016 1026   Sepsis Labs Invalid input(s): PROCALCITONIN,  WBC,  LACTICIDVEN Microbiology Recent Results (from the past 240 hour(s))  Blood culture (routine x 2)     Status: None (Preliminary result)   Collection Time: 05/13/16 10:24 AM  Result Value Ref Range Status   Specimen Description BLOOD RIGHT ANTECUBITAL  Final    Special Requests BOTTLES DRAWN AEROBIC AND ANAEROBIC 5 CC EACH  Final   Culture   Final    NO GROWTH < 24 HOURS Performed at [facility]    Report Status PENDING  Incomplete  Blood culture (routine x 2)     Status: None (Preliminary result)   Collection Time: 05/13/16 10:24 AM  Result Value Ref Range Status   Specimen Description BLOOD RIGHT HAND  Final   Special Requests IN PEDIATRIC BOTTLE 3 CC EACH  Final   Culture   Final    NO GROWTH < 24 HOURS Performed at [facility]    Report Status PENDING  Incomplete  Urine culture     Status: None   Collection Time: 05/13/16 10:28 AM  Result Value Ref Range Status   Specimen Description URINE, RANDOM  Final   Special Requests NONE  Final   Culture NO GROWTH Performed at [facility]   Final   Report Status 05/14/2016 FINAL  Final  Culture, blood (single)     Status: None (Preliminary result)   Collection Time: 05/13/16  4:25 PM  Result Value Ref Range Status   Specimen Description BLOOD PORTA CATH  Final   Special Requests   Final    BOTTLES DRAWN AEROBIC AND ANAEROBIC 5CC EACH BOTTLE   Culture   Final    NO GROWTH < 24 HOURS Performed at [facility]    Report Status PENDING  Incomplete     Time coordinating discharge: Over 30 minutes  SIGNED:  Marzetta Board, MD  [facility] 05/15/2016, 8:45 AM Pager [phone]  If 7PM-7AM, please contact night-coverage [email or web address] Password [email or web address]

## 2016-05-17 ENCOUNTER — Encounter: Payer: Self-pay | Admitting: Radiation Oncology

## 2016-05-17 NOTE — Progress Notes (Signed)
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 Presently, taking a break from chemotherapy due recover from fatigue   Pain on a scale of 0-10 is: Presently low back pain is a 4 on a scale of 0-10 but patient reports this pain is much worse at night. Reports taking oxycodone 5 mg solution to manage pain     If Spine Met(s), symptoms, if any, include:  Bowel/Bladder retention or incontinence (please describe): taking colace to avoid constipation associated with pain medication but, denies any bowel or bladder complaints  Numbness or weakness in extremities (please describe): reports numbness in the bottom of both feet. Patient has a history of chemotherapy  Current Decadron regimen, if applicable: patient is on second day of 12 day prednisone 5 mg taper for gout in right shoulder and right knee.  Ambulatory status? Walker? Wheelchair?: Ambulatory  SAFETY ISSUES:  Prior radiation? yes  Pacemaker/ICD? no  Possible current pregnancy? no  Is the patient on methotrexate? no  Current Complaints / other details:  55 year old. Single. Reports dry mouth and difficulty swallowing meats and bread continue s/p xrt.

## 2016-05-18 ENCOUNTER — Ambulatory Visit
Admission: RE | Admit: 2016-05-18 | Discharge: 2016-05-18 | Disposition: A | Discharge status: Home / Self Care | Payer: 59 | Source: Ambulatory Visit | Attending: Radiation Oncology | Admitting: Radiation Oncology

## 2016-05-18 ENCOUNTER — Encounter: Payer: Self-pay | Admitting: Radiation Oncology

## 2016-05-18 VITALS — BP 136/89 | HR 79 | Resp 16 | Ht 69.0 in | Wt 132.5 lb

## 2016-05-18 DIAGNOSIS — C7951 Secondary malignant neoplasm of bone: Secondary | ICD-10-CM

## 2016-05-18 DIAGNOSIS — Z7982 Long term (current) use of aspirin: Secondary | ICD-10-CM | POA: Diagnosis not present

## 2016-05-18 DIAGNOSIS — M1990 Unspecified osteoarthritis, unspecified site: Secondary | ICD-10-CM | POA: Diagnosis not present

## 2016-05-18 DIAGNOSIS — C787 Secondary malignant neoplasm of liver and intrahepatic bile duct: Secondary | ICD-10-CM | POA: Insufficient documentation

## 2016-05-18 DIAGNOSIS — Z800 Family history of malignant neoplasm of digestive organs: Secondary | ICD-10-CM | POA: Diagnosis not present

## 2016-05-18 DIAGNOSIS — M109 Gout, unspecified: Secondary | ICD-10-CM | POA: Diagnosis not present

## 2016-05-18 DIAGNOSIS — Z833 Family history of diabetes mellitus: Secondary | ICD-10-CM | POA: Insufficient documentation

## 2016-05-18 DIAGNOSIS — C155 Malignant neoplasm of lower third of esophagus: Secondary | ICD-10-CM | POA: Diagnosis not present

## 2016-05-18 DIAGNOSIS — Z9221 Personal history of antineoplastic chemotherapy: Secondary | ICD-10-CM | POA: Insufficient documentation

## 2016-05-18 DIAGNOSIS — Z923 Personal history of irradiation: Secondary | ICD-10-CM | POA: Diagnosis not present

## 2016-05-18 DIAGNOSIS — C159 Malignant neoplasm of esophagus, unspecified: Secondary | ICD-10-CM

## 2016-05-18 DIAGNOSIS — G893 Neoplasm related pain (acute) (chronic): Secondary | ICD-10-CM | POA: Diagnosis not present

## 2016-05-18 DIAGNOSIS — Z807 Family history of other malignant neoplasms of lymphoid, hematopoietic and related tissues: Secondary | ICD-10-CM | POA: Diagnosis not present

## 2016-05-18 DIAGNOSIS — F17210 Nicotine dependence, cigarettes, uncomplicated: Secondary | ICD-10-CM | POA: Insufficient documentation

## 2016-05-18 DIAGNOSIS — I10 Essential (primary) hypertension: Secondary | ICD-10-CM | POA: Insufficient documentation

## 2016-05-18 HISTORY — DX: Personal history of antineoplastic chemotherapy: Z92.21

## 2016-05-18 HISTORY — DX: Personal history of irradiation: Z92.3

## 2016-05-18 HISTORY — DX: Malignant neoplasm of bone and articular cartilage, unspecified: C41.9

## 2016-05-18 LAB — CULTURE, BLOOD (ROUTINE X 2)
CULTURE: NO GROWTH
Culture: NO GROWTH

## 2016-05-18 LAB — CULTURE, BLOOD (SINGLE): Culture: NO GROWTH

## 2016-05-18 NOTE — Progress Notes (Signed)
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  No diagnosis found.  Interval Since Last Radiation:  7 months (10/12/2015-10/22/2015)   The distal esophagus was treated to 30 Gy in 10 fractions of 3 Gy.  Narrative:  The patient returns today for re-consult. He is here to discuss the role for radiation treatment in the management of his newly noted bony metastases.    Bone scan on 04/06/2016 shows increased uptake in T1 vertebral body, L3 vertebral body, right femoral diaphysis and right upper sacrum question osseous metastases. Increased uptake in the left lateral L5, with degenerative disc and facet disease changes present at this site on prior CT. Increased uptake lateral right first rib corresponding to healing fracture on CT. CT C/A/P on 04/10/2016 shows hepatic metastatic disease is stable to minimally improved. Gastrohepatic ligament adenopathy, decreased in size. T1 metastasis, as before. Additional lesions in L3 vertebral body and right sacrum, corresponding to abnormalities on 04/06/2016 and worrisome for metastatic disease.                              On review of systems, the patient reports that he is doing well overall. He denies any chest pain, shortness of breath, cough, fevers, chills, night sweats, unintended weight changes. He reports dry mouth and difficulty swallowing meats and bread continue status post radiation treatment. He denies any bowel or bladder disturbances, and denies abdominal pain, nausea or vomiting. Taking Colace to prevent constipation associated with pain medication. He denies any urinary or bowel incontinence. Presently low back pain is a 4 on a scale of 0-10  but patient reports this pain is much worse at night. Patient localizes pain to the low back/hip near the waistline. Reports taking oxycodone 5 mg solution to manage pain. If he does not take this pain medication he is unable to sleep due to pain. Reports intermittent numbness in the bottom of both feet. Patient has a history of chemotherapy. He denies inguinal or chest numbness, or numbness shooting down his legs. Patient is on second day of 12 day prednisone 5 mg taper for gout in right shoulder and right knee. The patient's mother reports he has lost weight. He has met with nutrition. A complete review of systems is obtained and is otherwise negative.  Past Medical History:  Past Medical History:  Diagnosis Date  . Arthritis   . Bone cancer ([facility])   . Esophageal cancer ([place])   . History of chemotherapy   . History of radiation therapy   . Hypertension   . Knee pain     Past Surgical History: Past Surgical History:  Procedure Laterality Date  . ESOPHAGOGASTRODUODENOSCOPY (EGD) WITH ESOPHAGEAL DILATION     and biopsy  . IRRIGATION AND DEBRIDEMENT KNEE Right 04/14/2016   Procedure: IRRIGATION AND DEBRIDEMENT RIGHT KNEE, ARTHROTOMY;  Surgeon: Paralee Cancel, MD;  Location: [facility];  Service: Orthopedics;  Laterality: Right;  . KNEE SURGERY    . LESION REMOVAL  1969   hip    Social History:  Social History   Social History  . Marital status: Single    Spouse name: N/A  .  Number of children: 2  . Years of education: N/A   Occupational History  . Manufacturing industrial curtains    Social History Main Topics  . Smoking status: Current Every Day Smoker    Packs/day: 0.50    Years: 20.00    Types: Cigarettes  . Smokeless tobacco: Never Used  . Alcohol use No  . Drug use:     Types: Marijuana     Comment: last smoked 1 week ago  . Sexual activity: Yes   Other Topics Concern  . 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 Esophageal cancer Father   . Cancer Father     nasopharyngeal   . Diabetes Sister   . Cancer Sister     multiple myeloma  . Colon cancer Neg Hx     ALLERGIES:  has No Known Allergies.  Meds: Current Outpatient Prescriptions  Medication Sig Dispense Refill  . amLODipine (NORVASC) 5 MG tablet Take 5 mg by mouth daily.    . Calcium Carbonate-Vitamin D (CALCIUM 600+D PO) Take 1 tablet by mouth daily.    . chlorhexidine (PERIDEX) 0.12 % solution Use as directed 15 mLs in the mouth or throat 2 (two) times daily. Swish and spit. 120 mL 0  . lidocaine-prilocaine (EMLA) cream Apply 1 application topically once. (Patient taking differently: Apply 1 application topically daily as needed (for port access). ) 30 g 1  . lisinopril-hydrochlorothiazide (PRINZIDE,ZESTORETIC) 10-12.5 MG tablet Take 1 tablet by mouth daily.  3  . LORazepam (ATIVAN) 0.5 MG tablet TAKE 1 TABLET BY MOUTH EVERY 6 HOURS AS NEEDED FOR NAUSEA AND VOMITING 30 tablet 0  . methocarbamol (ROBAXIN) 500 MG tablet Take 500 mg by mouth daily as needed for muscle spasms.     [name] Kitchen omeprazole (PRILOSEC) 20 MG capsule TAKE ONE CAPSULE BY MOUTH TWICE DAILY BEFORE A MEAL (Patient taking differently: TAKE 26m BY MOUTH TWICE DAILY BEFORE A MEAL when needed for acid reflux/ heartburn) 60 capsule 0  . ondansetron (ZOFRAN) 8 MG tablet Take 1 tablet (8 mg total) by mouth every 8 (eight) hours as needed for nausea or vomiting. 30 tablet 3  . oxyCODONE (ROXICODONE) 5 MG/5ML solution Take 5-10 mLs (5-10 mg total) by mouth every 4 (four) hours as needed for moderate pain or severe pain. 473 mL 0  . predniSONE (DELTASONE) 5 MG tablet Take 1 tablet (5 mg total) by mouth daily. 12 tablet 0  . VIAGRA 100 MG tablet Take 100 mg by mouth daily as needed  for erectile dysfunction. Reported on 10/22/2015    . acetaminophen (TYLENOL) 500 MG tablet Take 1,000 mg by mouth every 6 (six) hours as needed for moderate pain or headache.    .[name] Kitchenaspirin 81 MG chewable tablet Chew 1 tablet (81 mg total) by mouth 2 (two) times daily. Take for 2 weeks. (Patient not taking: Reported on 05/18/2016) 14 tablet 0  . diclofenac (VOLTAREN) 75 MG EC tablet Take 1 tablet (75 mg total) by mouth 2 (two) times daily. (Patient not taking: Reported on 05/18/2016) 40 tablet 0  . docusate sodium (COLACE) 100 MG capsule Take 100 mg by mouth daily as needed for mild constipation.    .[name] Kitchen  polyethylene glycol (MIRALAX / GLYCOLAX) packet Take 17 g by mouth daily. (Patient not taking: Reported on 05/18/2016) 30 each 0  . prochlorperazine (COMPAZINE) 10 MG tablet Take 10 mg by mouth daily as needed for nausea.     [name] Kitchen senna-docusate (SENNA S) 8.6-50 MG tablet Take 2 tablets by mouth at bedtime. (Patient not taking: Reported on 05/18/2016) 60 tablet 1   No current facility-administered medications for this encounter.     Physical Findings:  height is 5' 9"  (1.753 m) and weight is 132 lb 8 oz (60.1 kg). His blood pressure is 136/89 and his pulse is 79. His respiration is 16 and oxygen saturation is 100%. .    In general this is a well appearing African American male in no acute distress. He is alert and oriented x4 and appropriate throughout the examination. HEENT reveals that the patient is normocephalic, atraumatic. EOMs are intact. PERRLA. Skin is intact without any evidence of gross lesions. Cardiovascular exam reveals a regular rate and rhythm, no clicks rubs or murmurs are auscultated. Chest is clear to auscultation bilaterally. Lymphatic assessment is performed and does not reveal any adenopathy in the cervical, supraclavicular, axillary, or inguinal chains. He has palpable pain along the lumbosacral spine. He has some curvature consistent with scoliosis as well. Abdomen has active bowel sounds  in all quadrants and is intact. The abdomen is soft, non tender, non distended. Lower extremities are negative for pretibial pitting edema, deep calf tenderness, cyanosis or clubbing. His is intact grossly from a neurologic perspective with intact bilateral lower extremity strength 5/5, and no difficulty with gait disturbance, or with sensory disturbance with light touch intact.   Lab Findings: Lab Results  Component Value Date   WBC 9.5 05/14/2016   HGB 9.4 (L) 05/14/2016   HGB 10.8 (L) 05/09/2016   HCT 27.5 (L) 05/14/2016   HCT 32.0 (L) 05/09/2016   PLT 353 05/14/2016   PLT 359 05/09/2016    Lab Results  Component Value Date   NA 138 05/14/2016   NA 137 05/09/2016   K 3.2 (L) 05/14/2016   K 3.6 05/09/2016   CHLORIDE 99 05/09/2016   CO2 29 05/14/2016   CO2 27 05/09/2016   GLUCOSE 89 05/14/2016   GLUCOSE 110 05/09/2016   BUN 6 05/14/2016   BUN 8.7 05/09/2016   CREATININE 0.60 (L) 05/14/2016   CREATININE 0.7 05/09/2016   BILITOT 0.8 05/14/2016   BILITOT 0.45 05/09/2016   ALKPHOS 56 05/14/2016   ALKPHOS 71 05/09/2016   AST 19 05/14/2016   AST 20 05/09/2016   ALT 16 (L) 05/14/2016   ALT 15 05/09/2016   PROT 6.7 05/14/2016   PROT 7.5 05/09/2016   ALBUMIN 3.4 (L) 05/14/2016   ALBUMIN 3.6 05/09/2016   CALCIUM 8.2 (L) 05/14/2016   CALCIUM 9.3 05/09/2016   ANIONGAP 10 05/14/2016    Radiographic Findings: Dg Chest 2 View  Result Date: 05/13/2016 CLINICAL DATA:  55 year old male with a history of right shoulder pain EXAM: CHEST  2 VIEW COMPARISON:  04/14/2016 FINDINGS: Cardiomediastinal silhouette within normal limits. No confluent airspace disease, pneumothorax, or pleural effusion. Right IJ port catheter with the tip appearing to terminate at the superior cavoatrial junction. No displaced fracture. Degenerative changes of the bilateral acromioclavicular joint. IMPRESSION: No radiographic evidence of acute cardiopulmonary disease. Right IJ port catheter with the tip appearing  to terminate superior cavoatrial junction. Degenerative changes the bilateral acromioclavicular joints. Signed, Dulcy Fanny. Earleen Newport, DO [facility] Electronically  Signed   By: Corrie Mckusick D.O.   On: 05/13/2016 10:32   Dg Shoulder Right  Result Date: 05/13/2016 CLINICAL DATA:  55 year old male with a history of right shoulder pain EXAM: RIGHT SHOULDER - 2+ VIEW COMPARISON:  None. FINDINGS: No displaced fracture. Glenohumeral joint appears congruent. Unremarkable scapular Y-view. Degenerative changes at the acromioclavicular joint. Right IJ port catheter with intact catheter. IMPRESSION: Negative for acute bony abnormality. Degenerative changes of the acromioclavicular joint. Signed, Dulcy Fanny. Earleen Newport, DO [facility] Electronically Signed   By: Corrie Mckusick D.O.   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 He complains of pain in the lumbar region. He has no neurological complications at this time. He may benefit from pall rad to the lumber spine to reduce pain and preserve neurologic function.   Plan: Dr. Tammi Klippel spoke with the paitent about the findings and work-up thus far.  We discussed the nature of painful spine metastasis and general treatment, highlighting the role of radiotherapy in the management.  We discussed the available radiation techniques, and focused on the details of logistics and delivery.  We reviewed the anticipated acute and late sequelae associated with radiation in this setting. Dr. Tammi Klippel recommends 30 Gy in 10 fractions to the lumbar and sacral spine. The patient would like to proceed with radiation and has been scheduled for CT simulation on Monday, 9/18 at 11 am. I also spoke with the patient about palliative care and supportive resources that are available.  The patient was given printed information about palliative care. The patient will consider this option and will contact our office if he would like a referral.  The above documentation reflects my direct findings during this shared patient visit. Please see the separate note by Dr. Tammi Klippel on this date for the remainder of the patient's plan of care.     Carola Rhine, PAC    This document serves as a record of services personally performed by [name], MD and Shona Simpson, PAC. It was created on his behalf by Arlyce Harman, a trained medical scribe. The creation of this record is based on the scribe's personal observations and the provider's statements to them. This document has been checked and approved by the attending provider.

## 2016-05-18 NOTE — Progress Notes (Signed)
See progress note under physician encounter. 

## 2016-05-22 ENCOUNTER — Telehealth: Payer: Self-pay | Admitting: *Deleted

## 2016-05-22 ENCOUNTER — Ambulatory Visit
Admission: RE | Admit: 2016-05-22 | Discharge: 2016-05-22 | Disposition: A | Discharge status: Home / Self Care | Payer: 59 | Source: Ambulatory Visit | Attending: Radiation Oncology | Admitting: Radiation Oncology

## 2016-05-22 DIAGNOSIS — C7951 Secondary malignant neoplasm of bone: Secondary | ICD-10-CM

## 2016-05-22 DIAGNOSIS — C155 Malignant neoplasm of lower third of esophagus: Secondary | ICD-10-CM | POA: Diagnosis not present

## 2016-05-22 NOTE — Telephone Encounter (Signed)
Oncology Nurse Navigator Documentation  Oncology Nurse Navigator Flowsheets 05/22/2016  Navigator Location [facility]  Navigator Encounter Type Telephone  Telephone Incoming Call;Appt Confirmation/Clarification--mom called to inquire where he can pick up his scan contrast.  Abnormal Finding Date -  Confirmed Diagnosis Date -  Treatment Initiated Date -  Patient Visit Type -  Treatment Phase -  Barriers/Navigation Needs Coordination of Care--Contrast for scan  Education -  Interventions Other--informed his mother that navigator will put contrast at front reception desk to pick up today when here to see Dr. Tammi Klippel. Drink at 0630 and 0730 am of scan.  Referrals -  Coordination of Care Radiology  Education Method -  Support Groups/Services -  Acuity -  Acuity Level 2 -  Time Spent with Patient 15

## 2016-05-22 NOTE — Progress Notes (Signed)
  Radiation Oncology         [phone]) [phone] ________________________________  Name: Samuel Henderson MRN: [id number]  Date: 05/22/2016  DOB: 1960/09/19  SIMULATION AND TREATMENT PLANNING NOTE    ICD-9-CM ICD-10-CM   1. 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  Identity was confirmed.  All relevant records and images related to the planned course of therapy were reviewed.  The patient freely provided informed written consent to proceed with treatment after reviewing the details related to the planned course of therapy. The consent form was witnessed and verified by the simulation staff.  Then, the patient was set-up in a stable reproducible  supine position for radiation therapy.  CT images were obtained.  Surface markings were placed.  The CT images were loaded into the planning software.  Then the target and avoidance structures were contoured.  Treatment planning then occurred.  The radiation prescription was entered and confirmed.  Then, I designed and supervised the construction of a total of 3 medically necessary complex treatment devices. With BodyFix and 2 MLCs to shield kidneys  I have requested : 3D Simulation  I have requested a DVH of the following structures: Left Kidney, Right Kidney, spinal cord, and bowel.  I have ordered:Nutrition Consult  PLAN:  The patient will receive 30 Gy in 10 fraction.  ________________________________  Sheral Apley Tammi Klippel, M.D.

## 2016-05-23 DIAGNOSIS — C155 Malignant neoplasm of lower third of esophagus: Secondary | ICD-10-CM | POA: Diagnosis not present

## 2016-05-24 NOTE — Addendum Note (Signed)
Encounter addended by: Heywood Footman, RN on: 05/24/2016  9:57 AM<BR>    Actions taken: Charge Capture section accepted

## 2016-05-29 ENCOUNTER — Ambulatory Visit
Admission: RE | Admit: 2016-05-29 | Discharge: 2016-05-29 | Disposition: A | Discharge status: Home / Self Care | Payer: 59 | Source: Ambulatory Visit | Attending: Radiation Oncology | Admitting: Radiation Oncology

## 2016-05-29 DIAGNOSIS — C155 Malignant neoplasm of lower third of esophagus: Secondary | ICD-10-CM | POA: Diagnosis not present

## 2016-05-29 NOTE — Progress Notes (Signed)
Samuel Henderson    HEMATOLOGY/ONCOLOGY CLINIC NOTE  Date of Service: .05/09/2016    Patient Care Team: Leanna Battles, MD as PCP - General (Internal Medicine)  CHIEF COMPLAINTS/PURPOSE OF CONSULTATION:  Metastatic Esophageal Squamous cell carcinoma   HISTORY OF PRESENTING ILLNESS: plz see my initial consultation regarding patients initial presentation  INTERVAL HISTORY  Samuel Henderson is here for his scheduled follow-up after completion of his planned chemotherapy . He notes an increase your back pain where he previously had some skeletal lesions. He was admitted to the hospital in mid-August after our last visit for what appears to be a acute gouty arthritis in the knee . Required right knee I&D was empirically treated with antibiotics for possible infection though cultures were unrevealing and he has completed his antibiotics . No SOB. No abdominal pain. His fatigue from chemotherapy has improved. Grade 1 neuropathy stable/improving. At this time his predominant symptom is his low back pain and he is okay with pursuing focal palliative radiation therapy to his spine to help with symptom control pending additional considerations for palliative chemotherapy. Mother present for this clinic visit.  MEDICAL HISTORY:  Past Medical History:  Diagnosis Date  . Arthritis   . Bone cancer ([place])   . Esophageal cancer ([place])   . History of chemotherapy   . History of radiation therapy   . Hypertension   . Knee pain     SURGICAL HISTORY: Past Surgical History:  Procedure Laterality Date  . ESOPHAGOGASTRODUODENOSCOPY (EGD) WITH ESOPHAGEAL DILATION     and biopsy  . IRRIGATION AND DEBRIDEMENT KNEE Right 04/14/2016   Procedure: IRRIGATION AND DEBRIDEMENT RIGHT KNEE, ARTHROTOMY;  Surgeon: Paralee Cancel, MD;  Location: [facility];  Service: Orthopedics;  Laterality: Right;  . KNEE SURGERY    . LESION REMOVAL  1969   hip    SOCIAL HISTORY: Social History   Social History  . Marital status: Single   Spouse name: N/A  . Number of children: 2  . Years of education: N/A   Occupational History  . Manufacturing industrial curtains    Social History Main Topics  . Smoking status: Current Every Day Smoker    Packs/day: 0.50    Years: 20.00    Types: Cigarettes  . Smokeless tobacco: Never Used  . Alcohol use No  . Drug use:     Types: Marijuana     Comment: last smoked 1 week ago  . Sexual activity: Yes   Other Topics Concern  . 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 Sober for 15 yrs  Ex smoker 1/2 PPD x 64yr quit about 18 yrs ago.  FAMILY HISTORY: Family History  Problem Relation Age of Onset  . Esophageal cancer Father   . Cancer Father     nasopharyngeal   . Diabetes Sister   . Cancer Sister     multiple myeloma  . Colon cancer Neg Hx     ALLERGIES:  has No Known Allergies.  MEDICATIONS:  Current Outpatient Prescriptions  Medication Sig Dispense Refill  . acetaminophen (TYLENOL) 500 MG tablet Take 1,000 mg by mouth every 6 (six) hours as needed for moderate pain or headache.    .Samuel KitchenamLODipine (  NORVASC) 5 MG tablet Take 5 mg by mouth daily.    Samuel Henderson aspirin 81 MG chewable tablet Chew 1 tablet (81 mg total) by mouth 2 (two) times daily. Take for 2 weeks. (Patient not taking: Reported on 05/18/2016) 14 tablet 0  . Calcium Carbonate-Vitamin D (CALCIUM 600+D PO) Take 1 tablet by mouth daily.    . chlorhexidine (PERIDEX) 0.12 % solution Use as directed 15 mLs in the mouth or throat 2 (two) times daily. Swish and spit. 120 mL 0  . diclofenac (VOLTAREN) 75 MG EC tablet Take 1 tablet (75 mg total) by mouth 2 (two) times daily. (Patient not taking: Reported on 05/18/2016) 40 tablet 0  . docusate sodium (COLACE) 100 MG capsule  Take 100 mg by mouth daily as needed for mild constipation.    . lidocaine-prilocaine (EMLA) cream Apply 1 application topically once. (Patient taking differently: Apply 1 application topically daily as needed (for port access). ) 30 g 1  . lisinopril-hydrochlorothiazide (PRINZIDE,ZESTORETIC) 10-12.5 MG tablet Take 1 tablet by mouth daily.  3  . LORazepam (ATIVAN) 0.5 MG tablet TAKE 1 TABLET BY MOUTH EVERY 6 HOURS AS NEEDED FOR NAUSEA AND VOMITING 30 tablet 0  . methocarbamol (ROBAXIN) 500 MG tablet Take 500 mg by mouth daily as needed for muscle spasms.     Samuel Henderson omeprazole (PRILOSEC) 20 MG capsule TAKE ONE CAPSULE BY MOUTH TWICE DAILY BEFORE A MEAL (Patient taking differently: TAKE 9m BY MOUTH TWICE DAILY BEFORE A MEAL when needed for acid reflux/ heartburn) 60 capsule 0  . ondansetron (ZOFRAN) 8 MG tablet Take 1 tablet (8 mg total) by mouth every 8 (eight) hours as needed for nausea or vomiting. 30 tablet 3  . oxyCODONE (ROXICODONE) 5 MG/5ML solution Take 5-10 mLs (5-10 mg total) by mouth every 4 (four) hours as needed for moderate pain or severe pain. 473 mL 0  . polyethylene glycol (MIRALAX / GLYCOLAX) packet Take 17 g by mouth daily. (Patient not taking: Reported on 05/18/2016) 30 each 0  . predniSONE (DELTASONE) 5 MG tablet Take 1 tablet (5 mg total) by mouth daily. 12 tablet 0  . prochlorperazine (COMPAZINE) 10 MG tablet Take 10 mg by mouth daily as needed for nausea.     .Samuel Kitchensenna-docusate (SENNA S) 8.6-50 MG tablet Take 2 tablets by mouth at bedtime. (Patient not taking: Reported on 05/18/2016) 60 tablet 1  . VIAGRA 100 MG tablet Take 100 mg by mouth daily as needed for erectile dysfunction. Reported on 10/22/2015     No current facility-administered medications for this visit.     REVIEW OF SYSTEMS:    10 Point review of Systems was done is negative except as noted above.  PHYSICAL EXAMINATION: ECOG PERFORMANCE STATUS: 1 - Symptomatic but completely ambulatory  . Vitals:   05/09/16 1028   BP: 117/61  Pulse: 90  Resp: 18  Temp: 98.6 F (37 C)   Filed Weights   05/09/16 1028  Weight: 126 lb 4.8 oz (57.3 kg)   .Body mass index is 18.65 kg/m.  . Wt Readings from Last 3 Encounters:  05/18/16 132 lb 8 oz (60.1 kg)  05/13/16 125 lb 14.1 oz (57.1 kg)  05/09/16 126 lb 4.8 oz (57.3 kg)   GENERAL:middle aged AAM ,alert, in no acute distress and comfortable SKIN: skin color, texture, turgor are normal, no rashes or significant lesions EYES: normal, conjunctiva are pink and non-injected, sclera clear OROPHARYNX:no exudate, no erythema and lips, buccal mucosa, and tongue normal  NECK: supple, no  JVD, thyroid normal size, non-tender, without nodularity LYMPH:  no palpable lymphadenopathy in the cervical, axillary or inguinal LUNGS: clear to auscultation with normal respiratory effort HEART: regular rate & rhythm,  no murmurs and no lower extremity edema ABDOMEN: abdomen soft, non-tender, normoactive bowel sounds  Musculoskeletal: no cyanosis of digits and no clubbing  PSYCH: alert & oriented x 3 with fluent speech NEURO: no focal motor/sensory deficits  LABORATORY DATA:  I have reviewed the data as listed  Component     Latest Ref Rng & Units 05/09/2016  WBC     4.0 - 10.3 10e3/uL 8.6  NEUT#     1.5 - 6.5 10e3/uL 5.3  Hemoglobin     13.0 - 17.1 g/dL 10.8 (L)  HCT     38.4 - 49.9 % 32.0 (L)  Platelets     140 - 400 10e3/uL 359  MCV     79.3 - 98.0 fL 103.6 (H)  MCH     27.2 - 33.4 pg 35.0 (H)  MCHC     32.0 - 36.0 g/dL 33.8  RBC     4.20 - 5.82 10e6/uL 3.09 (L)  RDW     11.0 - 14.6 % 14.7 (H)  lymph#     0.9 - 3.3 10e3/uL 2.1  MONO#     0.1 - 0.9 10e3/uL 1.1 (H)  Eosinophils Absolute     0.0 - 0.5 10e3/uL 0.1  Basophils Absolute     0.0 - 0.1 10e3/uL 0.0  NEUT%     39.0 - 75.0 % 61.8  LYMPH%     14.0 - 49.0 % 24.5  MONO%     0.0 - 14.0 % 12.7  EOS%     0.0 - 7.0 % 0.9  BASO%     0.0 - 2.0 % 0.1  Retic %     0.80 - 1.80 % 1.27  Retic Ct  Abs     34.80 - 93.90 10e3/uL 39.24  Immature Retic Fract     3.00 - 10.60 % 2.00 (L)  Sodium     136 - 145 mEq/L 137  Potassium     3.5 - 5.1 mEq/L 3.6  Chloride     98 - 109 mEq/L 99  CO2     22 - 29 mEq/L 27  Glucose     70 - 140 mg/dl 110  BUN     7.0 - 26.0 mg/dL 8.7  Creatinine     0.7 - 1.3 mg/dL 0.7  Total Bilirubin     0.20 - 1.20 mg/dL 0.45  Alkaline Phosphatase     40 - 150 U/L 71  AST     5 - 34 U/L 20  ALT     0 - 55 U/L 15  Total Protein     6.4 - 8.3 g/dL 7.5  Albumin     3.5 - 5.0 g/dL 3.6  Calcium     8.4 - 10.4 mg/dL 9.3  Anion gap     3 - 11 mEq/L 10  EGFR     >90 ml/min/1.73 m2 >90    ASSESSMENT & PLAN:   55 yo AAM with ECOG PS of 1 with   1) Metastatic moderately to poorly differentiated 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 S/p palliative radiation therapy to the esophagus and regional lymph nodes. He has tolerated treatment generally well except for some mild grade 2 radiation esophagitis which  has now resolved. Repeat CT chest abdomen pelvis done 11/17/2015 show progression of his liver metastases and gastrohepatic lymph node as expected.  He has completed his 6 cycles of carboplatin and Taxol  With grade 1-2 fatigue and grade 1 neuropathy CT c/a/p with stable improved disease with some  T1 metastasis, as before. Additional lesions in the L3 vertebral body and right sacrum, corresponding to abnormalities on 04/06/2016 and worrisome for metastatic disease. 2) HTN- patient notes his appetite has improved and he is eating better. On amlodipine. 3) Dysphagia due to esophageal SCC -mostly resolved 4) grade 2 radiation esophagitis. Resolved . 5) leukocytosis likely due to Neulasta. No evidence of infection at this time. 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 This has improved some and remains  nonpainful. 8) neoplasm related pain especially from bone metastases in his lower back Plan -Patient has likely being off chemotherapy for the last month or so. On follow-up today his main symptom is only some increased low back pain likely related to his bony metastases in the lower back and sacrum. -We shall refer him back to  Dr Tammi Klippel regarding possible palliative RT for his skeletal lesions. -Continue  on Xgeva for bony metastases. -We will see him back in about a month with repeat CT chest abdomen pelvis to reevaluate his systemic progression and determine timing and need for palliative systemic chemotherapy. If needed we'll consider 5-FU/Oxaloplatin -based regimen neuropathy result 5-FU/irinotecan based regimen if neuropathy symptoms still bothersome.  -Continue when necessary oxycodone for pain.  All of his questions and concerns were answered in detail.   return to care with Dr. Irene Limbo in 4  weeks with repeat labs and CT chest abdomen pelvis  I spent 25 minutes counseling the patient face to face. The total time spent in the appointment was 30 minutes and more than 50% was on counseling and direct patient cares.    Sullivan Lone MD [place] AAHIVMS [facility] Hematology/Oncology Physician [facility]  (Office):       [phone] (Work cell):  [phone] (Fax):           [phone]

## 2016-05-30 ENCOUNTER — Ambulatory Visit
Admission: RE | Admit: 2016-05-30 | Discharge: 2016-05-30 | Disposition: A | Discharge status: Home / Self Care | Payer: 59 | Source: Ambulatory Visit | Attending: Radiation Oncology | Admitting: Radiation Oncology

## 2016-05-30 DIAGNOSIS — C155 Malignant neoplasm of lower third of esophagus: Secondary | ICD-10-CM | POA: Diagnosis not present

## 2016-05-31 ENCOUNTER — Ambulatory Visit
Admission: RE | Admit: 2016-05-31 | Discharge: 2016-05-31 | Disposition: A | Discharge status: Home / Self Care | Payer: 59 | Source: Ambulatory Visit | Attending: Radiation Oncology | Admitting: Radiation Oncology

## 2016-05-31 DIAGNOSIS — C155 Malignant neoplasm of lower third of esophagus: Secondary | ICD-10-CM | POA: Diagnosis not present

## 2016-06-01 ENCOUNTER — Ambulatory Visit
Admission: RE | Admit: 2016-06-01 | Discharge: 2016-06-01 | Disposition: A | Discharge status: Home / Self Care | Payer: 59 | Source: Ambulatory Visit | Attending: Radiation Oncology | Admitting: Radiation Oncology

## 2016-06-01 DIAGNOSIS — C155 Malignant neoplasm of lower third of esophagus: Secondary | ICD-10-CM | POA: Diagnosis not present

## 2016-06-02 ENCOUNTER — Encounter: Payer: Self-pay | Admitting: Radiation Oncology

## 2016-06-02 ENCOUNTER — Ambulatory Visit
Admission: RE | Admit: 2016-06-02 | Discharge: 2016-06-02 | Disposition: A | Discharge status: Home / Self Care | Payer: 59 | Source: Ambulatory Visit | Attending: Radiation Oncology | Admitting: Radiation Oncology

## 2016-06-02 VITALS — BP 119/81 | HR 100 | Temp 99.1°F | Resp 18 | Ht 69.0 in | Wt 125.2 lb

## 2016-06-02 DIAGNOSIS — C7951 Secondary malignant neoplasm of bone: Secondary | ICD-10-CM

## 2016-06-02 DIAGNOSIS — C155 Malignant neoplasm of lower third of esophagus: Secondary | ICD-10-CM | POA: Diagnosis not present

## 2016-06-02 NOTE — Progress Notes (Signed)
  Radiation Oncology         [id number]   Name: Samuel Henderson MRN: [id number]   Date: 06/02/2016  DOB: 1961/02/14   Weekly Radiation Therapy Management    ICD-9-CM ICD-10-CM   1. Bone metastases (HCC) 198.5 C79.51     Current Dose: 15 Gy  Planned Dose:  30 Gy  Narrative The patient presents for routine under treatment assessment.  Pain to back 5/10 taking Oxycodone.  Appetite is good having some pain when he is swallowing food.  Bowel/Bladder retention or incontinence: taking colace to avoid constipation associated with pain medication but, denies any bowel or bladder complaints having a bowel movement daily.  Having nausea daily taking ordered meds Zofran and compazine.  Reports having numbness and weakness in the bottom of both feet. Patient has a history of chemotherapy 6 cycles. Taking Prednisone 5 mg daily.  Set-up films were reviewed. The chart was checked.  Physical Findings  height is 5\' 9"  (1.753 m) and weight is 125 lb 3.2 oz (56.8 kg). His oral temperature is 99.1 F (37.3 C). His blood pressure is 119/81 and his pulse is 100. His respiration is 18 and oxygen saturation is 100%. . Weight essentially stable.  No significant changes.  Impression The patient is tolerating radiation.  Plan Continue treatment as planned.      Sheral Apley Tammi Klippel, M.D.  This document serves as a record of services personally performed by [name], MD. It was created on his behalf by Darcus Austin, a trained medical scribe. The creation of this record is based on the scribe's personal observations and the provider's statements to them. This document has been checked and approved by the attending provider.

## 2016-06-02 NOTE — Progress Notes (Addendum)
Vitals signs stable with low grade temp.  Pain to back 5/10 taking Oxycodone.  Appetite is good having some pain when he is swallowing food.  Bowel/Bladder retention or incontinence: taking colace to avoid constipation associated with pain medication but, denies any bowel or bladder complaints having a bowel movement daily.  Having nausea daily taking ordered meds Zofran and compazine. Reports having numbness and weakness in the bottom of both feet. Patient has a history of chemotherapy 6 cycles.  Taking Prednisone 5 mg daily. Wt Readings from Last 3 Encounters:  06/02/16 125 lb 3.2 oz (56.8 kg)  05/18/16 132 lb 8 oz (60.1 kg)  05/13/16 125 lb 14.1 oz (57.1 kg)  BP 119/81 (BP Location: Right Arm, Patient Position: Sitting, Cuff Size: Normal)   Pulse 100   Temp 99.1 F (37.3 C) (Oral)   Resp 18   Ht 5\' 9"  (1.753 m)   Wt 125 lb 3.2 oz (56.8 kg)   SpO2 100%   BMI 18.49 kg/m

## 2016-06-05 ENCOUNTER — Ambulatory Visit
Admission: RE | Admit: 2016-06-05 | Discharge: 2016-06-05 | Disposition: A | Discharge status: Home / Self Care | Payer: 59 | Source: Ambulatory Visit | Attending: Radiation Oncology | Admitting: Radiation Oncology

## 2016-06-05 ENCOUNTER — Encounter (HOSPITAL_COMMUNITY): Payer: Self-pay

## 2016-06-05 ENCOUNTER — Ambulatory Visit (HOSPITAL_COMMUNITY)
Admission: RE | Admit: 2016-06-05 | Discharge: 2016-06-05 | Disposition: A | Discharge status: Home / Self Care | Payer: 59 | Source: Ambulatory Visit | Attending: Hematology | Admitting: Hematology

## 2016-06-05 DIAGNOSIS — C787 Secondary malignant neoplasm of liver and intrahepatic bile duct: Secondary | ICD-10-CM | POA: Insufficient documentation

## 2016-06-05 DIAGNOSIS — G893 Neoplasm related pain (acute) (chronic): Secondary | ICD-10-CM | POA: Diagnosis not present

## 2016-06-05 DIAGNOSIS — C155 Malignant neoplasm of lower third of esophagus: Secondary | ICD-10-CM | POA: Insufficient documentation

## 2016-06-05 DIAGNOSIS — C7951 Secondary malignant neoplasm of bone: Secondary | ICD-10-CM | POA: Insufficient documentation

## 2016-06-05 DIAGNOSIS — M899 Disorder of bone, unspecified: Secondary | ICD-10-CM | POA: Insufficient documentation

## 2016-06-05 MED ORDER — IOPAMIDOL (ISOVUE-300) INJECTION 61%
100.0000 mL | Freq: Once | INTRAVENOUS | Status: AC | PRN
  Administered 2016-06-05: 100 mL via INTRAVENOUS

## 2016-06-06 ENCOUNTER — Ambulatory Visit
Admission: RE | Admit: 2016-06-06 | Discharge: 2016-06-06 | Disposition: A | Discharge status: Home / Self Care | Payer: 59 | Source: Ambulatory Visit | Attending: Radiation Oncology | Admitting: Radiation Oncology

## 2016-06-06 ENCOUNTER — Encounter: Payer: Self-pay | Admitting: Hematology

## 2016-06-06 ENCOUNTER — Ambulatory Visit (HOSPITAL_BASED_OUTPATIENT_CLINIC_OR_DEPARTMENT_OTHER): Payer: 59 | Admitting: Hematology

## 2016-06-06 ENCOUNTER — Telehealth: Payer: Self-pay | Admitting: Hematology

## 2016-06-06 ENCOUNTER — Other Ambulatory Visit (HOSPITAL_BASED_OUTPATIENT_CLINIC_OR_DEPARTMENT_OTHER): Payer: 59

## 2016-06-06 ENCOUNTER — Ambulatory Visit (HOSPITAL_BASED_OUTPATIENT_CLINIC_OR_DEPARTMENT_OTHER): Payer: 59

## 2016-06-06 VITALS — BP 125/62 | HR 105 | Temp 98.3°F | Resp 20 | Ht 69.0 in | Wt 125.4 lb

## 2016-06-06 DIAGNOSIS — C787 Secondary malignant neoplasm of liver and intrahepatic bile duct: Secondary | ICD-10-CM | POA: Diagnosis not present

## 2016-06-06 DIAGNOSIS — C155 Malignant neoplasm of lower third of esophagus: Secondary | ICD-10-CM

## 2016-06-06 DIAGNOSIS — R1319 Other dysphagia: Secondary | ICD-10-CM

## 2016-06-06 DIAGNOSIS — C7951 Secondary malignant neoplasm of bone: Secondary | ICD-10-CM

## 2016-06-06 DIAGNOSIS — C154 Malignant neoplasm of middle third of esophagus: Secondary | ICD-10-CM | POA: Diagnosis not present

## 2016-06-06 DIAGNOSIS — R1310 Dysphagia, unspecified: Secondary | ICD-10-CM | POA: Diagnosis not present

## 2016-06-06 DIAGNOSIS — G893 Neoplasm related pain (acute) (chronic): Secondary | ICD-10-CM

## 2016-06-06 LAB — COMPREHENSIVE METABOLIC PANEL
ALBUMIN: 3.4 g/dL — AB (ref 3.5–5.0)
ALK PHOS: 78 U/L (ref 40–150)
ALT: 11 U/L (ref 0–55)
AST: 20 U/L (ref 5–34)
Anion Gap: 13 mEq/L — ABNORMAL HIGH (ref 3–11)
BUN: 10.5 mg/dL (ref 7.0–26.0)
CHLORIDE: 94 meq/L — AB (ref 98–109)
CO2: 26 meq/L (ref 22–29)
Calcium: 9.8 mg/dL (ref 8.4–10.4)
Creatinine: 1 mg/dL (ref 0.7–1.3)
GLUCOSE: 123 mg/dL (ref 70–140)
POTASSIUM: 4.1 meq/L (ref 3.5–5.1)
SODIUM: 134 meq/L — AB (ref 136–145)
Total Bilirubin: 0.35 mg/dL (ref 0.20–1.20)
Total Protein: 8 g/dL (ref 6.4–8.3)

## 2016-06-06 LAB — CBC & DIFF AND RETIC
BASO%: 0.1 % (ref 0.0–2.0)
BASOS ABS: 0 10*3/uL (ref 0.0–0.1)
EOS ABS: 0.1 10*3/uL (ref 0.0–0.5)
EOS%: 1 % (ref 0.0–7.0)
HCT: 25.9 % — ABNORMAL LOW (ref 38.4–49.9)
HGB: 8.6 g/dL — ABNORMAL LOW (ref 13.0–17.1)
Immature Retic Fract: 11 % — ABNORMAL HIGH (ref 3.00–10.60)
LYMPH#: 0.7 10*3/uL — AB (ref 0.9–3.3)
LYMPH%: 10 % — ABNORMAL LOW (ref 14.0–49.0)
MCH: 31.9 pg (ref 27.2–33.4)
MCHC: 33.2 g/dL (ref 32.0–36.0)
MCV: 95.9 fL (ref 79.3–98.0)
MONO#: 0.7 10*3/uL (ref 0.1–0.9)
MONO%: 10.8 % (ref 0.0–14.0)
NEUT%: 78.1 % — ABNORMAL HIGH (ref 39.0–75.0)
NEUTROS ABS: 5.4 10*3/uL (ref 1.5–6.5)
Platelets: 452 10*3/uL — ABNORMAL HIGH (ref 140–400)
RBC: 2.7 10*6/uL — AB (ref 4.20–5.82)
RDW: 15.6 % — AB (ref 11.0–14.6)
RETIC %: 0.97 % (ref 0.80–1.80)
Retic Ct Abs: 26.19 10*3/uL — ABNORMAL LOW (ref 34.80–93.90)
WBC: 6.9 10*3/uL (ref 4.0–10.3)

## 2016-06-06 MED ORDER — DENOSUMAB 120 MG/1.7ML ~~LOC~~ SOLN
120.0000 mg | Freq: Once | SUBCUTANEOUS | Status: AC
Start: 2016-06-06 — End: 2016-06-06
  Administered 2016-06-06: 120 mg via SUBCUTANEOUS
  Filled 2016-06-06: qty 1.7

## 2016-06-06 NOTE — Telephone Encounter (Signed)
Spoke with patient re next appointment for 10/17. Patient to get new schedule at next visit. Velora Heckler will call patient re GI referral - referral in EPIC.

## 2016-06-06 NOTE — Patient Instructions (Signed)
Denosumab injection  What is this medicine?  DENOSUMAB (den oh sue mab) slows bone breakdown. Prolia is used to treat osteoporosis in women after menopause and in men. Xgeva is used to prevent bone fractures and other bone problems caused by cancer bone metastases. Xgeva is also used to treat giant cell tumor of the bone.  This medicine may be used for other purposes; ask your health care provider or pharmacist if you have questions.  What should I tell my health care provider before I take this medicine?  They need to know if you have any of these conditions:  -dental disease  -eczema  -infection or history of infections  -kidney disease or on dialysis  -low blood calcium or vitamin D  -malabsorption syndrome  -scheduled to have surgery or tooth extraction  -taking medicine that contains denosumab  -thyroid or parathyroid disease  -an unusual reaction to denosumab, other medicines, foods, dyes, or preservatives  -pregnant or trying to get pregnant  -breast-feeding  How should I use this medicine?  This medicine is for injection under the skin. It is given by a health care professional in a hospital or clinic setting.  If you are getting Prolia, a special MedGuide will be given to you by the pharmacist with each prescription and refill. Be sure to read this information carefully each time.  For Prolia, talk to your pediatrician regarding the use of this medicine in children. Special care may be needed. For Xgeva, talk to your pediatrician regarding the use of this medicine in children. While this drug may be prescribed for children as young as 13 years for selected conditions, precautions do apply.  Overdosage: If you think you have taken too much of this medicine contact a poison control center or emergency room at once.  NOTE: This medicine is only for you. Do not share this medicine with others.  What if I miss a dose?  It is important not to miss your dose. Call your doctor or health care professional if you are  unable to keep an appointment.  What may interact with this medicine?  Do not take this medicine with any of the following medications:  -other medicines containing denosumab  This medicine may also interact with the following medications:  -medicines that suppress the immune system  -medicines that treat cancer  -steroid medicines like prednisone or cortisone  This list may not describe all possible interactions. Give your health care provider a list of all the medicines, herbs, non-prescription drugs, or dietary supplements you use. Also tell them if you smoke, drink alcohol, or use illegal drugs. Some items may interact with your medicine.  What should I watch for while using this medicine?  Visit your doctor or health care professional for regular checks on your progress. Your doctor or health care professional may order blood tests and other tests to see how you are doing.  Call your doctor or health care professional if you get a cold or other infection while receiving this medicine. Do not treat yourself. This medicine may decrease your body's ability to fight infection.  You should make sure you get enough calcium and vitamin D while you are taking this medicine, unless your doctor tells you not to. Discuss the foods you eat and the vitamins you take with your health care professional.  See your dentist regularly. Brush and floss your teeth as directed. Before you have any dental work done, tell your dentist you are receiving this medicine.  Do   not become pregnant while taking this medicine or for 5 months after stopping it. Women should inform their doctor if they wish to become pregnant or think they might be pregnant. There is a potential for serious side effects to an unborn child. Talk to your health care professional or pharmacist for more information.  What side effects may I notice from receiving this medicine?  Side effects that you should report to your doctor or health care professional as soon as  possible:  -allergic reactions like skin rash, itching or hives, swelling of the face, lips, or tongue  -breathing problems  -chest pain  -fast, irregular heartbeat  -feeling faint or lightheaded, falls  -fever, chills, or any other sign of infection  -muscle spasms, tightening, or twitches  -numbness or tingling  -skin blisters or bumps, or is dry, peels, or red  -slow healing or unexplained pain in the mouth or jaw  -unusual bleeding or bruising  Side effects that usually do not require medical attention (Report these to your doctor or health care professional if they continue or are bothersome.):  -muscle pain  -stomach upset, gas  This list may not describe all possible side effects. Call your doctor for medical advice about side effects. You may report side effects to FDA at 1-800-FDA-1088.  Where should I keep my medicine?  This medicine is only given in a clinic, doctor's office, or other health care setting and will not be stored at home.  NOTE: This sheet is a summary. It may not cover all possible information. If you have questions about this medicine, talk to your doctor, pharmacist, or health care provider.      2016, [employer]. (2012-02-19 12:37:47)

## 2016-06-07 ENCOUNTER — Ambulatory Visit
Admission: RE | Admit: 2016-06-07 | Discharge: 2016-06-07 | Disposition: A | Discharge status: Home / Self Care | Payer: 59 | Source: Ambulatory Visit | Attending: Radiation Oncology | Admitting: Radiation Oncology

## 2016-06-07 ENCOUNTER — Encounter: Payer: Self-pay | Admitting: Internal Medicine

## 2016-06-07 DIAGNOSIS — C155 Malignant neoplasm of lower third of esophagus: Secondary | ICD-10-CM | POA: Diagnosis not present

## 2016-06-08 ENCOUNTER — Ambulatory Visit: Admission: RE | Admit: 2016-06-08 | Payer: 59 | Source: Ambulatory Visit | Admitting: Radiation Oncology

## 2016-06-08 ENCOUNTER — Ambulatory Visit
Admission: RE | Admit: 2016-06-08 | Discharge: 2016-06-08 | Disposition: A | Discharge status: Home / Self Care | Payer: 59 | Source: Ambulatory Visit | Attending: Radiation Oncology | Admitting: Radiation Oncology

## 2016-06-08 DIAGNOSIS — C155 Malignant neoplasm of lower third of esophagus: Secondary | ICD-10-CM | POA: Diagnosis not present

## 2016-06-09 ENCOUNTER — Encounter: Payer: Self-pay | Admitting: Radiation Oncology

## 2016-06-09 ENCOUNTER — Ambulatory Visit
Admission: RE | Admit: 2016-06-09 | Discharge: 2016-06-09 | Disposition: A | Discharge status: Home / Self Care | Payer: Self-pay | Source: Ambulatory Visit | Attending: Radiation Oncology | Admitting: Radiation Oncology

## 2016-06-09 ENCOUNTER — Inpatient Hospital Stay
Admission: RE | Admit: 2016-06-09 | Discharge: 2016-06-09 | Disposition: A | Discharge status: Home / Self Care | Payer: Self-pay | Source: Ambulatory Visit | Attending: Radiation Oncology | Admitting: Radiation Oncology

## 2016-06-09 ENCOUNTER — Ambulatory Visit
Admission: RE | Admit: 2016-06-09 | Discharge: 2016-06-09 | Disposition: A | Discharge status: Home / Self Care | Payer: 59 | Source: Ambulatory Visit | Attending: Radiation Oncology | Admitting: Radiation Oncology

## 2016-06-09 VITALS — BP 125/79 | HR 106 | Temp 98.1°F | Resp 18 | Ht 69.0 in | Wt 123.4 lb

## 2016-06-09 DIAGNOSIS — C155 Malignant neoplasm of lower third of esophagus: Secondary | ICD-10-CM | POA: Diagnosis not present

## 2016-06-09 DIAGNOSIS — C7951 Secondary malignant neoplasm of bone: Secondary | ICD-10-CM

## 2016-06-09 NOTE — Progress Notes (Signed)
   Weekly Management Note  Outpatient    ICD-9-CM ICD-10-CM   1. Bone metastases (HCC) 198.5 C79.51     Completed Radiotherapy. Total Dose: 30 Gy   Narrative:  The patient presents for routine under treatment assessment on last day of radiotherapy.  CBCT/MVCT images/Port film x-rays were reviewed.  The chart was checked.  Vitals signs stable.  Pain to back and right arm an 8/10 and he is taking Oxycodone.  Appetite is good, but is having some pain when he is swallowing food. Bowel/Bladder retention or incontinence: taking colace to avoid constipation associated with pain medication but, denies any bowel or bladder complaints having a bowel movement daily.  Having nausea daily taking ordered meds Zofran and compazine. Reports having numbness and weakness in the bottom of both feet. Patient has a history of chemotherapy 6 cycles.  Taking Prednisone 5 mg daily.  EOT card given to see Shona Simpson, P.A. In a month for follow up visit.  See education for documentation.   Physical Findings:   Vitals - 1 value per visit [id number]  SYSTOLIC 0000000  DIASTOLIC 79  Pulse [id number]  Temperature 98.1  Respirations 18  Weight (lb) 123.4  Height 5\' 9"   BMI 18.22  VISIT REPORT    No skin irritation over the lower back. Ambulatory with no acute distress.  Impression:  The patient has tolerated radiotherapy.  Plan:  Routine follow-up in one month. Offered X-ray for 3 day history of right humeral pain. He would rather keep an eye on it and let us know if he wants one later. ________________________________   Eppie Gibson, M.D.  This document serves as a record of services personally performed by Eppie Gibson, MD. It was created on her behalf by Darcus Austin, a trained medical scribe. The creation of this record is based on the scribe's personal observations and the provider's statements to them. This document has been checked and approved by the attending provider.

## 2016-06-09 NOTE — Addendum Note (Signed)
Encounter addended by: Malena Edman, RN on: 06/09/2016  5:51 PM<BR>    Actions taken: Patient Education assessment filed

## 2016-06-09 NOTE — Progress Notes (Addendum)
Vitals signs stable.  Pain to back and right arm 8/10 taking Oxycodone.  Appetite is good having some pain when he is swallowing food.  Bowel/Bladder retention or incontinence: taking colace to avoid constipation associated with pain medication but, denies any bowel or bladder complaints having a bowel movement daily.  Having nausea daily taking ordered meds Zofran and compazine. Reports having numbness and weakness in the bottom of both feet. Patient has a history of chemotherapy 6 cycles.  Taking Prednisone 5 mg daily.  EOT card given to see Shona Simpson, P.A. In a month for follow up visit.  See education for documentation. Wt Readings from Last 3 Encounters:  06/09/16 123 lb 6.4 oz (56 kg)  06/06/16 125 lb 6.4 oz (56.9 kg)  06/02/16 125 lb 3.2 oz (56.8 kg)  BP 125/79 (BP Location: Left Arm, Patient Position: Sitting, Cuff Size: Normal)   Pulse (!) 106   Temp 98.1 F (36.7 C) (Oral)   Resp 18   Ht 5\' 9"  (1.753 m)   Wt 123 lb 6.4 oz (56 kg)   BMI 18.22 kg/m

## 2016-06-13 ENCOUNTER — Other Ambulatory Visit: Payer: Self-pay | Admitting: *Deleted

## 2016-06-13 ENCOUNTER — Encounter: Payer: Self-pay | Admitting: Internal Medicine

## 2016-06-13 ENCOUNTER — Telehealth: Payer: Self-pay | Admitting: *Deleted

## 2016-06-13 ENCOUNTER — Ambulatory Visit (AMBULATORY_SURGERY_CENTER): Payer: Self-pay | Admitting: *Deleted

## 2016-06-13 VITALS — Ht 69.0 in | Wt 121.0 lb

## 2016-06-13 DIAGNOSIS — R1310 Dysphagia, unspecified: Secondary | ICD-10-CM

## 2016-06-13 DIAGNOSIS — R1319 Other dysphagia: Secondary | ICD-10-CM

## 2016-06-13 DIAGNOSIS — C159 Malignant neoplasm of esophagus, unspecified: Secondary | ICD-10-CM

## 2016-06-13 MED ORDER — OXYCODONE HCL 5 MG/5ML PO SOLN: 5.0000 mg | ORAL | 0 refills | Status: DC | PRN

## 2016-06-13 NOTE — Progress Notes (Signed)
Patient denies any allergies to eggs or soy. Patient denies any problems with anesthesia/sedation. Patient denies any oxygen use at home and does not take any diet/weight loss medications.  

## 2016-06-13 NOTE — Telephone Encounter (Signed)
error 

## 2016-06-14 MED FILL — oxyCODONE HCL 5 MG/5ML SOLN: 5 | 7 days supply | Qty: 473 | Fill #0

## 2016-06-16 ENCOUNTER — Telehealth: Payer: Self-pay | Admitting: Radiation Oncology

## 2016-06-16 DIAGNOSIS — C7951 Secondary malignant neoplasm of bone: Secondary | ICD-10-CM

## 2016-06-16 DIAGNOSIS — C159 Malignant neoplasm of esophagus, unspecified: Secondary | ICD-10-CM

## 2016-06-16 NOTE — Telephone Encounter (Signed)
I spoke with the patient regarding his symptoms of right upper extremity pain. He had a shoulder x-ray about one month ago and a bone scan in August. Neither identified his symptoms, however he did develop symptoms since the bone scan and he describes pain lower in the arm, closwer toward the olecranon than the shoulder. This should not be due to his previous radiation, and brachial injury would be unusual in this setting given it is rare, and the short interval of time since his treatment doesn't fit a typical presentation for this concern. He is agreeable to repeat xray and we will discuss further with his PCP if this is negative.

## 2016-06-18 NOTE — Progress Notes (Signed)
  Radiation Oncology         ([phone]) [phone] ________________________________  Name: ABDISHAKUR RYMAN MRN: [id number]  Date: 06/09/2016  DOB: [birth year]  End of Treatment Note  Diagnosis:   55 yo man with lumbar metastases from metastatic prostate cancer     Indication for treatment:  Palliation       Radiation treatment dates:   05/29/16-06/09/16  Site/dose:   L2-SI Joints was treated to 30 Gy in 10 fractions  Beams/energy:   AP/PA 3D plan with 15MV X-rays  Narrative: The patient tolerated radiation treatment relatively well.   Pain in back was 5/10 taking Oxycodone.  Appetite was good. Had nausea daily taking ordered meds Zofran and compazine.  Reported having numbness and weakness in the bottom of both feet. Patient had a history of chemotherapy 6 cycles. Took Prednisone 5 mg daily.  Plan: The patient has completed radiation treatment. The patient will return to radiation oncology clinic for routine followup in one month. I advised him to call or return sooner if he has any questions or concerns related to his recovery or treatment. ________________________________  Sheral Apley. Tammi Klippel, M.D.

## 2016-06-19 ENCOUNTER — Ambulatory Visit (HOSPITAL_COMMUNITY)
Admission: RE | Admit: 2016-06-19 | Discharge: 2016-06-19 | Disposition: A | Discharge status: Home / Self Care | Payer: 59 | Source: Ambulatory Visit | Attending: Radiation Oncology | Admitting: Radiation Oncology

## 2016-06-19 DIAGNOSIS — C159 Malignant neoplasm of esophagus, unspecified: Secondary | ICD-10-CM | POA: Insufficient documentation

## 2016-06-19 DIAGNOSIS — C7951 Secondary malignant neoplasm of bone: Secondary | ICD-10-CM | POA: Diagnosis present

## 2016-06-20 ENCOUNTER — Other Ambulatory Visit: Payer: Self-pay | Admitting: *Deleted

## 2016-06-20 ENCOUNTER — Other Ambulatory Visit (HOSPITAL_BASED_OUTPATIENT_CLINIC_OR_DEPARTMENT_OTHER): Payer: 59

## 2016-06-20 ENCOUNTER — Telehealth: Payer: Self-pay | Admitting: Hematology

## 2016-06-20 ENCOUNTER — Ambulatory Visit (HOSPITAL_BASED_OUTPATIENT_CLINIC_OR_DEPARTMENT_OTHER): Payer: 59 | Admitting: Hematology

## 2016-06-20 ENCOUNTER — Ambulatory Visit (HOSPITAL_COMMUNITY)
Admission: RE | Admit: 2016-06-20 | Discharge: 2016-06-20 | Disposition: A | Discharge status: Home / Self Care | Payer: 59 | Source: Ambulatory Visit | Attending: Hematology | Admitting: Hematology

## 2016-06-20 ENCOUNTER — Ambulatory Visit (HOSPITAL_BASED_OUTPATIENT_CLINIC_OR_DEPARTMENT_OTHER): Payer: 59

## 2016-06-20 DIAGNOSIS — C7951 Secondary malignant neoplasm of bone: Secondary | ICD-10-CM | POA: Diagnosis not present

## 2016-06-20 DIAGNOSIS — C154 Malignant neoplasm of middle third of esophagus: Secondary | ICD-10-CM | POA: Diagnosis not present

## 2016-06-20 DIAGNOSIS — R1310 Dysphagia, unspecified: Secondary | ICD-10-CM

## 2016-06-20 DIAGNOSIS — R1319 Other dysphagia: Secondary | ICD-10-CM

## 2016-06-20 DIAGNOSIS — D649 Anemia, unspecified: Secondary | ICD-10-CM | POA: Diagnosis not present

## 2016-06-20 DIAGNOSIS — C155 Malignant neoplasm of lower third of esophagus: Secondary | ICD-10-CM | POA: Diagnosis not present

## 2016-06-20 LAB — COMPREHENSIVE METABOLIC PANEL
ALBUMIN: 3.2 g/dL — AB (ref 3.5–5.0)
ALT: 8 U/L (ref 0–55)
ANION GAP: 13 meq/L — AB (ref 3–11)
AST: 28 U/L (ref 5–34)
Alkaline Phosphatase: 73 U/L (ref 40–150)
BILIRUBIN TOTAL: 0.23 mg/dL (ref 0.20–1.20)
BUN: 14.9 mg/dL (ref 7.0–26.0)
CALCIUM: 9.2 mg/dL (ref 8.4–10.4)
CHLORIDE: 87 meq/L — AB (ref 98–109)
CO2: 26 mEq/L (ref 22–29)
CREATININE: 1.1 mg/dL (ref 0.7–1.3)
EGFR: >90 mL/min/{1.73_m2} (ref >90)
Glucose: 103 mg/dl (ref 70–140)
Potassium: 3.9 mEq/L (ref 3.5–5.1)
Sodium: 127 mEq/L — ABNORMAL LOW (ref 136–145)
TOTAL PROTEIN: 7.3 g/dL (ref 6.4–8.3)

## 2016-06-20 LAB — IRON AND TIBC
%SAT: 9 % — AB (ref 20–55)
IRON: 19 ug/dL — AB (ref 42–163)
TIBC: 218 ug/dL (ref 202–409)
UIBC: 198 ug/dL (ref 117–376)

## 2016-06-20 LAB — CBC & DIFF AND RETIC
BASO%: 0.2 % (ref 0.0–2.0)
BASOS ABS: 0 10*3/uL (ref 0.0–0.1)
EOS ABS: 0.2 10*3/uL (ref 0.0–0.5)
EOS%: 3.2 % (ref 0.0–7.0)
HEMATOCRIT: 25.6 % — AB (ref 38.4–49.9)
HEMOGLOBIN: 8.5 g/dL — AB (ref 13.0–17.1)
IMMATURE RETIC FRACT: 11.5 % — AB (ref 3.00–10.60)
LYMPH#: 0.5 10*3/uL — AB (ref 0.9–3.3)
LYMPH%: 7.9 % — ABNORMAL LOW (ref 14.0–49.0)
MCH: 30.8 pg (ref 27.2–33.4)
MCHC: 33.2 g/dL (ref 32.0–36.0)
MCV: 92.8 fL (ref 79.3–98.0)
MONO#: 0.7 10*3/uL (ref 0.1–0.9)
MONO%: 10.4 % (ref 0.0–14.0)
NEUT#: 5.2 10*3/uL (ref 1.5–6.5)
NEUT%: 78.3 % — AB (ref 39.0–75.0)
PLATELETS: 406 10*3/uL — AB (ref 140–400)
RBC: 2.76 10*6/uL — ABNORMAL LOW (ref 4.20–5.82)
RDW: 16.1 % — AB (ref 11.0–14.6)
RETIC %: 1.53 % (ref 0.80–1.80)
RETIC CT ABS: 42.23 10*3/uL (ref 34.80–93.90)
WBC: 6.6 10*3/uL (ref 4.0–10.3)

## 2016-06-20 LAB — ABO/RH: ABO/RH(D): O POS

## 2016-06-20 LAB — TECHNOLOGIST REVIEW

## 2016-06-20 LAB — FERRITIN: Ferritin: 470 ng/ml — ABNORMAL HIGH (ref 22–316)

## 2016-06-20 LAB — PREPARE RBC (CROSSMATCH)

## 2016-06-20 MED ORDER — OXYCODONE-ACETAMINOPHEN 5-325 MG PO TABS
ORAL_TABLET | ORAL | Status: AC
  Filled 2016-06-20: qty 2

## 2016-06-20 MED ORDER — SODIUM CHLORIDE 0.9 % IV SOLN
250.0000 mL | Freq: Once | INTRAVENOUS | Status: DC
Start: 2016-06-20 — End: 2016-06-20

## 2016-06-20 MED ORDER — GABAPENTIN 100 MG PO CAPS: ORAL_CAPSULE | ORAL | 0 refills | Status: DC

## 2016-06-20 MED ORDER — HEPARIN SOD (PORK) LOCK FLUSH 100 UNIT/ML IV SOLN
250.0000 [IU] | INTRAVENOUS | Status: DC | PRN
  Filled 2016-06-20: qty 5

## 2016-06-20 MED ORDER — SODIUM CHLORIDE 0.9% FLUSH
10.0000 mL | INTRAVENOUS | Status: AC | PRN
  Administered 2016-06-20: 10 mL
  Filled 2016-06-20: qty 10

## 2016-06-20 MED ORDER — HEPARIN SOD (PORK) LOCK FLUSH 100 UNIT/ML IV SOLN
500.0000 [IU] | Freq: Once | INTRAVENOUS | Status: AC
  Administered 2016-06-20: 500 [IU] via INTRAVENOUS
  Filled 2016-06-20: qty 5

## 2016-06-20 MED ORDER — OXYCODONE-ACETAMINOPHEN 5-325 MG PO TABS
2.0000 | ORAL_TABLET | Freq: Once | ORAL | Status: AC
  Administered 2016-06-20: 2 via ORAL

## 2016-06-20 NOTE — Telephone Encounter (Signed)
Gave patient mother avs report and appointments for January October and November. Lab/fu/tx scheduled for 10/26 instead of 10/27 due to patient will need to return for pump d/c 48 hrs later and center is closed on 10/29 (sunday).

## 2016-06-20 NOTE — Patient Instructions (Signed)
Discontinue aspirin Discontinue use of diclofenac Discontinue lisinopril/ hydrochlorothiazide

## 2016-06-20 NOTE — Patient Instructions (Signed)

## 2016-06-21 ENCOUNTER — Telehealth: Payer: Self-pay | Admitting: *Deleted

## 2016-06-21 LAB — TYPE AND SCREEN
ABO/RH(D): O POS
Antibody Screen: NEGATIVE
UNIT DIVISION: 0

## 2016-06-21 NOTE — Telephone Encounter (Signed)
Called and spoke to Samuel Henderson and his mother to relay that his recent Xray of his Right Humerus does not show any pathological fracture .  He was advised to call and obtain an appointment with Dr. Leanna Battles, and he stated he would.

## 2016-06-26 ENCOUNTER — Ambulatory Visit (AMBULATORY_SURGERY_CENTER): Payer: 59 | Admitting: Internal Medicine

## 2016-06-26 ENCOUNTER — Telehealth: Payer: Self-pay | Admitting: Internal Medicine

## 2016-06-26 ENCOUNTER — Encounter: Payer: Self-pay | Admitting: Internal Medicine

## 2016-06-26 VITALS — BP 147/71 | HR 87 | Temp 98.4°F | Resp 12 | Ht 69.0 in | Wt 121.0 lb

## 2016-06-26 DIAGNOSIS — R1310 Dysphagia, unspecified: Secondary | ICD-10-CM | POA: Diagnosis not present

## 2016-06-26 DIAGNOSIS — C159 Malignant neoplasm of esophagus, unspecified: Secondary | ICD-10-CM

## 2016-06-26 MED ORDER — SODIUM CHLORIDE 0.9 % IV SOLN: 500.0000 mL | INTRAVENOUS | Status: DC

## 2016-06-26 NOTE — Progress Notes (Signed)
Called to room to assist during endoscopic procedure.  Patient ID and intended procedure confirmed with present staff. Received instructions for my participation in the procedure from the performing physician.  

## 2016-06-26 NOTE — Patient Instructions (Signed)
Recommendations:  Return to normal activities tomorrow. Full liquid diet. Continue present medications.  YOU HAD AN ENDOSCOPIC PROCEDURE TODAY AT [facility]:   Refer to the procedure report that was given to you for any specific questions about what was found during the examination.  If the procedure report does not answer your questions, please call your gastroenterologist to clarify.  If you requested that your care partner not be given the details of your procedure findings, then the procedure report has been included in a sealed envelope for you to review at your convenience later.  YOU SHOULD EXPECT: Some feelings of bloating in the abdomen. Passage of more gas than usual.  Walking can help get rid of the air that was put into your GI tract during the procedure and reduce the bloating. If you had a lower endoscopy (such as a colonoscopy or flexible sigmoidoscopy) you may notice spotting of blood in your stool or on the toilet paper. If you underwent a bowel prep for your procedure, you may not have a normal bowel movement for a few days.  Please Note:  You might notice some irritation and congestion in your nose or some drainage.  This is from the oxygen used during your procedure.  There is no need for concern and it should clear up in a day or so.  SYMPTOMS TO REPORT IMMEDIATELY:    Following upper endoscopy (EGD)  Vomiting of blood or coffee ground material  New chest pain or pain under the shoulder blades  Painful or persistently difficult swallowing  New shortness of breath  Fever of 100F or higher  Black, tarry-looking stools  For urgent or emergent issues, a gastroenterologist can be reached at any hour by calling [phone].   DIET:  We do recommend a small meal at first, but then you may proceed to your regular diet.  Drink plenty of fluids but you should avoid alcoholic beverages for 24 hours.  ACTIVITY:  You should plan to take it easy for the rest  of today and you should NOT DRIVE or use heavy machinery until tomorrow (because of the sedation medicines used during the test).    FOLLOW UP: Our staff will call the number listed on your records the next business day following your procedure to check on you and address any questions or concerns that you may have regarding the information given to you following your procedure. If we do not reach you, we will leave a message.  However, if you are feeling well and you are not experiencing any problems, there is no need to return our call.  We will assume that you have returned to your regular daily activities without incident.  If any biopsies were taken you will be contacted by phone or by letter within the next 1-3 weeks.  Please call us at [phone] if you have not heard about the biopsies in 3 weeks.    SIGNATURES/CONFIDENTIALITY: You and/or your care partner have signed paperwork which will be entered into your electronic medical record.  These signatures attest to the fact that that the information above on your After Visit Summary has been reviewed and is understood.  Full responsibility of the confidentiality of this discharge information lies with you and/or your care-partner.

## 2016-06-26 NOTE — Progress Notes (Signed)
Spontaneous respirations throughout. VSS. Resting comfortably. To PACU on room air. Report to  [facility].

## 2016-06-26 NOTE — Telephone Encounter (Signed)
I called and spoke with Dr. Irene Limbo after repeat endoscopy today shows local tumor recurrence in mid esophagus causing considerable esophageal luminal narrowing Biopsies were performed Discussed options for nutrition with my partner Dr. Ardis Hughs, the patient and his mother along with Dr. Irene Limbo. My opinion is that feeding tube is a better option and less risky than esophageal stent at this point particularly if he plans to pursue further therapies. The patient and his mother were agreeable to interventional radiology PEG placement and I will placed this order. He will follow-up with Dr. Irene Limbo once biopsy results available to discuss further treatment options Should palliation become the ultimate goal with limited life expectancy and all other options have been exhausted, esophageal stent would potentially be an option

## 2016-06-26 NOTE — Op Note (Signed)
[facility] Patient Name: Samuel Henderson Procedure Date: 06/26/2016 9:56 AM MRN: [id number] Endoscopist: Jerene Bears , MD Age: 55 Referring MD:  Date of Birth: 1961-07-31 Gender: Male Account #: [id number] Procedure:                Upper GI endoscopy Indications:              Malignant esophageal squamous cell carcinoma,                            Follow-up of malignant esophageal squamous cell                            carcinoma Medicines:                Monitored Anesthesia Care Procedure:                Pre-Anesthesia Assessment:                           - Prior to the procedure, a History and Physical                            was performed, and patient medications and                            allergies were reviewed. The patient's tolerance of                            previous anesthesia was also reviewed. The risks                            and benefits of the procedure and the sedation                            options and risks were discussed with the patient.                            All questions were answered, and informed consent                            was obtained. Prior Anticoagulants: The patient has                            taken no previous anticoagulant or antiplatelet                            agents. ASA Grade Assessment: III - A patient with                            severe systemic disease. After reviewing the risks                            and benefits, the patient was deemed in  satisfactory condition to undergo the procedure.                           After obtaining informed consent, the endoscope was                            passed under direct vision. Throughout the                            procedure, the patient's blood pressure, pulse, and                            oxygen saturations were monitored continuously. The                            Model GIF-HQ190 [id number]) scope was  introduced                            through the mouth, and advanced to the middle third                            of esophagus. The upper GI endoscopy was                            accomplished without difficulty. The patient                            tolerated the procedure well. Scope In: Scope Out: Findings:                 A fungating mass with ulceration and with no                            bleeding and no stigmata of recent bleeding was                            found in the middle third of the esophagus, 30 cm                            from the incisors. The mass was partially                            obstructing and circumferential. This was unable to                            be traversed with the adult upper endoscope.                            Multiple biopsies were obtained with cold forceps                            for histology in a targeted manner (including for  PDL1 testing).                           An examination of the stomach was not performed.                           An examination of the duodenum was not performed. Complications:            No immediate complications. Estimated Blood Loss:     Estimated blood loss was minimal. Impression:               - Partially obstructing, recurrent, malignant                            esophageal tumor was found in the middle third of                            the esophagus. Unable to be traversed. Multiple                            biopsies were obtained.                           - Stomach and duodenum not examined/seen. Recommendation:           - Patient has a contact number available for                            emergencies. The signs and symptoms of potential                            delayed complications were discussed with the                            patient. Return to normal activities tomorrow.                            Written discharge instructions were provided  to the                            patient.                           - Full liquid diet.                           - Continue present medications.                           - Perform a cine-esophagram (no tablet) at the next                            available appointment.                           - Await pathology results.                           -  Would recommend IR versus surgical consultation                            for feeding tube placement (not amenable to                            endoscopic placement). Esophageal stenting would be                            for palliation only and if other options (at                            establishing feeding route, as well as chemotherapy                            and radiation therapy) have been unsuccessful. Jerene Bears, MD 06/26/2016 10:22:37 AM This report has been signed electronically.

## 2016-06-27 ENCOUNTER — Other Ambulatory Visit: Payer: Self-pay

## 2016-06-27 ENCOUNTER — Telehealth: Payer: Self-pay

## 2016-06-27 DIAGNOSIS — C159 Malignant neoplasm of esophagus, unspecified: Secondary | ICD-10-CM

## 2016-06-27 DIAGNOSIS — R1310 Dysphagia, unspecified: Secondary | ICD-10-CM

## 2016-06-27 NOTE — Telephone Encounter (Signed)
Pt having G tube placed by IR 07/05/16@[facility] . Pt having barium esophagram with contrast 07/07/16@10 :30am, pt to arrive at [facility] at 10:15am. Pt to be NPO after 7:30am. Pt aware of appts.

## 2016-06-27 NOTE — Telephone Encounter (Signed)
Left a message at [phone] for the pt to call us back if any questions or concerns. maw

## 2016-06-27 NOTE — Telephone Encounter (Signed)
  Follow up Call-  Call back number 06/26/2016 09/07/2015 08/12/2014  Post procedure Call Back phone  # [phone] or [phone] (leave message here)  Permission to leave phone message Yes Yes Yes  Some recent data might be hidden     Patient questions:  Do you have a fever, pain , or abdominal swelling? No. Pain Score  0 *  Have you tolerated food without any problems? Yes.    Have you been able to return to your normal activities? Yes.    Do you have any questions about your discharge instructions: Diet   No. Medications  No. Follow up visit  No.  Do you have questions or concerns about your Care? No.  Actions: * If pain score is 4 or above: No action needed, pain <4.

## 2016-06-28 ENCOUNTER — Telehealth: Payer: Self-pay | Admitting: Hematology

## 2016-06-28 NOTE — Telephone Encounter (Signed)
PAL - moved lab/fu from 10/26 to 10/31 per [place]. Lab/fu added - message to infusion supervisor re adding tx. Spoke with patient re changes and new date/time for 07/04/16. Patient to get new schedule 10/31 and aware chemo will be added.

## 2016-06-29 ENCOUNTER — Ambulatory Visit: Payer: 59

## 2016-06-29 ENCOUNTER — Other Ambulatory Visit: Payer: 59

## 2016-06-29 ENCOUNTER — Ambulatory Visit: Payer: 59 | Admitting: Hematology

## 2016-06-29 NOTE — Telephone Encounter (Signed)
Samuel Henderson Can the barium swallow be done before the IR PEG?  This should be helpful to IR

## 2016-06-30 ENCOUNTER — Encounter: Payer: Self-pay | Admitting: Nutrition

## 2016-06-30 NOTE — Telephone Encounter (Signed)
See result note.  

## 2016-06-30 NOTE — Progress Notes (Signed)
I was asked to calculate TF orders for patient who is having a PEG placed on Jul 05, 2617.   55 year old male with recurrent esophageal cancer and history of dysphagia and past chemoradiation tx.  PMH includes HTN and Arthritis  Medications include calcium and Vit D, colace, ativan, prilosec, zofran, miralax, compazine, and senna.  Labs include Na 127 and Alb 3.2 on 10/17.  Height: 69 inches. Weight: 121 pounds. UBW:  132 pounds Sep 2017. BMI: 17.87.  Estimated Nutrition Needs: 1900- 2100 cal, 78 - 90 grams protein, 2.1 L fluid.  Patient is familiar from previous treatment. I have not met with him since July 2017. Nurse Navigator requesting TF orders for upcoming feeding tube placement.  Recommend Osmolite 1.5, bolus regimen as follows:  Begin with 1/2 can QID with 120 mL free water before and after feedings.  If tolerated, increase to 3/4 can QID on day 3. If tolerated, increase to 1 can QID on day 5. If tolerated, increase to 1.5 cans QID on day 7. In addition, drink or flush tube with an additional 240 mL free water.  Monitor patient for refeeding syndrome. Recommend CMET, Phosphorus and Magnesium before TF begins for baseline and then 2 times weekly until patient meeting TF goal rate.  If patient prefers continuous feedings, begin Osmolite 1.5 at 20 mL daily and increase 10 mL daily until goal of 60 mL daily achieved. Give 200 mL free water 6 times daily,  6 cans Osmolite 1.5 plus free water provides 2130 cal, 89 grams protein, 2166 mL free water.  Patient should have RD follow up scheduled.  **Disclaimer: This note was dictated with voice recognition software. Similar sounding words can inadvertently be transcribed and this note may contain transcription errors which may not have been corrected upon publication of note.**

## 2016-07-03 ENCOUNTER — Ambulatory Visit (HOSPITAL_COMMUNITY)
Admission: RE | Admit: 2016-07-03 | Discharge: 2016-07-03 | Disposition: A | Discharge status: Home / Self Care | Payer: 59 | Source: Ambulatory Visit | Attending: Internal Medicine | Admitting: Internal Medicine

## 2016-07-03 DIAGNOSIS — K222 Esophageal obstruction: Secondary | ICD-10-CM | POA: Insufficient documentation

## 2016-07-03 DIAGNOSIS — R1310 Dysphagia, unspecified: Secondary | ICD-10-CM | POA: Diagnosis present

## 2016-07-03 DIAGNOSIS — C159 Malignant neoplasm of esophagus, unspecified: Secondary | ICD-10-CM | POA: Diagnosis not present

## 2016-07-04 ENCOUNTER — Other Ambulatory Visit (HOSPITAL_BASED_OUTPATIENT_CLINIC_OR_DEPARTMENT_OTHER): Payer: 59

## 2016-07-04 ENCOUNTER — Telehealth: Payer: Self-pay | Admitting: *Deleted

## 2016-07-04 ENCOUNTER — Ambulatory Visit (HOSPITAL_BASED_OUTPATIENT_CLINIC_OR_DEPARTMENT_OTHER): Payer: 59 | Admitting: Hematology

## 2016-07-04 ENCOUNTER — Other Ambulatory Visit: Payer: Self-pay | Admitting: Physician Assistant

## 2016-07-04 ENCOUNTER — Other Ambulatory Visit: Payer: Self-pay | Admitting: *Deleted

## 2016-07-04 ENCOUNTER — Ambulatory Visit: Payer: 59

## 2016-07-04 ENCOUNTER — Encounter: Payer: Self-pay | Admitting: Lab

## 2016-07-04 VITALS — BP 152/79 | HR 107 | Temp 98.6°F | Resp 18 | Ht 69.0 in | Wt 117.2 lb

## 2016-07-04 DIAGNOSIS — C7951 Secondary malignant neoplasm of bone: Secondary | ICD-10-CM | POA: Diagnosis not present

## 2016-07-04 DIAGNOSIS — E440 Moderate protein-calorie malnutrition: Secondary | ICD-10-CM | POA: Diagnosis not present

## 2016-07-04 DIAGNOSIS — R1310 Dysphagia, unspecified: Secondary | ICD-10-CM

## 2016-07-04 DIAGNOSIS — R1319 Other dysphagia: Secondary | ICD-10-CM

## 2016-07-04 DIAGNOSIS — C155 Malignant neoplasm of lower third of esophagus: Secondary | ICD-10-CM | POA: Diagnosis not present

## 2016-07-04 DIAGNOSIS — C787 Secondary malignant neoplasm of liver and intrahepatic bile duct: Secondary | ICD-10-CM | POA: Diagnosis not present

## 2016-07-04 DIAGNOSIS — C159 Malignant neoplasm of esophagus, unspecified: Secondary | ICD-10-CM

## 2016-07-04 DIAGNOSIS — C154 Malignant neoplasm of middle third of esophagus: Secondary | ICD-10-CM | POA: Diagnosis not present

## 2016-07-04 LAB — COMPREHENSIVE METABOLIC PANEL
ALT: 13 U/L (ref 0–55)
AST: 33 U/L (ref 5–34)
Albumin: 3 g/dL — ABNORMAL LOW (ref 3.5–5.0)
Alkaline Phosphatase: 124 U/L (ref 40–150)
Anion Gap: 13 mEq/L — ABNORMAL HIGH (ref 3–11)
BUN: 6.4 mg/dL — AB (ref 7.0–26.0)
CALCIUM: 9.5 mg/dL (ref 8.4–10.4)
CHLORIDE: 93 meq/L — AB (ref 98–109)
CO2: 27 meq/L (ref 22–29)
CREATININE: 0.7 mg/dL (ref 0.7–1.3)
EGFR: >90 mL/min/{1.73_m2} (ref >90)
GLUCOSE: 115 mg/dL (ref 70–140)
POTASSIUM: 4.4 meq/L (ref 3.5–5.1)
SODIUM: 133 meq/L — AB (ref 136–145)
Total Bilirubin: 0.36 mg/dL (ref 0.20–1.20)
Total Protein: 7.7 g/dL (ref 6.4–8.3)

## 2016-07-04 LAB — CBC & DIFF AND RETIC
BASO%: 0.3 % (ref 0.0–2.0)
BASOS ABS: 0 10*3/uL (ref 0.0–0.1)
EOS ABS: 0.1 10*3/uL (ref 0.0–0.5)
EOS%: 0.5 % (ref 0.0–7.0)
HEMATOCRIT: 28.4 % — AB (ref 38.4–49.9)
HEMOGLOBIN: 9.4 g/dL — AB (ref 13.0–17.1)
IMMATURE RETIC FRACT: 16.1 % — AB (ref 3.00–10.60)
LYMPH%: 12.5 % — AB (ref 14.0–49.0)
MCH: 30.1 pg (ref 27.2–33.4)
MCHC: 33.1 g/dL (ref 32.0–36.0)
MCV: 91 fL (ref 79.3–98.0)
MONO#: 1.5 10*3/uL — AB (ref 0.1–0.9)
MONO%: 15.8 % — AB (ref 0.0–14.0)
NEUT%: 70.9 % (ref 39.0–75.0)
NEUTROS ABS: 6.8 10*3/uL — AB (ref 1.5–6.5)
Platelets: 466 10*3/uL — ABNORMAL HIGH (ref 140–400)
RBC: 3.12 10*6/uL — ABNORMAL LOW (ref 4.20–5.82)
RDW: 15.5 % — ABNORMAL HIGH (ref 11.0–14.6)
Retic %: 1.36 % (ref 0.80–1.80)
Retic Ct Abs: 42.43 10*3/uL (ref 34.80–93.90)
WBC: 9.6 10*3/uL (ref 4.0–10.3)
lymph#: 1.2 10*3/uL (ref 0.9–3.3)

## 2016-07-04 MED ORDER — OXYCODONE HCL 5 MG/5ML PO SOLN: 5.0000 mg | ORAL | 0 refills | Status: DC | PRN

## 2016-07-04 MED FILL — oxyCODONE HCL 5 MG/5ML SOLN: 5 | 8 days supply | Qty: 473 | Fill #0

## 2016-07-04 NOTE — Telephone Encounter (Signed)
"  He's mixed up about after his schedule was switched for the feeding tube placement tomorrow.  Does he drink at 6:00 pm and finish by midnight."  Call transferred ext [phone].

## 2016-07-05 ENCOUNTER — Ambulatory Visit (HOSPITAL_COMMUNITY)
Admission: RE | Admit: 2016-07-05 | Discharge: 2016-07-05 | Disposition: A | Discharge status: Home / Self Care | Payer: 59 | Source: Ambulatory Visit | Attending: Hematology | Admitting: Hematology

## 2016-07-05 ENCOUNTER — Telehealth: Payer: Self-pay | Admitting: *Deleted

## 2016-07-05 ENCOUNTER — Encounter (HOSPITAL_COMMUNITY): Payer: Self-pay | Admitting: *Deleted

## 2016-07-05 ENCOUNTER — Emergency Department (HOSPITAL_COMMUNITY)
Admission: EM | Admit: 2016-07-05 | Discharge: 2016-07-06 | Disposition: A | Discharge status: Home / Self Care | Payer: 59 | Source: Home / Self Care | Attending: Emergency Medicine | Admitting: Emergency Medicine

## 2016-07-05 ENCOUNTER — Ambulatory Visit (HOSPITAL_COMMUNITY)
Admission: RE | Admit: 2016-07-05 | Discharge: 2016-07-05 | Disposition: A | Discharge status: Home / Self Care | Payer: 59 | Source: Ambulatory Visit | Attending: Internal Medicine | Admitting: Internal Medicine

## 2016-07-05 ENCOUNTER — Other Ambulatory Visit (HOSPITAL_COMMUNITY): Payer: Self-pay | Admitting: Interventional Radiology

## 2016-07-05 ENCOUNTER — Encounter: Payer: Self-pay | Admitting: *Deleted

## 2016-07-05 ENCOUNTER — Encounter (HOSPITAL_COMMUNITY): Payer: Self-pay

## 2016-07-05 DIAGNOSIS — M25461 Effusion, right knee: Secondary | ICD-10-CM

## 2016-07-05 DIAGNOSIS — Z9221 Personal history of antineoplastic chemotherapy: Secondary | ICD-10-CM | POA: Insufficient documentation

## 2016-07-05 DIAGNOSIS — Z808 Family history of malignant neoplasm of other organs or systems: Secondary | ICD-10-CM | POA: Diagnosis not present

## 2016-07-05 DIAGNOSIS — C154 Malignant neoplasm of middle third of esophagus: Secondary | ICD-10-CM | POA: Insufficient documentation

## 2016-07-05 DIAGNOSIS — Z791 Long term (current) use of non-steroidal anti-inflammatories (NSAID): Secondary | ICD-10-CM

## 2016-07-05 DIAGNOSIS — K9421 Gastrostomy hemorrhage: Secondary | ICD-10-CM | POA: Insufficient documentation

## 2016-07-05 DIAGNOSIS — Z7982 Long term (current) use of aspirin: Secondary | ICD-10-CM

## 2016-07-05 DIAGNOSIS — Z79899 Other long term (current) drug therapy: Secondary | ICD-10-CM | POA: Insufficient documentation

## 2016-07-05 DIAGNOSIS — D473 Essential (hemorrhagic) thrombocythemia: Secondary | ICD-10-CM

## 2016-07-05 DIAGNOSIS — F17200 Nicotine dependence, unspecified, uncomplicated: Secondary | ICD-10-CM | POA: Insufficient documentation

## 2016-07-05 DIAGNOSIS — C159 Malignant neoplasm of esophagus, unspecified: Secondary | ICD-10-CM

## 2016-07-05 DIAGNOSIS — I10 Essential (primary) hypertension: Secondary | ICD-10-CM

## 2016-07-05 DIAGNOSIS — R1319 Other dysphagia: Secondary | ICD-10-CM | POA: Diagnosis not present

## 2016-07-05 DIAGNOSIS — Z923 Personal history of irradiation: Secondary | ICD-10-CM | POA: Diagnosis not present

## 2016-07-05 DIAGNOSIS — E871 Hypo-osmolality and hyponatremia: Secondary | ICD-10-CM | POA: Insufficient documentation

## 2016-07-05 DIAGNOSIS — Z85830 Personal history of malignant neoplasm of bone: Secondary | ICD-10-CM

## 2016-07-05 DIAGNOSIS — D75839 Thrombocytosis, unspecified: Secondary | ICD-10-CM

## 2016-07-05 DIAGNOSIS — Z807 Family history of other malignant neoplasms of lymphoid, hematopoietic and related tissues: Secondary | ICD-10-CM | POA: Insufficient documentation

## 2016-07-05 DIAGNOSIS — D649 Anemia, unspecified: Secondary | ICD-10-CM | POA: Insufficient documentation

## 2016-07-05 DIAGNOSIS — M1990 Unspecified osteoarthritis, unspecified site: Secondary | ICD-10-CM | POA: Insufficient documentation

## 2016-07-05 DIAGNOSIS — T148XXA Other injury of unspecified body region, initial encounter: Secondary | ICD-10-CM

## 2016-07-05 DIAGNOSIS — F17210 Nicotine dependence, cigarettes, uncomplicated: Secondary | ICD-10-CM | POA: Insufficient documentation

## 2016-07-05 HISTORY — PX: IR GENERIC HISTORICAL: IMG1180011

## 2016-07-05 LAB — PROTIME-INR
INR: 1.1
INR: 1.14
Prothrombin Time: 14.3 seconds (ref 11.4–15.2)
Prothrombin Time: 14.6 seconds (ref 11.4–15.2)

## 2016-07-05 LAB — CBC
HEMATOCRIT: 25.6 % — AB (ref 39.0–52.0)
Hemoglobin: 8.5 g/dL — ABNORMAL LOW (ref 13.0–17.0)
MCH: 29.9 pg (ref 26.0–34.0)
MCHC: 33.2 g/dL (ref 30.0–36.0)
MCV: 90.1 fL (ref 78.0–100.0)
Platelets: 660 10*3/uL — ABNORMAL HIGH (ref 150–400)
RBC: 2.84 MIL/uL — ABNORMAL LOW (ref 4.22–5.81)
RDW: 15.6 % — AB (ref 11.5–15.5)
WBC: 10.9 10*3/uL — AB (ref 4.0–10.5)

## 2016-07-05 LAB — COMPREHENSIVE METABOLIC PANEL
ALBUMIN: 3.2 g/dL — AB (ref 3.5–5.0)
ALK PHOS: 102 U/L (ref 38–126)
ALT: 14 U/L — AB (ref 17–63)
ANION GAP: 12 (ref 5–15)
AST: 33 U/L (ref 15–41)
BUN: 8 mg/dL (ref 6–20)
CALCIUM: 8.3 mg/dL — AB (ref 8.9–10.3)
CO2: 24 mmol/L (ref 22–32)
Chloride: 95 mmol/L — ABNORMAL LOW (ref 101–111)
Creatinine, Ser: 0.59 mg/dL — ABNORMAL LOW (ref 0.61–1.24)
GFR calc Af Amer: >60 mL/min (ref >60)
GFR calc non Af Amer: >60 mL/min (ref >60)
GLUCOSE: 100 mg/dL — AB (ref 65–99)
Potassium: 4.1 mmol/L (ref 3.5–5.1)
SODIUM: 131 mmol/L — AB (ref 135–145)
Total Bilirubin: 1 mg/dL (ref 0.3–1.2)
Total Protein: 7.2 g/dL (ref 6.5–8.1)

## 2016-07-05 LAB — I-STAT CG4 LACTIC ACID, ED: Lactic Acid, Venous: 0.76 mmol/L (ref 0.5–1.9)

## 2016-07-05 LAB — CBC WITH DIFFERENTIAL/PLATELET
BASOS ABS: 0 10*3/uL (ref 0.0–0.1)
BASOS PCT: 0 %
EOS ABS: 0 10*3/uL (ref 0.0–0.7)
Eosinophils Relative: 0 %
HEMATOCRIT: 25.2 % — AB (ref 39.0–52.0)
HEMOGLOBIN: 8.4 g/dL — AB (ref 13.0–17.0)
Lymphocytes Relative: 8 %
Lymphs Abs: 1.2 10*3/uL (ref 0.7–4.0)
MCH: 30.4 pg (ref 26.0–34.0)
MCHC: 33.3 g/dL (ref 30.0–36.0)
MCV: 91.3 fL (ref 78.0–100.0)
Monocytes Absolute: 1.3 10*3/uL — ABNORMAL HIGH (ref 0.1–1.0)
Monocytes Relative: 9 %
NEUTROS ABS: 12 10*3/uL — AB (ref 1.7–7.7)
NEUTROS PCT: 83 %
Platelets: 558 10*3/uL — ABNORMAL HIGH (ref 150–400)
RBC: 2.76 MIL/uL — AB (ref 4.22–5.81)
RDW: 15.9 % — ABNORMAL HIGH (ref 11.5–15.5)
WBC: 14.5 10*3/uL — AB (ref 4.0–10.5)

## 2016-07-05 LAB — APTT: aPTT: 41 seconds — ABNORMAL HIGH (ref 24–36)

## 2016-07-05 MED ORDER — HEPARIN SOD (PORK) LOCK FLUSH 100 UNIT/ML IV SOLN
500.0000 [IU] | Freq: Once | INTRAVENOUS | Status: AC
  Administered 2016-07-05: 500 [IU] via INTRAVENOUS
  Filled 2016-07-05: qty 5

## 2016-07-05 MED ORDER — FENTANYL CITRATE (PF) 100 MCG/2ML IJ SOLN
INTRAMUSCULAR | Status: AC | PRN
  Administered 2016-07-05: 50 ug via INTRAVENOUS
  Administered 2016-07-05: 25 ug via INTRAVENOUS
  Administered 2016-07-05 (×4): 50 ug via INTRAVENOUS
  Administered 2016-07-05: 25 ug via INTRAVENOUS

## 2016-07-05 MED ORDER — FENTANYL CITRATE (PF) 100 MCG/2ML IJ SOLN
INTRAMUSCULAR | Status: AC
  Filled 2016-07-05: qty 4

## 2016-07-05 MED ORDER — IOPAMIDOL (ISOVUE-300) INJECTION 61%
10.0000 mL | Freq: Once | INTRAVENOUS | Status: AC | PRN
  Administered 2016-07-05: 10 mL

## 2016-07-05 MED ORDER — MIDAZOLAM HCL 2 MG/2ML IJ SOLN
INTRAMUSCULAR | Status: AC
  Filled 2016-07-05: qty 4

## 2016-07-05 MED ORDER — CEFAZOLIN SODIUM-DEXTROSE 2-4 GM/100ML-% IV SOLN
2.0000 g | Freq: Once | INTRAVENOUS | Status: AC
  Administered 2016-07-05: 2 g via INTRAVENOUS
  Filled 2016-07-05: qty 100

## 2016-07-05 MED ORDER — GLUCAGON HCL RDNA (DIAGNOSTIC) 1 MG IJ SOLR
INTRAMUSCULAR | Status: AC | PRN
  Administered 2016-07-05: 1 mg via INTRAVENOUS

## 2016-07-05 MED ORDER — GLUCAGON HCL RDNA (DIAGNOSTIC) 1 MG IJ SOLR
INTRAMUSCULAR | Status: AC
  Filled 2016-07-05: qty 1

## 2016-07-05 MED ORDER — SODIUM CHLORIDE 0.9 % IV SOLN
INTRAVENOUS | Status: DC
  Administered 2016-07-05: 12:00:00 via INTRAVENOUS

## 2016-07-05 MED ORDER — NALOXONE HCL 0.4 MG/ML IJ SOLN
INTRAMUSCULAR | Status: AC
  Filled 2016-07-05: qty 1

## 2016-07-05 MED ORDER — LIDOCAINE HCL 1 % IJ SOLN
INTRAMUSCULAR | Status: AC
  Filled 2016-07-05: qty 20

## 2016-07-05 MED ORDER — FLUMAZENIL 0.5 MG/5ML IV SOLN
INTRAVENOUS | Status: AC
  Filled 2016-07-05: qty 5

## 2016-07-05 MED ORDER — MIDAZOLAM HCL 2 MG/2ML IJ SOLN
INTRAMUSCULAR | Status: AC | PRN
  Administered 2016-07-05 (×3): 1 mg via INTRAVENOUS
  Administered 2016-07-05: 0.5 mg via INTRAVENOUS
  Administered 2016-07-05: 1 mg via INTRAVENOUS
  Administered 2016-07-05: 0.5 mg via INTRAVENOUS
  Administered 2016-07-05: 1 mg via INTRAVENOUS

## 2016-07-05 MED ORDER — LIDOCAINE HCL 1 % IJ SOLN
INTRAMUSCULAR | Status: AC | PRN
  Administered 2016-07-05: 5 mL

## 2016-07-05 NOTE — ED Notes (Signed)
Large clotting that had formed around feeding tube that was inserted today with interventional radiology. Dr.Glick at bedside and removed clot. Quick clot was applied by Dr.Glick at this time to contain bleeding.

## 2016-07-05 NOTE — Progress Notes (Signed)
Oncology Nurse Navigator Documentation  In response to phone conversation with RN Quentin Angst Short Stay, obtained 1 case Osmolite 1.5 from Nutrition (Progress Note 10/27, Dory Peru, RD), delivered to Samuel Henderson room. RN Webb Silversmith explained to patient/family she will provide instruction for PEG use/care. Answered initial family questions regarding PEG use/care. At patient wife's request, provided my contact information.  Gayleen Orem, RN, BSN, [place] [facility] [phone]

## 2016-07-05 NOTE — Progress Notes (Signed)
Patient's mother, sister, and brother are present for instruction on tube feedings, flushes, and dressing changes to G tube site. They verbalize understanding.   Dressing has a lot of bloody drainage with small clots from G tube insertion site. Dr Barbie Banner had to use stitches and family understands appointment with Interventional Radiology to remove stitches. New dressing applied using 4x4 gauze and tegaderm tape.   Gayleen Orem , navigator, brought over osmolite to begin tube feedings starting 07/06/16.

## 2016-07-05 NOTE — Telephone Encounter (Signed)
Left VM for nurse, Samuel Henderson caring for patient today to call navigator to determine if I am needed to send his feeding orders to [facility] to get formula shipped and supplies. Would benefit from a one time RN visit to ensure he understands procedure. Left direct # for navigator's office.

## 2016-07-05 NOTE — Procedures (Signed)
18 Fr gastrostomy No comp/EBL

## 2016-07-05 NOTE — ED Notes (Signed)
Dr.Glick at bedside to reevaluate feeding tube bleeding. No new bleeding noted after application of quick clot and combat gauze. Family at bedside concerned for possibility of bleeding to return once at home and asking for possible observation overnight.

## 2016-07-05 NOTE — ED Triage Notes (Signed)
Pt reports bleeding from surgical site where a feeding tube was placed in his left lower abdomen today. Pt has hx of esophageal cancer.

## 2016-07-05 NOTE — Telephone Encounter (Signed)
RN called patient to inform him of directions. Patient family member verbalized understanding.

## 2016-07-05 NOTE — ED Notes (Signed)
ED Provider at bedside. 

## 2016-07-05 NOTE — Telephone Encounter (Signed)
Per LOS I have scheduled appts and notified the scheduler. Need to move lab

## 2016-07-05 NOTE — H&P (Signed)
Chief Complaint: dysphagia secondary to esophageal cancer  Referring Physician:Dr. Zenovia Jarred  Supervising Physician: Marybelle Killings  Patient Status: [facility] - Out-pt  HPI: Samuel Henderson is an 56 y.o. male who has a history of esophageal cancer who is managed by Dr. Irene Limbo.  This was diagnosed in January of this year.  He has undergone chemotherapy and radiation.  However, he is continuing to have worsening dysphagia.  Dr. Hilarie Fredrickson recently did another EGD and noted a recurrence of his tumor causing considerable esophageal luminal narrowing.  For nutritional purposes, it was discussed and felt that a g-tube would be less risky than trying to place a stent.  The patient presents today for this procedure to be completed.  Past Medical History:  Past Medical History:  Diagnosis Date  . Arthritis   . Bone cancer ([place])   . Esophageal cancer ([place])   . History of chemotherapy   . History of radiation therapy   . Hypertension   . Knee pain     Past Surgical History:  Past Surgical History:  Procedure Laterality Date  . ESOPHAGOGASTRODUODENOSCOPY (EGD) WITH ESOPHAGEAL DILATION     and biopsy  . IRRIGATION AND DEBRIDEMENT KNEE Right 04/14/2016   Procedure: IRRIGATION AND DEBRIDEMENT RIGHT KNEE, ARTHROTOMY;  Surgeon: Paralee Cancel, MD;  Location: [facility] ORS;  Service: Orthopedics;  Laterality: Right;  . KNEE SURGERY    . LESION REMOVAL  1969   hip    Family History:  Family History  Problem Relation Age of Onset  . Esophageal cancer Father   . Cancer Father     nasopharyngeal   . Diabetes Sister   . Cancer Sister     multiple myeloma  . Colon cancer Neg Hx     Social History:  reports that he has been smoking Cigarettes.  He has a 10.00 pack-year smoking history. He has never used smokeless tobacco. He reports that he uses drugs, including Marijuana. He reports that he does not drink alcohol.  Allergies: No Known Allergies  Medications: Medications reviewed in Epic  Please HPI for  pertinent positives, otherwise complete 10 system ROS negative.  Mallampati Score: MD Evaluation Airway: WNL Heart: WNL Abdomen: WNL Chest/ Lungs: WNL ASA  Classification: 3 Mallampati/Airway Score: Two  Physical Exam: BP (!) 149/88 (BP Location: Left Arm)   Pulse (!) 107   Temp 100 F (37.8 C) (Oral)   Resp 16   SpO2 96%  There is no height or weight on file to calculate BMI. General: pleasant, WD, WN black male who is laying in bed in NAD HEENT: head is normocephalic, atraumatic.  Sclera are noninjected.  PERRL.  Ears and nose without any masses or lesions.  Mouth is pink and moist Heart: regular, rate, and rhythm.  Normal s1,s2. No obvious murmurs, gallops, or rubs noted.  Palpable radial and pedal pulses bilaterally Lungs: CTAB, no wheezes, rhonchi, or rales noted.  Respiratory effort nonlabored Abd: soft, NT, ND, +BS, no masses, hernias, or organomegaly Psych: A&Ox3 with an appropriate affect.   Labs: Results for orders placed or performed in visit on 07/04/16 (from the past 48 hour(s))  Comprehensive metabolic panel     Status: Abnormal   Collection Time: 07/04/16  9:55 AM  Result Value Ref Range   Sodium 133 (L) 136 - 145 mEq/L   Potassium 4.4 3.5 - 5.1 mEq/L   Chloride 93 (L) 98 - 109 mEq/L   CO2 27 22 - 29 mEq/L   Glucose 115 70 -  140 mg/dl    Comment: Glucose reference range is for nonfasting patients. Fasting glucose reference range is 70- 100.   BUN 6.4 (L) 7.0 - 26.0 mg/dL   Creatinine 0.7 0.7 - 1.3 mg/dL   Total Bilirubin 0.36 0.20 - 1.20 mg/dL   Alkaline Phosphatase 124 40 - 150 U/L   AST 33 5 - 34 U/L   ALT 13 0 - 55 U/L   Total Protein 7.7 6.4 - 8.3 g/dL   Albumin 3.0 (L) 3.5 - 5.0 g/dL   Calcium 9.5 8.4 - 10.4 mg/dL   Anion Gap 13 (H) 3 - 11 mEq/L   EGFR >90 >90 ml/min/1.73 m2    Comment: eGFR is calculated using the CKD-EPI Creatinine Equation (2009)  CBC & Diff and Retic     Status: Abnormal   Collection Time: 07/04/16  9:55 AM  Result Value Ref  Range   WBC 9.6 4.0 - 10.3 10e3/uL   NEUT# 6.8 (H) 1.5 - 6.5 10e3/uL   HGB 9.4 (L) 13.0 - 17.1 g/dL   HCT 28.4 (L) 38.4 - 49.9 %   Platelets 466 (H) 140 - 400 10e3/uL   MCV 91.0 79.3 - 98.0 fL   MCH 30.1 27.2 - 33.4 pg   MCHC 33.1 32.0 - 36.0 g/dL   RBC 3.12 (L) 4.20 - 5.82 10e6/uL   RDW 15.5 (H) 11.0 - 14.6 %   lymph# 1.2 0.9 - 3.3 10e3/uL   MONO# 1.5 (H) 0.1 - 0.9 10e3/uL   Eosinophils Absolute 0.1 0.0 - 0.5 10e3/uL   Basophils Absolute 0.0 0.0 - 0.1 10e3/uL   NEUT% 70.9 39.0 - 75.0 %   LYMPH% 12.5 (L) 14.0 - 49.0 %   MONO% 15.8 (H) 0.0 - 14.0 %   EOS% 0.5 0.0 - 7.0 %   BASO% 0.3 0.0 - 2.0 %   Retic % 1.36 0.80 - 1.80 %   Retic Ct Abs 42.43 34.80 - 93.90 10e3/uL   Immature Retic Fract 16.10 (H) 3.00 - 10.60 %    Imaging: Dg Esophagus  Result Date: 07/03/2016 CLINICAL DATA:  55 year old male with esophageal cancer. Finished chemotherapy 2 - 3 months ago and radiation therapy 1 month ago. Subsequent encounter. EXAM: ESOPHOGRAM/BARIUM SWALLOW TECHNIQUE: Single contrast examination was performed using  thin barium. FLUOROSCOPY TIME:  Fluoroscopy Time:  18 seconds Radiation Exposure Index (if provided by the fluoroscopic device): 15.8 mGy Number of Acquired Spot Images: 38 COMPARISON:  06/05/2016 CT. FINDINGS: No laryngeal penetration or aspiration. Mild narrowing cervical esophagus. Irregular stricture mid to distal third of the esophagus consistent with patient's known malignancy spans over 4.6 cm. Inferior extent located 8.2 cm proximal to the gastroesophageal junction. No obstruction at the level of the gastroesophageal junction. IMPRESSION: Irregular stricture mid to distal third of the esophagus consistent with patient's known malignancy spans over 4.6 cm. Inferior extent located 8.2 cm proximal to the gastroesophageal junction. Electronically Signed   By: Genia Del M.D.   On: 07/03/2016 14:51    Assessment/Plan 1. Dysphagia secondary to recurrence of esophageal cancer -we  will plan to place a g-tube today for the patient to be able to get nutrition. -labs from yesterday reviewed.  Awaiting PT/INR today -Risks and Benefits discussed with the patient including, but not limited to the need for a barium enema during the procedure, bleeding, infection, peritonitis, or damage to adjacent structures. All of the patient's questions were answered, patient is agreeable to proceed. Consent signed and in chart.   Thank you for  this interesting consult.  I greatly enjoyed meeting Samuel Henderson and look forward to participating in their care.  A copy of this report was sent to the requesting provider on this date.  Electronically Signed: Henreitta Cea 07/05/2016, 12:24 PM   I spent a total of  30 Minutes   in face to face in clinical consultation, greater than 50% of which was counseling/coordinating care for dysphagia secondary to esophageal cancer

## 2016-07-05 NOTE — ED Provider Notes (Signed)
[facility] DEPT Provider Note   CSN: [id number] Arrival date & time: 07/05/16  2106     History   Chief Complaint Chief Complaint  Patient presents with  . Post-op Problem    HPI Samuel Henderson is a 55 y.o. male.  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 This afternoon, he had a feeding tube inserted by interventional radiology. There is been persistent bleeding from the insertion site since the tube was placed. Family does relate that he has been told that there was hepatic spread of tumor. He denies any recent bleeding problems but states that he was "a free bleeder" when he was much younger. He is not on any anticoagulants or antiplatelet agents.  Second complaint is pain and swelling of the right knee. He states that the knee had been drained by his orthopedic physician and he had been on some antibiotics for that.   The history is provided by the patient.    Past Medical History:  Diagnosis Date  . Arthritis   . Bone cancer ([place])   . Esophageal cancer ([place])   . History of chemotherapy   . History of radiation therapy   . Hypertension   . Knee pain     Patient Active Problem List   Diagnosis Date Noted  . Fever 05/13/2016  . SIRS (systemic inflammatory response syndrome) ([place]) 05/13/2016  . Septic joint of right knee joint ([place]) 04/14/2016  . Port catheter in place 02/08/2016  . Bone metastases ([place]) 02/08/2016  . Dehydration 11/16/2015  . URI (upper respiratory infection) 11/16/2015  . Liver metastases ([place]) 10/27/2015  . Hypokalemia 10/16/2015  . Squamous cell cancer of lower third of esophagus ([place]) 09/15/2015  . 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 Dysphagia 09/15/2015  . HTN (hypertension) 09/02/2015    Past Surgical History:  Procedure Laterality Date  . ESOPHAGOGASTRODUODENOSCOPY (EGD) WITH ESOPHAGEAL DILATION     and biopsy  . IR GENERIC HISTORICAL  07/05/2016   IR GASTROSTOMY  TUBE MOD SED 07/05/2016 [facility]-INTERV RAD  . IRRIGATION AND DEBRIDEMENT KNEE Right 04/14/2016   Procedure: IRRIGATION AND DEBRIDEMENT RIGHT KNEE, ARTHROTOMY;  Surgeon: Paralee Cancel, MD;  Location: [facility] ORS;  Service: Orthopedics;  Laterality: Right;  . KNEE SURGERY    . LESION REMOVAL  1969   hip       Home Medications    Prior to Admission medications   Medication Sig Start Date End Date Taking? Authorizing Provider  acetaminophen (TYLENOL) 500 MG tablet Take 1,000 mg by mouth every 6 (six) hours as needed for moderate pain or headache.    Historical Provider, MD  amLODipine (NORVASC) 5 MG tablet Take 5 mg by mouth daily. 04/25/16   Historical Provider, MD  Calcium Carbonate-Vitamin D (CALCIUM 600+D PO) Take 1 tablet by mouth daily.    Historical Provider, MD  chlorhexidine (PERIDEX) 0.12 % solution Use as directed 15 mLs in the mouth or throat 2 (two) times daily. Swish and spit. 05/09/16   Brunetta Genera, MD  docusate sodium (COLACE) 100 MG capsule Take 100 mg by mouth daily as needed for mild constipation.    Historical Provider, MD  gabapentin (NEURONTIN) 100 MG capsule 123m po BID and 3070mpo HS 06/20/16   GaBrunetta GeneraMD  lidocaine-prilocaine (EMLA) cream Apply 1 application topically once. 12/07/15 12/06/16  GaBrunetta GeneraMD  LORazepam (ATIVAN) 0.5 MG tablet TAKE 1 TABLET BY MOUTH EVERY 6 HOURS AS NEEDED FOR  NAUSEA AND VOMITING 05/12/16   Brunetta Genera, MD  methocarbamol (ROBAXIN) 500 MG tablet Take 500 mg by mouth daily as needed for muscle spasms.  04/17/16   Historical Provider, MD  omeprazole (PRILOSEC) 20 MG capsule TAKE ONE CAPSULE BY MOUTH TWICE DAILY BEFORE A MEAL Patient taking differently: TAKE 33m BY MOUTH TWICE DAILY BEFORE A MEAL when needed for acid reflux/ heartburn 02/17/16   GBrunetta Genera MD  ondansetron (ZOFRAN) 8 MG tablet Take 1 tablet (8 mg total) by mouth every 8 (eight) hours as needed for nausea or vomiting. 05/09/16   GBrunetta Genera MD    oxyCODONE (ROXICODONE) 5 MG/5ML solution Take 5-10 mLs (5-10 mg total) by mouth every 4 (four) hours as needed for moderate pain or severe pain. 07/04/16   GBrunetta Genera MD  polyethylene glycol (MIRALAX / GLYCOLAX) packet Take 17 g by mouth daily. 05/09/16   GBrunetta Genera MD  prochlorperazine (COMPAZINE) 10 MG tablet Take 10 mg by mouth daily as needed for nausea.  03/20/16   Historical Provider, MD  senna-docusate (SENNA S) 8.6-50 MG tablet Take 2 tablets by mouth at bedtime. 05/09/16   GBrunetta Genera MD  VIAGRA 100 MG tablet Take 100 mg by mouth daily as needed for erectile dysfunction. Reported on 10/22/2015 09/10/15   Historical Provider, MD    Family History Family History  Problem Relation Age of Onset  . Esophageal cancer Father   . Cancer Father     nasopharyngeal   . Diabetes Sister   . Cancer Sister     multiple myeloma  . Colon cancer Neg Hx     Social History Social History  Substance Use Topics  . Smoking status: Current Every Day Smoker    Packs/day: 0.50    Years: 20.00    Types: Cigarettes  . Smokeless tobacco: Never Used  . Alcohol use No     Allergies   Review of patient's allergies indicates no known allergies.   Review of Systems Review of Systems  All other systems reviewed and are negative.    Physical Exam Updated Vital Signs BP 126/86 (BP Location: Left Arm)   Pulse (!) 139   Temp 99.7 F (37.6 C) (Oral)   Resp 18   SpO2 96%   Physical Exam  Nursing note and vitals reviewed.  55year old male, resting comfortably and in no acute distress. Vital signs are Significant for tachycardia. Oxygen saturation is 96%, which is normal. Head is normocephalic and atraumatic. PERRLA, EOMI. Oropharynx is clear. Neck is nontender and supple without adenopathy or JVD. Back is nontender and there is no CVA tenderness. Lungs are clear without rales, wheezes, or rhonchi. Chest is nontender. Heart is tachycardic without murmur. Abdomen is  soft, flat, nontender without masses or hepatosplenomegaly and peristalsis is normoactive. Feeding tube is seen in the left upper abdomen with steady trickle of blood coming from the tube. There is a large clot present. Extremities have no cyanosis or edema, full range of motion is present. Moderate size effusion is present in the right knee without any erythema or warmth. Skin is warm and dry without rash. Neurologic: Mental status is normal, cranial nerves are intact, there are no motor or sensory deficits.  ED Treatments / Results  Labs (all labs ordered are listed, but only abnormal results are displayed) Labs Reviewed  CBC WITH DIFFERENTIAL/PLATELET - Abnormal; Notable for the following:       Result Value   WBC 14.5 (*)  RBC 2.76 (*)    Hemoglobin 8.4 (*)    HCT 25.2 (*)    RDW 15.9 (*)    Platelets 558 (*)    Neutro Abs 12.0 (*)    Monocytes Absolute 1.3 (*)    All other components within normal limits  COMPREHENSIVE METABOLIC PANEL - Abnormal; Notable for the following:    Sodium 131 (*)    Chloride 95 (*)    Glucose, Bld 100 (*)    Creatinine, Ser 0.59 (*)    Calcium 8.3 (*)    Albumin 3.2 (*)    ALT 14 (*)    All other components within normal limits  PROTIME-INR  CBC WITH DIFFERENTIAL/PLATELET  I-STAT CG4 LACTIC ACID, ED  TYPE AND SCREEN   Radiology Ir Gastrostomy Tube Mod Sed  Result Date: 07/05/2016 INDICATION: Esophageal cancer EXAM: PERC PLACEMENT GASTROSTOMY MEDICATIONS: Ancef 2 g; Antibiotics were administered within 1 hour of the procedure. Glucagon 1 mg IV. ANESTHESIA/SEDATION: Versed 6 mg IV; Fentanyl 300 mcg IV Moderate Sedation Time:  40 The patient was continuously monitored during the procedure by the interventional radiology nurse under my direct supervision. CONTRAST:  71m ISOVUE-300 IOPAMIDOL (ISOVUE-300) INJECTION 61% - administered into the gastric lumen. FLUOROSCOPY TIME:  Fluoroscopy Time: 8 minutes 30 seconds (57 mGy). COMPLICATIONS: None  immediate. PROCEDURE: The procedure, risks, benefits, and alternatives were explained to the patient. Questions regarding the procedure were encouraged and answered. The patient understands and consents to the procedure. The epigastrium was prepped with Betadine in a sterile fashion, and a sterile drape was applied covering the operative field. A sterile gown and sterile gloves were used for the procedure. A 5-French orogastric tube is placed under fluoroscopic guidance. Scout imaging of the abdomen confirms barium within the transverse colon. The stomach was distended with gas. Under fluoroscopic guidance, an 18 gauge needle was utilized to puncture the anterior wall of the body of the stomach. An Amplatz wire was advanced through the needle passing a T fastener into the lumen of the stomach. T8T fasteners were secured for gastropexy. The 18 gauge needle was then advanced into the lumen of the stomach and removed over an Amplatz wire. The graduated 18 French peel-away sheath was then advanced over the Amplatz. An 149French gastrostomy tube was advanced over the wire and through the peel-away sheath. The balloon was insufflated with 10 cc saline and secured in place. Contrast was injected. Findings The image demonstrates placement of a 129French balloon retention type gastrostomy tube into the body of the stomach. IMPRESSION: Successful 18 French balloon retention gastrostomy. Electronically Signed   By: AMarybelle KillingsM.D.   On: 07/05/2016 15:31    Procedures Procedures (including critical care time)  Medications Ordered in ED Medications - No data to display   Initial Impression / Assessment and Plan / ED Course  I have reviewed the triage vital signs and the nursing notes.  Pertinent labs & imaging results that were available during my care of the patient were reviewed by me and considered in my medical decision making (see chart for details).  Clinical Course   Bleeding from his feeding tube  insertion site. Old records are reviewed confirming presence of recurrent esophageal cancer with known hepatic metastases. However, recent metabolic panel showed normal liver function. Doubt significant coagulopathy from liver metastases but will check prothrombin time. Tachycardia could be related to blood loss but I doubt that he has had that significant amount of blood loss. Family states that although  treatment is palliative at this point, they would want blood transfusions if needed. Will check hemoglobin to see if there is been a significant drop. Quick Clot was applied to try to achieve hemostasis.  There was excellent hemostasis following the application of Quick Clot. Patient was observed for 2 hours without any recurrence of bleeding. Hemoglobin is unchanged from preprocedure hemoglobin. INR is normal. Thrombocytosis is present but has been chronic. Mild hyponatremia is present which has also been chronic.  Because of adequate hemostasis, I have recommended that he go home and follow-up with the interventional radiologist to change the dressing. I'm concerned that if he tries to remove the quick clot, he may precipitate bleeding again. He is to contact interventional radiology office in the morning to get followed up. His wife stated that she was uncomfortable with him going home and I did offer to discuss with hospitalist whether he could be an observation admit, the patient stated that he wished to go home. They're advised to return if he has recurrence of bleeding.  Right knee effusion is present but without evidence of significant inflammation. He is referred back to his orthopedic physician for that.  Final Clinical Impressions(s) / ED Diagnoses   Final diagnoses:  Bleeding from wound  Effusion of right knee  Normochromic normocytic anemia  Hyponatremia  Thrombocytosis (HCC)  Carcinoma of esophagus (HCC)    New Prescriptions New Prescriptions   No medications on file     Delora Fuel, MD 88/67/73 [id number]

## 2016-07-05 NOTE — Discharge Instructions (Signed)
Gastrostomy Tube Home Guide, Adult A gastrostomy tube is a tube that is surgically placed into the stomach. It is also called a "G-tube." G-tubes are used when a person is unable to eat and drink enough on their own to stay healthy. The tube is inserted into the stomach through a small cut (incision) in the skin. This tube is used for:  Feeding.  Giving medication. GASTROSTOMY TUBE CARE  Wash your hands with soap and water.  Remove the old dressing (if any). Some styles of G-tubes may need a dressing inserted between the skin and the G-tube. Other types of G-tubes do not require a dressing. Ask your health care provider if a dressing is needed.  Check the area where the tube enters the skin (insertion site) for redness, swelling, or pus-like (purulent) drainage. A small amount of clear or tan liquid drainage is normal. Check to make sure scar tissue (skin) is not growing around the insertion site. This could have a raised, bumpy appearance.  A cotton swab can be used to clean the skin around the tube:  When the G-tube is first put in, a normal saline solution or water can be used to clean the skin.  Mild soap and warm water can be used when the skin around the G-tube site has healed.  Roll the cotton swab around the G-tube insertion site to remove any drainage or crusting at the insertion site. STOMACH RESIDUALS Feeding tube residuals are the amount of liquids that are in the stomach at any given time. Residuals may be checked before giving feedings, medications, or as instructed by your health care provider.  Ask your health care provider if there are instances when you would not start tube feedings depending on the amount or type of contents withdrawn from the stomach.  Check residuals by attaching a syringe to the G-tube and pulling back on the syringe plunger. Note the amount, and return the residual back into the stomach. FLUSHING THE G-TUBE  The G-tube should be periodically  flushed with clean warm water to keep it from clogging.  Flush the G-tube after feedings or medications. Draw up 30 mL of warm water in a syringe. Connect the syringe to the G-tube and slowly push the water into the tube.  Do not push feedings, medications, or flushes rapidly. Flush the G-tube gently and slowly.  Only use syringes made for G-tubes to flush medications or feedings.  Your health care provider may want the G-tube flushed more often or with more water. If this is the case, follow your health care provider's instructions. FEEDINGS Your health care provider will determine whether feedings are given as a bolus (a certain amount given at one time and at scheduled times) or whether feedings will be given continuously on a feeding pump.   Formulas should be given at room temperature.  If feedings are continuous, no more than 4 hours worth of feedings should be placed in the feeding bag. This helps prevent spoilage or accidental excess infusion.  Cover and place unused formula in the refrigerator.  If feedings are continuous, stop the feedings when medications or flushes are given. Be sure to restart the feedings.  Feeding bags and syringes should be replaced as instructed by your health care provider. GIVING MEDICATION   In general, it is best if all medications are in a liquid form for G-tube administration. Liquid medications are less likely to clog the G-tube.  Mix the liquid medication with 30 mL (or amount recommended  by your health care provider) of warm water.  Draw up the medication into the syringe.  Attach the syringe to the G-tube and slowly push the mixture into the G-tube.  After giving the medication, draw up 30 mL of warm water in the syringe and slowly flush the G-tube.  For pills or capsules, check with your health care provider first before crushing medications. Some pills are not effective if they are crushed. Some capsules are sustained-release  medications.  If appropriate, crush the pill or capsule and mix with 30 mL of warm water. Using the syringe, slowly push the medication through the tube, then flush the tube with another 30 mL of tap water. G-TUBE PROBLEMS G-tube was pulled out.  Cause: May have been pulled out accidentally.  Solutions: Cover the opening with clean dressing and tape. Call your health care provider right away. The G-tube should be put in as soon as possible (within 4 hours) so the G-tube opening (tract) does not close. The G-tube needs to be put in at a health care setting. An X-ray needs to be done to confirm placement before the G-tube can be used again. Redness, irritation, soreness, or foul odor around the gastrostomy site.  Cause: May be caused by leakage or infection.  Solutions: Call your health care provider right away. Large amount of leakage of fluid or mucus-like liquid present (a large amount means it soaks clothing).  Cause: Many reasons could cause the G-tube to leak.  Solutions: Call your health care provider to discuss the amount of leakage. Skin or scar tissue appears to be growing where tube enters skin.   Cause: Tissue growth may develop around the insertion site if the G-tube is moved or pulled on excessively.  Solutions: Secure tube with tape so that excess movement does not occur. Call your health care provider. G-tube is clogged.  Cause: Thick formula or medication.  Solutions: Try to slowly push warm water into the tube with a large syringe. Never try to push any object into the tube to unclog it. Do not force fluid into the G-tube. If you are unable to unclog the tube, call your health care provider right away. TIPS  Head of bed (HOB) position refers to the upright position of a person's upper body.  When giving medications or a feeding bolus, keep the [facility] up as told by your health care provider. Do this during the feeding and for 1 hour after the feeding or medication  administration.  If continuous feedings are being given, it is best to keep the [facility] up as told by your health care provider. When ADLs (activities of daily living) are performed and the [facility] needs to be flat, be sure to turn the feeding pump off. Restart the feeding pump when the [facility] is returned to the recommended height.  Do not pull or put tension on the tube.  To prevent fluid backflow, kink the G-tube before removing the cap or disconnecting a syringe.  Check the G-tube length every day. Measure from the insertion site to the end of the G-tube. If the length is longer than previous measurements, the tube may be coming out. Call your health care provider if you notice increasing G-tube length.  Oral care, such as brushing teeth, must be continued.  You may need to remove excess air (vent) from the G-tube. Your health care provider will tell you if this is needed.  Always call your health care provider if you have questions or problems with  the G-tube. SEEK IMMEDIATE MEDICAL CARE IF:   You have severe abdominal pain, tenderness, or abdominal bloating (distension).  You have nausea or vomiting.  You are constipated or have problems moving your bowels.  The G-tube insertion site is red, swollen, has a foul smell, or has yellow or brown drainage.  You have difficulty breathing or shortness of breath.  You have a fever.  You have a large amount of feeding tube residuals.  The G-tube is clogged and cannot be flushed. MAKE SURE YOU:   Understand these instructions.  Will watch your condition.  Will get help right away if you are not doing well or get worse.   This information is not intended to replace advice given to you by your health care provider. Make sure you discuss any questions you have with your health care provider.   Document Released: 10/30/2001 Document Revised: 01/05/2015 Document Reviewed: 04/28/2013 Elsevier Interactive Patient Education 2016 [employer] Moderate Conscious Sedation, Adult, Care After Refer to this sheet in the next few weeks. These instructions provide you with information on caring for yourself after your procedure. Your health care provider may also give you more specific instructions. Your treatment has been planned according to current medical practices, but problems sometimes occur. Call your health care provider if you have any problems or questions after your procedure. WHAT TO EXPECT AFTER THE PROCEDURE  After your procedure:  You may feel sleepy, clumsy, and have poor balance for several hours.  Vomiting may occur if you eat too soon after the procedure. HOME CARE INSTRUCTIONS  Do not participate in any activities where you could become injured for at least 24 hours. Do not:  Drive.  Swim.  Ride a bicycle.  Operate heavy machinery.  Cook.  Use power tools.  Climb ladders.  Work from a high place.  Do not make important decisions or sign legal documents until you are improved.  If you vomit, drink water, juice, or soup when you can drink without vomiting. Make sure you have little or no nausea before eating solid foods.  Only take over-the-counter or prescription medicines for pain, discomfort, or fever as directed by your health care provider.  Make sure you and your family fully understand everything about the medicines given to you, including what side effects may occur.  You should not drink alcohol, take sleeping pills, or take medicines that cause drowsiness for at least 24 hours.  If you smoke, do not smoke without supervision.  If you are feeling better, you may resume normal activities 24 hours after you were sedated.  Keep all appointments with your health care provider. SEEK MEDICAL CARE IF:  Your skin is pale or bluish in color.  You continue to feel nauseous or vomit.  Your pain is getting worse and is not helped by medicine.  You have bleeding or swelling.  You are  still sleepy or feeling clumsy after 24 hours. SEEK IMMEDIATE MEDICAL CARE IF:  You develop a rash.  You have difficulty breathing.  You develop any type of allergic problem.  You have a fever. MAKE SURE YOU:  Understand these instructions.  Will watch your condition.  Will get help right away if you are not doing well or get worse.   This information is not intended to replace advice given to you by your health care provider. Make sure you discuss any questions you have with your health care provider.   Document Released: 06/11/2013 Document Revised: 09/11/2014 Document Reviewed:  06/11/2013 Elsevier Interactive Patient Education 2016 [place] were given osmolite 1.5 to begin. You may have to purchase this tube feeding in the future.  Day one:  Give 1/2 cup of tap water, 1/2 can osmolite, then 1/2 cup of tap water. Do this 4 times a day at least 4 hours apart. Hold if you get nauseated, diarrhea, or bloating.  Day two:  Same as above.   Day three:  Give 1/2 cup of tap water, 3/4 can osmolite, then 1/2 cup of tap water. Do this 4 times a day. Hold if you have any of the above.  Day four:    Same as above.  Day five:   Give 1/2 cup of tap water, 1 can osmolite, then 1/2 cup of tap  water. Do this 4 times a day. Hold if you have any of the above.  Day six:   Same as above.  Day seven:  Give 1/2 cup of tap water, 1.5 can osmolie, then 1/2 cup of tap water. Do this 4 times a day. Hold if you have any symptoms like above.    You should drink or take 1 cup of tap water through your feeding tube.

## 2016-07-06 ENCOUNTER — Other Ambulatory Visit (HOSPITAL_COMMUNITY): Payer: Self-pay | Admitting: General Surgery

## 2016-07-06 ENCOUNTER — Telehealth: Payer: Self-pay | Admitting: General Surgery

## 2016-07-06 DIAGNOSIS — C159 Malignant neoplasm of esophagus, unspecified: Secondary | ICD-10-CM

## 2016-07-06 DIAGNOSIS — C154 Malignant neoplasm of middle third of esophagus: Secondary | ICD-10-CM | POA: Diagnosis not present

## 2016-07-06 LAB — TYPE AND SCREEN
ABO/RH(D): O POS
ANTIBODY SCREEN: NEGATIVE

## 2016-07-06 MED ORDER — HEPARIN SOD (PORK) LOCK FLUSH 100 UNIT/ML IV SOLN
500.0000 [IU] | Freq: Once | INTRAVENOUS | Status: AC
  Administered 2016-07-06: 500 [IU]
  Filled 2016-07-06: qty 5

## 2016-07-06 NOTE — Progress Notes (Signed)
Patient had some oozing from his g-tube site over night.  His mother took him to the ED where they place QuikClot around the tube.  His hgb was stable and his PT/INR were stable as well.  According to the ED provider's note the amount of bleeding did not appear too significant.  The patient stopped bleeding after application and he was discharged home.  I have called today and he has no further bleeding.  They wish to come in and have [place] remove his QuikClot and redress his g-tube.  We will do this tomorrow 11-3 at 1400.    Caliah Kopke E 10:57 AM 07/06/2016

## 2016-07-06 NOTE — Discharge Instructions (Signed)
Keep the dressing in place until you are seen by the interventional radiologist. If bleeding starts again, then returned to the emergency department.  Please call your orthopedic doctor to have him evaluate your knee swelling.

## 2016-07-07 ENCOUNTER — Encounter: Payer: Self-pay | Admitting: *Deleted

## 2016-07-07 ENCOUNTER — Ambulatory Visit (HOSPITAL_COMMUNITY): Payer: 59

## 2016-07-07 ENCOUNTER — Ambulatory Visit (HOSPITAL_COMMUNITY)
Admission: RE | Admit: 2016-07-07 | Discharge: 2016-07-07 | Disposition: A | Discharge status: Home / Self Care | Payer: 59 | Source: Ambulatory Visit | Attending: Physician Assistant | Admitting: Physician Assistant

## 2016-07-07 DIAGNOSIS — C159 Malignant neoplasm of esophagus, unspecified: Secondary | ICD-10-CM

## 2016-07-07 DIAGNOSIS — C155 Malignant neoplasm of lower third of esophagus: Secondary | ICD-10-CM

## 2016-07-07 DIAGNOSIS — Z431 Encounter for attention to gastrostomy: Secondary | ICD-10-CM | POA: Insufficient documentation

## 2016-07-07 HISTORY — PX: IR GENERIC HISTORICAL: IMG1180011

## 2016-07-07 NOTE — Progress Notes (Signed)
Left VM at home # to have him call in next hour if he is having any trouble with his tube feedings at this time. Informed him that [facility] has been called to deliver formula, supplies and a nurse visit. Left direct # for return call.

## 2016-07-07 NOTE — Progress Notes (Signed)
Oncology Nurse Navigator Documentation  Oncology Nurse Navigator Flowsheets 07/07/2016  Navigator Location [facility]  Referral date to RadOnc/MedOnc -  Navigator Encounter Type Telephone  Telephone Outgoing Call  Abnormal Finding Date -  Confirmed Diagnosis Date -  Treatment Initiated Date -  Patient Visit Type -  Treatment Phase -  Barriers/Navigation Needs Coordination of Care--Tube feedings at home  Education -  Interventions Coordination of Care;Referrals  Referrals Home Health--[facility]--tube feeding orders (per Ernestene Kiel, RD) transcribed into EPIC order as well as request for one RN visit to reinforce teaching. Called Jermaine @ [facility] to alert him of urgent referral. Left contact # of navigator for him to call.   Education Method -  Support Groups/Services -  Specialty Items/DME PEG care supplies  Acuity Level 2  Acuity Level 2 -  Time Spent with Patient 30

## 2016-07-07 NOTE — Progress Notes (Signed)
  Mr Zurcher is here today for recheck of his recently placed gastrostomy tube.  It was oozing post-operatively and QuickClot was placed around the tube.  I removed this today without difficult, examined the site = looks good, no bleeding) and re-dressed the g-tube with 4x4 drain sponges and Hypafix tape.  I also instructed his wife how to flush the tube.  Chantel Teti S Wissam Resor PA-C 07/07/2016 2:53 PM

## 2016-07-10 ENCOUNTER — Ambulatory Visit: Payer: 59 | Admitting: Nutrition

## 2016-07-10 ENCOUNTER — Encounter (HOSPITAL_COMMUNITY): Payer: Self-pay | Admitting: Radiology

## 2016-07-10 NOTE — Progress Notes (Signed)
Nutrition follow-up completed with patient status post G tube placement.  Patient has recurrent esophageal cancer and dysphasia. Weight has decreased was documented as 117 pounds on November 1, down from 132 pounds September 2017. Patient states he has tolerated one half can of Osmolite 1.5-4 times a day with 120 cc free water before and after bolus feedings. He reports he can sometimes eat very soft foods such as scrambled eggs. Labs were reviewed.  Estimated nutrition needs: 1900-2100 calories, 78-90 grams protein, 2.1 L fluid.  Nutrition diagnosis: Unintended weight loss continues.  Intervention: Educated patient to increase Osmolite 1.5 to one can 4 times a day with 120 cc free water before and after feedings. If tolerated patient will increase to 1-1/2 cans 4 times a day on Wednesday, November 8. Observed patient administering bolus feeding with good technique. Questions were answered and teach back method was used. Patient should continue to drink at least 240 cc free water daily.  Monitoring, evaluation, goals: Patient will increase tube feeding to goal rate to meet estimated nutrition needs for repletion.  Next visit: Thursday, November 9, during infusion.  **Disclaimer: This note was dictated with voice recognition software. Similar sounding words can inadvertently be transcribed and this note may contain transcription errors which may not have been corrected upon publication of note.**

## 2016-07-11 ENCOUNTER — Encounter (HOSPITAL_COMMUNITY): Payer: Self-pay

## 2016-07-12 ENCOUNTER — Other Ambulatory Visit: Payer: Self-pay | Admitting: *Deleted

## 2016-07-12 DIAGNOSIS — C787 Secondary malignant neoplasm of liver and intrahepatic bile duct: Secondary | ICD-10-CM

## 2016-07-12 DIAGNOSIS — C159 Malignant neoplasm of esophagus, unspecified: Secondary | ICD-10-CM

## 2016-07-12 DIAGNOSIS — C7951 Secondary malignant neoplasm of bone: Secondary | ICD-10-CM

## 2016-07-12 MED ORDER — PROCHLORPERAZINE MALEATE 10 MG PO TABS: 10.0000 mg | ORAL_TABLET | Freq: Four times a day (QID) | ORAL | 1 refills | Status: DC | PRN

## 2016-07-12 MED ORDER — ONDANSETRON HCL 8 MG PO TABS: 8.0000 mg | ORAL_TABLET | Freq: Two times a day (BID) | ORAL | 1 refills | Status: DC | PRN

## 2016-07-12 MED ORDER — LOPERAMIDE HCL 2 MG PO TABS: 2.0000 mg | ORAL_TABLET | Freq: Four times a day (QID) | ORAL | 1 refills | Status: DC | PRN

## 2016-07-12 MED ORDER — DEXAMETHASONE 4 MG PO TABS: 8.0000 mg | ORAL_TABLET | Freq: Every day | ORAL | 5 refills | Status: DC

## 2016-07-12 MED FILL — ANTI-DIARRHEAL 2 MG CAPLET: 2 | 6 days supply | Qty: 24 | Fill #0

## 2016-07-13 ENCOUNTER — Ambulatory Visit: Payer: 59

## 2016-07-13 ENCOUNTER — Ambulatory Visit: Payer: 59 | Admitting: Nutrition

## 2016-07-13 ENCOUNTER — Encounter: Payer: Self-pay | Admitting: *Deleted

## 2016-07-13 ENCOUNTER — Other Ambulatory Visit (HOSPITAL_BASED_OUTPATIENT_CLINIC_OR_DEPARTMENT_OTHER): Payer: 59

## 2016-07-13 DIAGNOSIS — C154 Malignant neoplasm of middle third of esophagus: Secondary | ICD-10-CM

## 2016-07-13 DIAGNOSIS — C155 Malignant neoplasm of lower third of esophagus: Secondary | ICD-10-CM

## 2016-07-13 DIAGNOSIS — C7951 Secondary malignant neoplasm of bone: Secondary | ICD-10-CM

## 2016-07-13 DIAGNOSIS — R1310 Dysphagia, unspecified: Secondary | ICD-10-CM

## 2016-07-13 DIAGNOSIS — R1319 Other dysphagia: Secondary | ICD-10-CM

## 2016-07-13 DIAGNOSIS — E440 Moderate protein-calorie malnutrition: Secondary | ICD-10-CM

## 2016-07-13 DIAGNOSIS — C787 Secondary malignant neoplasm of liver and intrahepatic bile duct: Secondary | ICD-10-CM

## 2016-07-13 LAB — COMPREHENSIVE METABOLIC PANEL
ALBUMIN: 2.9 g/dL — AB (ref 3.5–5.0)
ALK PHOS: 237 U/L — AB (ref 40–150)
ALT: 19 U/L (ref 0–55)
AST: 41 U/L — AB (ref 5–34)
Anion Gap: 12 mEq/L — ABNORMAL HIGH (ref 3–11)
BILIRUBIN TOTAL: 0.23 mg/dL (ref 0.20–1.20)
BUN: 10.6 mg/dL (ref 7.0–26.0)
CO2: 28 meq/L (ref 22–29)
CREATININE: 0.6 mg/dL — AB (ref 0.7–1.3)
Calcium: 9.9 mg/dL (ref 8.4–10.4)
Chloride: 94 mEq/L — ABNORMAL LOW (ref 98–109)
GLUCOSE: 106 mg/dL (ref 70–140)
Potassium: 4.2 mEq/L (ref 3.5–5.1)
SODIUM: 134 meq/L — AB (ref 136–145)
TOTAL PROTEIN: 7.4 g/dL (ref 6.4–8.3)

## 2016-07-13 LAB — CBC WITH DIFFERENTIAL/PLATELET
BASO%: 0.2 % (ref 0.0–2.0)
Basophils Absolute: 0 10*3/uL (ref 0.0–0.1)
EOS ABS: 0.1 10*3/uL (ref 0.0–0.5)
EOS%: 1 % (ref 0.0–7.0)
HCT: 27.2 % — ABNORMAL LOW (ref 38.4–49.9)
HEMOGLOBIN: 8.7 g/dL — AB (ref 13.0–17.1)
LYMPH%: 13.9 % — ABNORMAL LOW (ref 14.0–49.0)
MCH: 29.2 pg (ref 27.2–33.4)
MCHC: 32 g/dL (ref 32.0–36.0)
MCV: 91.3 fL (ref 79.3–98.0)
MONO#: 1.4 10*3/uL — ABNORMAL HIGH (ref 0.1–0.9)
MONO%: 11.5 % (ref 0.0–14.0)
NEUT%: 73.4 % (ref 39.0–75.0)
NEUTROS ABS: 9.2 10*3/uL — AB (ref 1.5–6.5)
Platelets: 673 10*3/uL — ABNORMAL HIGH (ref 140–400)
RBC: 2.98 10*6/uL — ABNORMAL LOW (ref 4.20–5.82)
RDW: 16.5 % — AB (ref 11.0–14.6)
WBC: 12.5 10*3/uL — AB (ref 4.0–10.3)
lymph#: 1.7 10*3/uL (ref 0.9–3.3)

## 2016-07-13 LAB — MAGNESIUM: Magnesium: 1.7 mg/dl (ref 1.5–2.5)

## 2016-07-13 NOTE — Progress Notes (Signed)
Pt here for First FOLFIRI and Xgeva treatments.  Pt did not sign consent for chemo due to pt not aware what CI pump is.  Pump shown to pt and mother along with explanations.  However, pt and mother were very anxious about wearing chemo pump home.  Stated he just had feeding tube inserted and still feeling sore from the site.  Stated he would like to postpone chemo for another 2 weeks.   Dr. Irene Limbo notified.   MD saw and discussed situation with pt and mother.

## 2016-07-13 NOTE — Progress Notes (Signed)
Oncology Nurse Navigator Documentation  Oncology Nurse Navigator Flowsheets 07/13/2016  Navigator Location [facility]  Referral date to RadOnc/MedOnc -  Navigator Encounter Type Treatment  Telephone -  Abnormal Finding Date -  Confirmed Diagnosis Date -  Treatment Initiated Date -  Patient Visit Type MedOnc  Treatment Phase Active Tx--due to start FOLFIRI   Barriers/Navigation Needs Education  Education Symptom Management--late diarrhea managment  Interventions Education  Referrals -  Coordination of Care -  Education Method Verbal;Written  Support Groups/Services GI Support Group  Specialty Items/DME -  Acuity -  Acuity Level 2 -  Time Spent with Patient 21  Corin is very anxious about the new treatment and the infusion pump. Dr. Irene Limbo spoke with him and treatment will be delayed a few days. Went to chemo education nurse to learn more about the pump.

## 2016-07-13 NOTE — Progress Notes (Signed)
TF follow up completed with patient diagnosed with esophageal cancer.  Patient reports good tolerance of TF Osmolite 1.5 at goal of 1.5 cans 4 times daily with 120 cc free water before and after bolus feeding. He is drinking water all day long. Denies nutrition side effects. Reports he is having a BM regularly. No recent weight. 117 pounds on November 1.  Estimated Nutrition Needs: 1900-2100 calories, 78-90 grams protein, 2.1 L fluid.  Nutrition Diagnosis:  Unintended weight loss cannot be evaluated.  Intervention: Educated patient to continue TF at goal rate to meet 2130 calories, 89 grams protein, 2166 mL free water.  Monitoring, Evaluation, Goals: Patient will continue tube feeding at goal rate to meet estimated nutrition needs.  Next Visit: To be scheduled as needed.

## 2016-07-14 LAB — PHOSPHORUS: PHOSPHORUS: 2.7 mg/dL (ref 2.5–4.5)

## 2016-07-17 ENCOUNTER — Other Ambulatory Visit: Payer: Self-pay | Admitting: Hematology

## 2016-07-17 ENCOUNTER — Telehealth: Payer: Self-pay | Admitting: *Deleted

## 2016-07-17 ENCOUNTER — Encounter: Payer: Self-pay | Admitting: *Deleted

## 2016-07-17 DIAGNOSIS — C159 Malignant neoplasm of esophagus, unspecified: Secondary | ICD-10-CM

## 2016-07-17 MED ORDER — OXYCODONE HCL 5 MG/5ML PO SOLN
5.0000 mg | ORAL | 0 refills | Status: DC | PRN
Start: 2016-07-17 — End: 2016-07-28

## 2016-07-17 NOTE — Progress Notes (Signed)
Samuel Kitchen    HEMATOLOGY/ONCOLOGY CLINIC NOTE  Date of Service: .06/20/2016    Patient Care Team: Leanna Battles, MD as PCP - General (Internal Medicine)  CHIEF COMPLAINTS/PURPOSE OF CONSULTATION:  Metastatic Esophageal Squamous cell carcinoma   HISTORY OF PRESENTING ILLNESS: plz see my initial consultation regarding patients initial presentation  INTERVAL HISTORY  Samuel Henderson is here for his scheduled follow-up . He notes that his lower back pain is much improved. He is still awaiting his EGD with Dr Hilarie Fredrickson which has been scheduled for 06/26/2016. Feeling quite fatigued. Agreeable to PRBC transfusion for symptomatic anemia.  MEDICAL HISTORY:  Past Medical History:  Diagnosis Date  . Arthritis   . Bone cancer ([place])   . Esophageal cancer ([place])   . History of chemotherapy   . History of radiation therapy   . Hypertension   . Knee pain     SURGICAL HISTORY: Past Surgical History:  Procedure Laterality Date  . ESOPHAGOGASTRODUODENOSCOPY (EGD) WITH ESOPHAGEAL DILATION     and biopsy  . IR GENERIC HISTORICAL  07/05/2016   IR GASTROSTOMY TUBE MOD SED 07/05/2016 [facility]  . IR GENERIC HISTORICAL  07/07/2016   IR RADIOLOGIST EVAL & MGMT 07/07/2016 [name], PA-C [facility]  . IRRIGATION AND DEBRIDEMENT KNEE Right 04/14/2016   Procedure: IRRIGATION AND DEBRIDEMENT RIGHT KNEE, ARTHROTOMY;  Surgeon: Paralee Cancel, MD;  Location: [facility];  Service: Orthopedics;  Laterality: Right;  . KNEE SURGERY    . LESION REMOVAL  1969   hip    SOCIAL HISTORY: Social History   Social History  . Marital status: Single    Spouse name: N/A  . Number of children: 2  . Years of education: N/A   Occupational History  . Manufacturing industrial curtains    Social History Main Topics  . Smoking status: Current Every Day Smoker    Packs/day: 0.50    Years: 20.00    Types: Cigarettes  . Smokeless tobacco: Never Used  . Alcohol use No  . Drug use:     Types: Marijuana     Comment:  last smoked 1 week ago  . Sexual activity: Yes   Other Topics Concern  . 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  FAMILY HISTORY: Family History  Problem Relation Age of Onset  . Esophageal cancer Father   . Cancer Father     nasopharyngeal   . Diabetes Sister   . Cancer Sister     multiple myeloma  . Colon cancer Neg Hx     ALLERGIES:  has No Known Allergies.  MEDICATIONS:  Current Outpatient Prescriptions  Medication Sig Dispense Refill  . acetaminophen (TYLENOL) 500 MG tablet Take 1,000 mg by mouth every 6 (six) hours as needed for moderate pain or headache.    .Samuel KitchenamLODipine (NORVASC) 5 MG tablet Take 5 mg by mouth daily.    .Samuel Kitchenaspirin 81 MG chewable tablet Chew 81 mg by mouth daily.    . Calcium Carbonate-Vitamin D (CALCIUM 600+D PO) Take 1 tablet by mouth daily.    . chlorhexidine (PERIDEX) 0.12 %  solution Use as directed 15 mLs in the mouth or throat 2 (two) times daily. Swish and spit. 120 mL 0  . dexamethasone (DECADRON) 4 MG tablet Take 2 tablets (8 mg total) by mouth daily. Start the day after chemo for 2 days. 8 tablet 5  . diclofenac (VOLTAREN) 75 MG EC tablet Take 75 mg by mouth 2 (two) times daily.    Samuel Kitchen docusate sodium (COLACE) 100 MG capsule Take 100 mg by mouth daily as needed for mild constipation.    . lidocaine-prilocaine (EMLA) cream Apply 1 application topically once. 30 g 1  . lisinopril-hydrochlorothiazide (PRINZIDE,ZESTORETIC) 10-12.5 MG tablet Take 1 tablet by mouth daily.    Samuel Kitchen loperamide (IMODIUM A-D) 2 MG tablet Take 1 tablet (2 mg total) by mouth 4 (four) times daily as needed. Take 2 at diarrhea onset , then 1 every 2hr until 12hrs  with no BM. 100 tablet 1  . LORazepam (ATIVAN) 0.5 MG tablet TAKE 1 TABLET BY MOUTH EVERY 6 HOURS AS NEEDED FOR NAUSEA AND VOMITING 30 tablet 0  . methocarbamol (ROBAXIN) 500 MG tablet Take 500 mg by mouth daily as needed for muscle spasms.     Samuel Kitchen omeprazole (PRILOSEC) 20 MG capsule TAKE ONE CAPSULE BY MOUTH TWICE DAILY BEFORE A MEAL (Patient taking differently: TAKE 44m BY MOUTH TWICE DAILY BEFORE A MEAL when needed for acid reflux/ heartburn) 60 capsule 0  . ondansetron (ZOFRAN) 8 MG tablet Take 1 tablet (8 mg total) by mouth every 8 (eight) hours as needed for nausea or vomiting. 30 tablet 3  . ondansetron (ZOFRAN) 8 MG tablet Take 1 tablet (8 mg total) by mouth 2 (two) times daily as needed for refractory nausea / vomiting. Start on day 3 after chemotherapy. 30 tablet 1  . oxyCODONE (ROXICODONE) 5 MG/5ML solution Take 5-10 mLs (5-10 mg total) by mouth every 4 (four) hours as needed for moderate pain or severe pain. 473 mL 0  . polyethylene glycol (MIRALAX / GLYCOLAX) packet Take 17 g by mouth daily. 30 each 0  . prochlorperazine (COMPAZINE) 10 MG tablet Take 10 mg by mouth daily as needed for nausea.     . prochlorperazine (COMPAZINE) 10 MG tablet Take 1 tablet (10 mg total) by mouth every 6 (six) hours as needed (NAUSEA). 30 tablet 1  . senna-docusate (SENNA S) 8.6-50 MG tablet Take 2 tablets by mouth at bedtime. 60 tablet 1  . VIAGRA 100 MG tablet Take 100 mg by mouth daily as needed for erectile dysfunction. Reported on 10/22/2015     Current Facility-Administered Medications  Medication Dose Route Frequency Provider Last Rate Last Dose  . 0.9 %  sodium chloride infusion  500 mL Intravenous Continuous JJerene Bears MD        REVIEW OF SYSTEMS:    10 Point review of Systems was done is negative except as noted above.  PHYSICAL EXAMINATION: ECOG PERFORMANCE STATUS: 2  . Vitals:   06/20/16 0935  BP: (!) 97/53  Pulse: 92  Resp: 18  Temp: 98.4 F (36.9 C)   Filed Weights    06/20/16 0935  Weight: 120 lb 6.4 oz (54.6 kg)   .Body mass index is 17.78 kg/m.  .Samuel KitchenGENERAL:middle aged AAM ,alert, in no acute distress and comfortable SKIN: skin color, texture, turgor are normal, no rashes or significant lesions EYES: normal, conjunctiva are pink and non-injected, sclera clear OROPHARYNX:no exudate, no erythema and lips, buccal mucosa, and tongue normal  NECK: supple, no JVD, thyroid normal size,  non-tender, without nodularity LYMPH:  no palpable lymphadenopathy in the cervical, axillary or inguinal LUNGS: clear to auscultation with normal respiratory effort HEART: regular rate & rhythm,  no murmurs and no lower extremity edema ABDOMEN: abdomen soft, non-tender, normoactive bowel sounds  Musculoskeletal: no cyanosis of digits and no clubbing  PSYCH: alert & oriented x 3 with fluent speech NEURO: no focal motor/sensory deficits  LABORATORY DATA:  I have reviewed the data as listed  Component     Latest Ref Rng & Units 06/20/2016  WBC     4.0 - 10.3 10e3/uL 6.6  NEUT#     1.5 - 6.5 10e3/uL 5.2  Hemoglobin     13.0 - 17.1 g/dL 8.5 (L)  HCT     38.4 - 49.9 % 25.6 (L)  Platelets     140 - 400 10e3/uL 406 (H)  MCV     79.3 - 98.0 fL 92.8  MCH     27.2 - 33.4 pg 30.8  MCHC     32.0 - 36.0 g/dL 33.2  RBC     4.20 - 5.82 10e6/uL 2.76 (L)  RDW     11.0 - 14.6 % 16.1 (H)  lymph#     0.9 - 3.3 10e3/uL 0.5 (L)  MONO#     0.1 - 0.9 10e3/uL 0.7  Eosinophils Absolute     0.0 - 0.5 10e3/uL 0.2  Basophils Absolute     0.0 - 0.1 10e3/uL 0.0  NEUT%     39.0 - 75.0 % 78.3 (H)  LYMPH%     14.0 - 49.0 % 7.9 (L)  MONO%     0.0 - 14.0 % 10.4  EOS%     0.0 - 7.0 % 3.2  BASO%     0.0 - 2.0 % 0.2  Retic %     0.80 - 1.80 % 1.53  Retic Ct Abs     34.80 - 93.90 10e3/uL 42.23  Immature Retic Fract     3.00 - 10.60 % 11.50 (H)  Sodium     136 - 145 mEq/L 127 (L)  Potassium     3.5 - 5.1 mEq/L 3.9  Chloride     98 - 109 mEq/L 87 (L)  CO2     22 - 29  mEq/L 26  Glucose     70 - 140 mg/dl 103  BUN     7.0 - 26.0 mg/dL 14.9  Creatinine     0.7 - 1.3 mg/dL 1.1  Total Bilirubin     0.20 - 1.20 mg/dL 0.23  Alkaline Phosphatase     40 - 150 U/L 73  AST     5 - 34 U/L 28  ALT     0 - 55 U/L 8  Total Protein     6.4 - 8.3 g/dL 7.3  Albumin     3.5 - 5.0 g/dL 3.2 (L)  Calcium     8.4 - 10.4 mg/dL 9.2  Anion gap     3 - 11 mEq/L 13 (H)  EGFR     >90 ml/min/1.73 m2 >90  Iron     42 - 163 ug/dL 19 (L)  TIBC     202 - 409 ug/dL 218  UIBC     117 - 376 ug/dL 198  %SAT     20 - 55 % 9 (L)  Ferritin     22 - 316 ng/ml 470 (H)   Radiology  CT Chest/abd/pelvis 06/05/2016  IMPRESSION: 1. Significant  interval progression of liver metastases. 2. New abnormal thickening of the esophagus which may reflect local tumor recurrence. 3. Stable lytic lesion involving the T1 vertebra and sclerotic lesion involving the L3 vertebra. 4. Similar appearance of gastrohepatic ligament lymph node.   Electronically Signed   By: Kerby Moors M.D.   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 S/p palliative radiation therapy to the esophagus and regional lymph nodes. He has tolerated treatment generally well except for some mild grade 2 radiation esophagitis which has now resolved. Repeat CT chest abdomen pelvis done 11/17/2015 show progression of his liver metastases and gastrohepatic lymph node as expected.  He has completed his 6 cycles of carboplatin and Taxol  With grade 1-2 fatigue and grade 1 neuropathy CT c/a/p with stable improved disease with some  T1 metastasis, as before. Additional lesions in the L3 vertebral body and right sacrum, corresponding to abnormalities on 04/06/2016 and worrisome for metastatic  disease.  CT CAP 06/05/2016 -- significant progression of liver mets and concern for new abnormal thickening of the esophagus -likely reflecting local tumor recurrence. 2) HTN- patient notes his appetite has improved and he is eating better. On amlodipine. 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 4) grade 2 radiation esophagitis. Resolved . 5) leukocytosis likely due to Neulasta. No evidence of infection at this time. 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 This has improved some and remains nonpainful. 8) neoplasm related pain especially from bone metastases in his lower back -improving Plan  -Continue your Xgeva shots every 4 weeks -Gastroenterology referral to Dr. Hilarie Fredrickson at [facility] - for increasing dysphagia status post chemotherapy radiation for esophageal cancer. To consider dilatation/tumor ablation/esophageal stenting. Has appointment on 06/26/2016 -transfuse PRBC for symptomatic anemia especially since he needs to get EGD -RTC with Dr Irene Limbo for FOLFIRI chemotherapy on 06/30/2016 with labs  All of his questions and concerns were answered in detail.  I spent 20 minutes counseling the patient face to face. The total time spent in the appointment was 20 minutes and more than 50% was on counseling and direct patient cares.    Sullivan Lone MD Dora AAHIVMS [facility] Hematology/Oncology Physician [facility]  (Office):       [phone] (Work cell):  [phone] (Fax):           [phone]

## 2016-07-17 NOTE — Progress Notes (Signed)
Samuel Henderson    HEMATOLOGY/ONCOLOGY CLINIC NOTE  Date of Service: .07/04/2016    Patient Care Team: Leanna Battles, MD as PCP - General (Internal Medicine)  CHIEF COMPLAINTS/PURPOSE OF CONSULTATION:  Metastatic Esophageal Squamous cell carcinoma   HISTORY OF PRESENTING ILLNESS: plz see my initial consultation regarding patients initial presentation  INTERVAL HISTORY  Samuel Henderson is here for his scheduled follow-up . He notes that his lower back painis currently not a bother. Still having dysphagia. EGD was done and showed local recurrence of SCC of the esophagus. Stenting not possible. He was recommended and has been setup for a feeding tube by IR (scheduled for 07/07/2016). He wants to delay the chemotherapy for a week or so has FT placed to allow for some nutrition.   MEDICAL HISTORY:  Past Medical History:  Diagnosis Date  . Arthritis   . Bone cancer ([place])   . Esophageal cancer ([place])   . History of chemotherapy   . History of radiation therapy   . Hypertension   . Knee pain     SURGICAL HISTORY: Past Surgical History:  Procedure Laterality Date  . ESOPHAGOGASTRODUODENOSCOPY (EGD) WITH ESOPHAGEAL DILATION     and biopsy  . IR GENERIC HISTORICAL  07/05/2016   IR GASTROSTOMY TUBE MOD SED 07/05/2016 [facility]  . IR GENERIC HISTORICAL  07/07/2016   IR RADIOLOGIST EVAL & MGMT 07/07/2016 [name], PA-C [facility]  . IRRIGATION AND DEBRIDEMENT KNEE Right 04/14/2016   Procedure: IRRIGATION AND DEBRIDEMENT RIGHT KNEE, ARTHROTOMY;  Surgeon: Paralee Cancel, MD;  Location: [facility];  Service: Orthopedics;  Laterality: Right;  . KNEE SURGERY    . LESION REMOVAL  1969   hip    SOCIAL HISTORY: Social History   Social History  . Marital status: Single    Spouse name: N/A  . Number of children: 2  . Years of education: N/A   Occupational History  . Manufacturing industrial curtains    Social History Main Topics  . Smoking status: Current Every Day Smoker    Packs/day:  0.50    Years: 20.00    Types: Cigarettes  . Smokeless tobacco: Never Used  . Alcohol use No  . Drug use:     Types: Marijuana     Comment: last smoked 1 week ago  . Sexual activity: Yes   Other Topics Concern  . 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 Sober for 15 yrs  Ex smoker 1/2 PPD x 78yr quit about 18 yrs ago.  FAMILY HISTORY: Family History  Problem Relation Age of Onset  . Esophageal cancer Father   . Cancer Father     nasopharyngeal   . Diabetes Sister   . Cancer Sister     multiple myeloma  . Colon cancer Neg Hx     ALLERGIES:  has No Known Allergies.  MEDICATIONS:  Current Outpatient Prescriptions  Medication Sig Dispense Refill  . acetaminophen (TYLENOL) 500 MG tablet Take 1,000 mg by mouth every 6 (six) hours as needed for moderate pain or headache.    .Samuel KitchenamLODipine (NORVASC) 5 MG tablet Take 5 mg by mouth daily.    .Samuel Kitchenaspirin 81 MG chewable tablet  Chew 81 mg by mouth daily.    . Calcium Carbonate-Vitamin D (CALCIUM 600+D PO) Take 1 tablet by mouth daily.    . chlorhexidine (PERIDEX) 0.12 % solution Use as directed 15 mLs in the mouth or throat 2 (two) times daily. Swish and spit. 120 mL 0  . dexamethasone (DECADRON) 4 MG tablet Take 2 tablets (8 mg total) by mouth daily. Start the day after chemo for 2 days. 8 tablet 5  . diclofenac (VOLTAREN) 75 MG EC tablet Take 75 mg by mouth 2 (two) times daily.    Samuel Henderson docusate sodium (COLACE) 100 MG capsule Take 100 mg by mouth daily as needed for mild constipation.    . lidocaine-prilocaine (EMLA) cream Apply 1 application topically once. 30 g 1  . lisinopril-hydrochlorothiazide (PRINZIDE,ZESTORETIC) 10-12.5 MG tablet Take 1 tablet by mouth daily.    Samuel Henderson loperamide  (IMODIUM A-D) 2 MG tablet Take 1 tablet (2 mg total) by mouth 4 (four) times daily as needed. Take 2 at diarrhea onset , then 1 every 2hr until 12hrs with no BM. 100 tablet 1  . LORazepam (ATIVAN) 0.5 MG tablet TAKE 1 TABLET BY MOUTH EVERY 6 HOURS AS NEEDED FOR NAUSEA AND VOMITING 30 tablet 0  . methocarbamol (ROBAXIN) 500 MG tablet Take 500 mg by mouth daily as needed for muscle spasms.     Samuel Henderson omeprazole (PRILOSEC) 20 MG capsule TAKE ONE CAPSULE BY MOUTH TWICE DAILY BEFORE A MEAL (Patient taking differently: TAKE 7m BY MOUTH TWICE DAILY BEFORE A MEAL when needed for acid reflux/ heartburn) 60 capsule 0  . ondansetron (ZOFRAN) 8 MG tablet Take 1 tablet (8 mg total) by mouth every 8 (eight) hours as needed for nausea or vomiting. 30 tablet 3  . ondansetron (ZOFRAN) 8 MG tablet Take 1 tablet (8 mg total) by mouth 2 (two) times daily as needed for refractory nausea / vomiting. Start on day 3 after chemotherapy. 30 tablet 1  . oxyCODONE (ROXICODONE) 5 MG/5ML solution Take 5-10 mLs (5-10 mg total) by mouth every 4 (four) hours as needed for moderate pain or severe pain. 473 mL 0  . polyethylene glycol (MIRALAX / GLYCOLAX) packet Take 17 g by mouth daily. 30 each 0  . prochlorperazine (COMPAZINE) 10 MG tablet Take 10 mg by mouth daily as needed for nausea.     . prochlorperazine (COMPAZINE) 10 MG tablet Take 1 tablet (10 mg total) by mouth every 6 (six) hours as needed (NAUSEA). 30 tablet 1  . senna-docusate (SENNA S) 8.6-50 MG tablet Take 2 tablets by mouth at bedtime. 60 tablet 1  . VIAGRA 100 MG tablet Take 100 mg by mouth daily as needed for erectile dysfunction. Reported on 10/22/2015     Current Facility-Administered Medications  Medication Dose Route Frequency Provider Last Rate Last Dose  . 0.9 %  sodium chloride infusion  500 mL Intravenous Continuous JJerene Bears MD        REVIEW OF SYSTEMS:    10 Point review of Systems was done is negative except as noted above.  PHYSICAL  EXAMINATION: ECOG PERFORMANCE STATUS: 2  . Vitals:   07/04/16 1053  BP: (!) 152/79  Pulse: (!) 107  Resp: 18  Temp: 98.6 F (37 C)   Filed Weights   07/04/16 1053  Weight: 117 lb 3.2 oz (53.2 kg)   .Body mass index is 17.31 kg/m.  .Samuel KitchenGENERAL:middle aged AAM ,alert, in no acute distress and comfortable SKIN: skin color, texture, turgor are normal, no rashes  or significant lesions EYES: normal, conjunctiva are pink and non-injected, sclera clear OROPHARYNX:no exudate, no erythema and lips, buccal mucosa, and tongue normal  NECK: supple, no JVD, thyroid normal size, non-tender, without nodularity LYMPH:  no palpable lymphadenopathy in the cervical, axillary or inguinal LUNGS: clear to auscultation with normal respiratory effort HEART: regular rate & rhythm,  no murmurs and no lower extremity edema ABDOMEN: abdomen soft, non-tender, normoactive bowel sounds  Musculoskeletal: no cyanosis of digits and no clubbing  PSYCH: alert & oriented x 3 with fluent speech NEURO: no focal motor/sensory deficits  LABORATORY DATA:  I have reviewed the data as listed  Component     Latest Ref Rng & Units 07/04/2016  WBC     4.0 - 10.3 10e3/uL 9.6  NEUT#     1.5 - 6.5 10e3/uL 6.8 (H)  Hemoglobin     13.0 - 17.1 g/dL 9.4 (L)  HCT     38.4 - 49.9 % 28.4 (L)  Platelets     140 - 400 10e3/uL 466 (H)  MCV     79.3 - 98.0 fL 91.0  MCH     27.2 - 33.4 pg 30.1  MCHC     32.0 - 36.0 g/dL 33.1  RBC     4.20 - 5.82 10e6/uL 3.12 (L)  RDW     11.0 - 14.6 % 15.5 (H)  lymph#     0.9 - 3.3 10e3/uL 1.2  MONO#     0.1 - 0.9 10e3/uL 1.5 (H)  Eosinophils Absolute     0.0 - 0.5 10e3/uL 0.1  Basophils Absolute     0.0 - 0.1 10e3/uL 0.0  NEUT%     39.0 - 75.0 % 70.9  LYMPH%     14.0 - 49.0 % 12.5 (L)  MONO%     0.0 - 14.0 % 15.8 (H)  EOS%     0.0 - 7.0 % 0.5  BASO%     0.0 - 2.0 % 0.3  Retic %     0.80 - 1.80 % 1.36  Retic Ct Abs     34.80 - 93.90 10e3/uL 42.43  Immature Retic  Fract     3.00 - 10.60 % 16.10 (H)  Sodium     136 - 145 mEq/L 133 (L)  Potassium     3.5 - 5.1 mEq/L 4.4  Chloride     98 - 109 mEq/L 93 (L)  CO2     22 - 29 mEq/L 27  Glucose     70 - 140 mg/dl 115  BUN     7.0 - 26.0 mg/dL 6.4 (L)  Creatinine     0.7 - 1.3 mg/dL 0.7  Total Bilirubin     0.20 - 1.20 mg/dL 0.36  Alkaline Phosphatase     40 - 150 U/L 124  AST     5 - 34 U/L 33  ALT     0 - 55 U/L 13  Total Protein     6.4 - 8.3 g/dL 7.7  Albumin     3.5 - 5.0 g/dL 3.0 (L)  Calcium     8.4 - 10.4 mg/dL 9.5  Anion gap     3 - 11 mEq/L 13 (H)  EGFR     >90 ml/min/1.73 m2 >90        Radiology  CT Chest/abd/pelvis 06/05/2016  IMPRESSION: 1. Significant interval progression of liver metastases. 2. New abnormal thickening of the esophagus which may reflect local tumor recurrence. 3. Stable  lytic lesion involving the T1 vertebra and sclerotic lesion involving the L3 vertebra. 4. Similar appearance of gastrohepatic ligament lymph node.   Electronically Signed   By: Kerby Moors M.D.   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 S/p palliative radiation therapy to the esophagus and regional lymph nodes. He has tolerated treatment generally well except for some mild grade 2 radiation esophagitis which has now resolved. Repeat CT chest abdomen pelvis done 11/17/2015 show progression of his liver metastases and gastrohepatic lymph node as expected.  He has completed his 6 cycles of carboplatin and Taxol  With grade 1-2 fatigue and grade 1 neuropathy CT c/a/p with stable improved disease with some  T1 metastasis, as before. Additional lesions in the L3 vertebral body and right sacrum, corresponding to abnormalities on 04/06/2016 and  worrisome for metastatic disease.  CT CAP 06/05/2016 -- significant progression of liver mets and concern for new abnormal thickening of the esophagus -likely reflecting local tumor recurrence. 2) HTN- patient notes his appetite has improved and he is eating better. On amlodipine. 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 EGD done 06/26/2016 confirms local recurrence of SCC with PDL1 expression 0% 4) grade 2 radiation esophagitis. Resolved . 5) leukocytosis likely due to Neulasta. No evidence of infection at this time. 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 This has improved some and remains nonpainful. 8) neoplasm related pain especially from bone metastases in his lower back - controlled 9) Liver mets 10)Bone mets Plan  -Schedule to start FOLFIRI on Thursday 07/13/2016 with labs -IR feeding tube tomorrow (scheduled) -Ernestene Kiel for tube feeding consultation ASAP -Continue Marchelle Folks -RTC with Dr Irene Limbo 1 week after starting FOLFIRI for toxicity check -transfuse PRBC for symptomatic anemia as needed.   All of his questions and concerns were answered in detail.  I spent 20 minutes counseling the patient face to face. The total time spent in the appointment was 25 minutes and more than 50% was on counseling and direct patient cares.    Sullivan Lone MD [place] AAHIVMS [facility] Hematology/Oncology Physician [facility]  (Office):       [phone] (Work cell):  [phone] (Fax):           [phone]

## 2016-07-17 NOTE — Progress Notes (Signed)
.    HEMATOLOGY/ONCOLOGY CLINIC NOTE  Date of Service: .06/06/2016    Patient Care Team: Daniel Paterson, MD as PCP - General (Internal Medicine)  CHIEF COMPLAINTS/PURPOSE OF CONSULTATION:  Metastatic Esophageal Squamous cell carcinoma   HISTORY OF PRESENTING ILLNESS: plz see my initial consultation regarding patients initial presentation  INTERVAL HISTORY  Samuel Henderson is here for his scheduled follow-up . He notes that he is currently undergoing RT to bone mets in his lower back to manage his pain He notes some decrease in lower back pain. Notes increasing esophgeal dysphagia with issues with food getting stuck.  MEDICAL HISTORY:  Past Medical History:  Diagnosis Date  . Arthritis   . Bone cancer ([facility])   . Esophageal cancer ([facility])   . History of chemotherapy   . History of radiation therapy   . Hypertension   . Knee pain     SURGICAL HISTORY: Past Surgical History:  Procedure Laterality Date  . ESOPHAGOGASTRODUODENOSCOPY (EGD) WITH ESOPHAGEAL DILATION     and biopsy  . IR GENERIC HISTORICAL  07/05/2016   IR GASTROSTOMY TUBE MOD SED 07/05/2016 [facility]  . IR GENERIC HISTORICAL  07/07/2016   IR RADIOLOGIST EVAL & MGMT 07/07/2016 Wendy Sanders Blair, PA-C [facility]  . IRRIGATION AND DEBRIDEMENT KNEE Right 04/14/2016   Procedure: IRRIGATION AND DEBRIDEMENT RIGHT KNEE, ARTHROTOMY;  Surgeon: Matthew Olin, MD;  Location: [facility];  Service: Orthopedics;  Laterality: Right;  . KNEE SURGERY    . LESION REMOVAL  1969   hip    SOCIAL HISTORY: Social History   Social History  . Marital status: Single    Spouse name: N/A  . Number of children: 2  . Years of education: N/A   Occupational History  . Manufacturing industrial curtains    Social History Main Topics  . Smoking status: Current Every Day Smoker    Packs/day: 0.50    Years: 20.00    Types: Cigarettes  . Smokeless tobacco: Never Used  . Alcohol use No  . Drug use:     Types: Marijuana     Comment: last  smoked 1 week ago  . Sexual activity: Yes   Other Topics Concern  . 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  FAMILY HISTORY: Family History  Problem Relation Age of Onset  . Esophageal cancer Father   . Cancer Father     nasopharyngeal   . Diabetes Sister   . Cancer Sister     multiple myeloma  . Colon cancer Neg Hx     ALLERGIES:  has No Known Allergies.  MEDICATIONS:  Current Outpatient Prescriptions  Medication Sig Dispense Refill  . acetaminophen (TYLENOL) 500 MG tablet Take 1,000 mg by mouth every 6 (six) hours as needed for moderate pain or headache.    . amLODipine (NORVASC) 5 MG tablet Take 5 mg by mouth daily.    . aspirin 81 MG chewable tablet Chew 81 mg by mouth daily.    . Calcium Carbonate-Vitamin D (CALCIUM 600+D PO) Take 1 tablet by mouth daily.    . chlorhexidine (PERIDEX)   0.12 % solution Use as directed 15 mLs in the mouth or throat 2 (two) times daily. Swish and spit. 120 mL 0  . dexamethasone (DECADRON) 4 MG tablet Take 2 tablets (8 mg total) by mouth daily. Start the day after chemo for 2 days. 8 tablet 5  . diclofenac (VOLTAREN) 75 MG EC tablet Take 75 mg by mouth 2 (two) times daily.    . docusate sodium (COLACE) 100 MG capsule Take 100 mg by mouth daily as needed for mild constipation.    . lidocaine-prilocaine (EMLA) cream Apply 1 application topically once. 30 g 1  . lisinopril-hydrochlorothiazide (PRINZIDE,ZESTORETIC) 10-12.5 MG tablet Take 1 tablet by mouth daily.    . loperamide (IMODIUM A-D) 2 MG tablet Take 1 tablet (2 mg total) by mouth 4 (four) times daily as needed. Take 2 at diarrhea onset , then 1 every 2hr until 12hrs with  no BM. 100 tablet 1  . LORazepam (ATIVAN) 0.5 MG tablet TAKE 1 TABLET BY MOUTH EVERY 6 HOURS AS NEEDED FOR NAUSEA AND VOMITING 30 tablet 0  . methocarbamol (ROBAXIN) 500 MG tablet Take 500 mg by mouth daily as needed for muscle spasms.     . omeprazole (PRILOSEC) 20 MG capsule TAKE ONE CAPSULE BY MOUTH TWICE DAILY BEFORE A MEAL (Patient taking differently: TAKE 20mg BY MOUTH TWICE DAILY BEFORE A MEAL when needed for acid reflux/ heartburn) 60 capsule 0  . ondansetron (ZOFRAN) 8 MG tablet Take 1 tablet (8 mg total) by mouth every 8 (eight) hours as needed for nausea or vomiting. 30 tablet 3  . ondansetron (ZOFRAN) 8 MG tablet Take 1 tablet (8 mg total) by mouth 2 (two) times daily as needed for refractory nausea / vomiting. Start on day 3 after chemotherapy. 30 tablet 1  . oxyCODONE (ROXICODONE) 5 MG/5ML solution Take 5-10 mLs (5-10 mg total) by mouth every 4 (four) hours as needed for moderate pain or severe pain. 473 mL 0  . polyethylene glycol (MIRALAX / GLYCOLAX) packet Take 17 g by mouth daily. 30 each 0  . prochlorperazine (COMPAZINE) 10 MG tablet Take 10 mg by mouth daily as needed for nausea.     . prochlorperazine (COMPAZINE) 10 MG tablet Take 1 tablet (10 mg total) by mouth every 6 (six) hours as needed (NAUSEA). 30 tablet 1  . senna-docusate (SENNA S) 8.6-50 MG tablet Take 2 tablets by mouth at bedtime. 60 tablet 1  . VIAGRA 100 MG tablet Take 100 mg by mouth daily as needed for erectile dysfunction. Reported on 10/22/2015     Current Facility-Administered Medications  Medication Dose Route Frequency Provider Last Rate Last Dose  . 0.9 %  sodium chloride infusion  500 mL Intravenous Continuous Jay M Pyrtle, MD        REVIEW OF SYSTEMS:    10 Point review of Systems was done is negative except as noted above.  PHYSICAL EXAMINATION: ECOG PERFORMANCE STATUS: 1 - Symptomatic but completely ambulatory  . Vitals:   06/06/16 1025  BP: 125/62  Pulse: (!) 105  Resp: 20  Temp: 98.3 F  (36.8 C)   Filed Weights   06/06/16 1025  Weight: 125 lb 6.4 oz (56.9 kg)   .Body mass index is 18.52 kg/m.  . GENERAL:middle aged AAM ,alert, in no acute distress and comfortable SKIN: skin color, texture, turgor are normal, no rashes or significant lesions EYES: normal, conjunctiva are pink and non-injected, sclera clear OROPHARYNX:no exudate, no erythema and lips, buccal mucosa, and tongue normal    NECK: supple, no JVD, thyroid normal size, non-tender, without nodularity LYMPH:  no palpable lymphadenopathy in the cervical, axillary or inguinal LUNGS: clear to auscultation with normal respiratory effort HEART: regular rate & rhythm,  no murmurs and no lower extremity edema ABDOMEN: abdomen soft, non-tender, normoactive bowel sounds  Musculoskeletal: no cyanosis of digits and no clubbing  PSYCH: alert & oriented x 3 with fluent speech NEURO: no focal motor/sensory deficits  LABORATORY DATA:  I have reviewed the data as listed  Component     Latest Ref Rng & Units 06/06/2016  WBC     4.0 - 10.3 10e3/uL 6.9  NEUT#     1.5 - 6.5 10e3/uL 5.4  Hemoglobin     13.0 - 17.1 g/dL 8.6 (L)  HCT     38.4 - 49.9 % 25.9 (L)  Platelets     140 - 400 10e3/uL 452 (H)  MCV     79.3 - 98.0 fL 95.9  MCH     27.2 - 33.4 pg 31.9  MCHC     32.0 - 36.0 g/dL 33.2  RBC     4.20 - 5.82 10e6/uL 2.70 (L)  RDW     11.0 - 14.6 % 15.6 (H)  lymph#     0.9 - 3.3 10e3/uL 0.7 (L)  MONO#     0.1 - 0.9 10e3/uL 0.7  Eosinophils Absolute     0.0 - 0.5 10e3/uL 0.1  Basophils Absolute     0.0 - 0.1 10e3/uL 0.0  NEUT%     39.0 - 75.0 % 78.1 (H)  LYMPH%     14.0 - 49.0 % 10.0 (L)  MONO%     0.0 - 14.0 % 10.8  EOS%     0.0 - 7.0 % 1.0  BASO%     0.0 - 2.0 % 0.1  Retic %     0.80 - 1.80 % 0.97  Retic Ct Abs     34.80 - 93.90 10e3/uL 26.19 (L)  Immature Retic Fract     3.00 - 10.60 % 11.00 (H)  Sodium     136 - 145 mEq/L 134 (L)  Potassium     3.5 - 5.1 mEq/L 4.1  Chloride     98 - 109  mEq/L 94 (L)  CO2     22 - 29 mEq/L 26  Glucose     70 - 140 mg/dl 123  BUN     7.0 - 26.0 mg/dL 10.5  Creatinine     0.7 - 1.3 mg/dL 1.0  Total Bilirubin     0.20 - 1.20 mg/dL 0.35  Alkaline Phosphatase     40 - 150 U/L 78  AST     5 - 34 U/L 20  ALT     0 - 55 U/L 11  Total Protein     6.4 - 8.3 g/dL 8.0  Albumin     3.5 - 5.0 g/dL 3.4 (L)  Calcium     8.4 - 10.4 mg/dL 9.8  Anion gap     3 - 11 mEq/L 13 (H)  EGFR     >90 ml/min/1.73 m2 >90  Phosphorus     2.5 - 4.5 mg/dL     Radiology  CT Chest/abd/pelvis 06/05/2016  IMPRESSION: 1. Significant interval progression of liver metastases. 2. New abnormal thickening of the esophagus which may reflect local tumor recurrence. 3. Stable lytic lesion involving the T1 vertebra and sclerotic lesion involving the L3 vertebra. 4. Similar appearance of gastrohepatic  ligament lymph node.   Electronically Signed   By: Kerby Moors M.D.   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 S/p palliative radiation therapy to the esophagus and regional lymph nodes. He has tolerated treatment generally well except for some mild grade 2 radiation esophagitis which has now resolved. Repeat CT chest abdomen pelvis done 11/17/2015 show progression of his liver metastases and gastrohepatic lymph node as expected.  He has completed his 6 cycles of carboplatin and Taxol  With grade 1-2 fatigue and grade 1 neuropathy CT c/a/p with stable improved disease with some  T1 metastasis, as before. Additional lesions in the L3 vertebral body and right sacrum, corresponding to abnormalities on 04/06/2016 and worrisome for metastatic disease.  CT CAP 06/05/2016 -- significant progression of liver mets and concern for new  abnormal thickening of the esophagus -likely reflecting local tumor recurrence. 2) HTN- patient notes his appetite has improved and he is eating better. On amlodipine. 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 4) grade 2 radiation esophagitis. Resolved . 5) leukocytosis likely due to Neulasta. No evidence of infection at this time. 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 This has improved some and remains nonpainful. 8) neoplasm related pain especially from bone metastases in his lower back -improving Plan  -Continue your Xgeva shots every 4 weeks -Gastroenterology referral to Dr. Hilarie Fredrickson at [place] - for increasing dysphagia status post chemotherapy radiation for esophageal cancer. To consider dilatation/tumor ablation/esophageal stenting. -Return to clinic with Dr. Irene Limbo in 2 weeks with labs -Set up for chemotherapy 5-FU/irinotecan in 2 weeks. -Continue follow-up with radiation therapy as per schedule -Continue when necessary oxycodone for pain.  All of his questions and concerns were answered in detail.   return to care with Dr. Irene Limbo in 2  weeks with repeat labs and chemotherapy  I spent 25 minutes counseling the patient face to face. The total time spent in the appointment was 30 minutes and more than 50% was on counseling and direct patient cares.    Sullivan Lone MD [place] AAHIVMS [facility] Hematology/Oncology Physician [facility]  (Office):       [phone] (Work cell):  [phone] (Fax):           [phone]

## 2016-07-17 NOTE — Telephone Encounter (Signed)
Call received from patient requesting refill on Oxycodone for pick up tomorrow.  Message left with Darden Dates at Dr. Grier Mitts desk for need for refill and patient's request for call when script is ready for pick up.

## 2016-07-17 NOTE — Progress Notes (Signed)
Informed patient of prescription (ready for pick up). Patient verbalized understanding.

## 2016-07-18 MED FILL — oxyCODONE HCL 5 MG/5ML SOLN: 5 | 8 days supply | Qty: 473 | Fill #0

## 2016-07-20 ENCOUNTER — Ambulatory Visit (HOSPITAL_BASED_OUTPATIENT_CLINIC_OR_DEPARTMENT_OTHER): Payer: 59 | Admitting: Hematology

## 2016-07-20 ENCOUNTER — Encounter: Payer: Self-pay | Admitting: Hematology

## 2016-07-20 ENCOUNTER — Ambulatory Visit (HOSPITAL_COMMUNITY)
Admission: RE | Admit: 2016-07-20 | Discharge: 2016-07-20 | Disposition: A | Discharge status: Home / Self Care | Payer: 59 | Source: Ambulatory Visit | Attending: Interventional Radiology | Admitting: Interventional Radiology

## 2016-07-20 ENCOUNTER — Telehealth: Payer: Self-pay | Admitting: Hematology

## 2016-07-20 VITALS — BP 136/80 | HR 96 | Temp 98.3°F | Resp 18 | Wt 117.1 lb

## 2016-07-20 DIAGNOSIS — G893 Neoplasm related pain (acute) (chronic): Secondary | ICD-10-CM | POA: Diagnosis not present

## 2016-07-20 DIAGNOSIS — D72829 Elevated white blood cell count, unspecified: Secondary | ICD-10-CM

## 2016-07-20 DIAGNOSIS — Z720 Tobacco use: Secondary | ICD-10-CM | POA: Diagnosis not present

## 2016-07-20 DIAGNOSIS — R1319 Other dysphagia: Secondary | ICD-10-CM

## 2016-07-20 DIAGNOSIS — I10 Essential (primary) hypertension: Secondary | ICD-10-CM | POA: Diagnosis not present

## 2016-07-20 DIAGNOSIS — C787 Secondary malignant neoplasm of liver and intrahepatic bile duct: Secondary | ICD-10-CM | POA: Diagnosis not present

## 2016-07-20 DIAGNOSIS — R1310 Dysphagia, unspecified: Secondary | ICD-10-CM

## 2016-07-20 DIAGNOSIS — G629 Polyneuropathy, unspecified: Secondary | ICD-10-CM | POA: Diagnosis not present

## 2016-07-20 DIAGNOSIS — C155 Malignant neoplasm of lower third of esophagus: Secondary | ICD-10-CM

## 2016-07-20 DIAGNOSIS — D649 Anemia, unspecified: Secondary | ICD-10-CM

## 2016-07-20 DIAGNOSIS — C159 Malignant neoplasm of esophagus, unspecified: Secondary | ICD-10-CM

## 2016-07-20 DIAGNOSIS — C7951 Secondary malignant neoplasm of bone: Secondary | ICD-10-CM

## 2016-07-20 DIAGNOSIS — R5383 Other fatigue: Secondary | ICD-10-CM

## 2016-07-20 HISTORY — PX: IR GENERIC HISTORICAL: IMG1180011

## 2016-07-20 MED ORDER — FENTANYL 12 MCG/HR TD PT72: 12.0000 ug | MEDICATED_PATCH | TRANSDERMAL | 0 refills | Status: DC

## 2016-07-20 MED FILL — fentaNYL 12 MCG/HR PT72: 12 | 30 days supply | Qty: 10 | Fill #0

## 2016-07-20 NOTE — Telephone Encounter (Signed)
Gave patient avs report and appointments for November and December. Patient tx scheduled for 11/24 (Friday) and has pump. tx will need to be moved to a day other Friday patient does not want to come at all the week of Thanksgiving holiday.   Message to desk nurse re above change in appointments. Patient scheduled for lab/fu 12/4 per 11/16 los to return for lab f/u 7-10 days after tx.

## 2016-07-20 NOTE — Procedures (Signed)
Pt returns for removal of T-fasteners. S/p Perc G-tube on 11/1 Has been doing well.  3 T-fasteners removed without difficulty. Bumper is snug, no leak.  Follow up prn.  Ascencion Dike PA-C Interventional Radiology 07/20/2016 12:54 PM

## 2016-07-21 NOTE — Progress Notes (Addendum)
Mr. 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  Skin changes:Peg tube site without signs of infection pink healthy tissue. Weight: Wt Readings from Last 3 Encounters:  07/25/16 114 lb 6.4 oz (51.9 kg)  07/20/16 117 lb 1 oz (53.1 kg)  07/05/16 117 lb (53.1 kg)  Patient reports good tolerance of TF Osmolite 1.5 at goal of 1.5 cans 4 times daily with 120 cc free water before and after bolus feeding. He is drinking water all day long, weight loss of 3 pounds Swallowing issues/pain:Difficulty swallowing food Pain:7/10 to both arms taking Oxycodone Nausea/vomiting:None Bowel/Bladder concerns:None  Fatigue:Having fatigue most of the day; gets up and down to chair during the day. Getting ready to re-start chemotherapy 09-01-16 BP (!) 149/88   Pulse (!) 102   Temp 98.7 F (37.1 C) (Oral)   Resp 16   Ht 5\' 9"  (1.753 m)   Wt 114 lb 6.4 oz (51.9 kg)   SpO2 100%   BMI 16.89 kg/m

## 2016-07-24 MED FILL — DEXAMETHASONE 4 MG TABLET: 4 | 4 days supply | Qty: 8 | Fill #0

## 2016-07-25 ENCOUNTER — Encounter: Payer: Self-pay | Admitting: Radiation Oncology

## 2016-07-25 ENCOUNTER — Ambulatory Visit
Admission: RE | Admit: 2016-07-25 | Discharge: 2016-07-25 | Disposition: A | Discharge status: Home / Self Care | Payer: 59 | Source: Ambulatory Visit | Attending: Radiation Oncology | Admitting: Radiation Oncology

## 2016-07-25 DIAGNOSIS — Z79899 Other long term (current) drug therapy: Secondary | ICD-10-CM | POA: Insufficient documentation

## 2016-07-25 DIAGNOSIS — Z923 Personal history of irradiation: Secondary | ICD-10-CM | POA: Insufficient documentation

## 2016-07-25 DIAGNOSIS — C159 Malignant neoplasm of esophagus, unspecified: Secondary | ICD-10-CM | POA: Insufficient documentation

## 2016-07-25 DIAGNOSIS — C7951 Secondary malignant neoplasm of bone: Secondary | ICD-10-CM

## 2016-07-25 DIAGNOSIS — C155 Malignant neoplasm of lower third of esophagus: Secondary | ICD-10-CM

## 2016-07-25 DIAGNOSIS — Z7982 Long term (current) use of aspirin: Secondary | ICD-10-CM | POA: Insufficient documentation

## 2016-07-25 DIAGNOSIS — Z9221 Personal history of antineoplastic chemotherapy: Secondary | ICD-10-CM | POA: Insufficient documentation

## 2016-07-25 DIAGNOSIS — E46 Unspecified protein-calorie malnutrition: Secondary | ICD-10-CM | POA: Diagnosis not present

## 2016-07-25 DIAGNOSIS — Z931 Gastrostomy status: Secondary | ICD-10-CM | POA: Diagnosis not present

## 2016-07-25 NOTE — Progress Notes (Signed)
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  Interval Since Last Radiation:  6 weeks   05/29/16-06/09/16: L2-SI Joints was treated to 30 Gy in 10 fractions  10/12/2015-10/22/2015: The distal esophagus was treated to 30 Gy in 10 fractions of 3 Gy  Narrative:  The patient returns today for routine follow-up.  He tolerated radiotherapy well without significant difficulty. Since his last visit, he continues on Folfiri with Dr. Irene Limbo. He has also had a PEG tube placed for tube feeds due to progressive disease in the esophagus. He also has liver involvement, and stable T1 disease that has not caused pain to require radiotherapy.                    On review of systems, the patient states he is doing ok with his eating. He continues to drink water throughout the day but reports it is tough to try and get 4 cans of osmolite in due to early satiety. He denies emesis, and reports he has lost a couple of pounds. He has been having neuropathic pain in his hands bilaterally and this is felt to be due to his chemotherapy regimen. He reports that his lumbar spine pain is nearly resolved and he is no longer taking any pain medication for this. No other complaints are verbalized.  ALLERGIES:  has No Known Allergies.  Meds: Current Outpatient Prescriptions  Medication Sig Dispense Refill  . docusate sodium (COLACE) 100 MG capsule Take 100 mg by mouth daily as needed for mild constipation.    [name] Kitchen omeprazole (PRILOSEC) 20 MG capsule TAKE ONE CAPSULE BY MOUTH TWICE DAILY BEFORE A MEAL (Patient taking differently: TAKE 20mg  BY MOUTH TWICE DAILY BEFORE A MEAL when needed for acid reflux/ heartburn) 60 capsule 0  . oxyCODONE (ROXICODONE)  5 MG/5ML solution Take 5-10 mLs (5-10 mg total) by mouth every 4 (four) hours as needed for moderate pain or severe pain. 473 mL 0  . aspirin 81 MG chewable tablet Chew 81 mg by mouth daily.    . Calcium Carbonate-Vitamin D (CALCIUM 600+D PO) Take 1 tablet by mouth daily.    . chlorhexidine (PERIDEX) 0.12 % solution Use as directed 15 mLs in the mouth or throat 2 (two) times daily. Swish and spit. (Patient not taking: Reported on 07/25/2016) 120 mL 0  . dexamethasone (DECADRON) 4 MG tablet Take 2 tablets (8 mg total) by mouth daily. Start the day after chemo for 2 days. (Patient not taking: Reported on 07/25/2016) 8 tablet 5  . fentaNYL (DURAGESIC - DOSED MCG/HR) 12 MCG/HR Place 1 patch (12.5 mcg total) onto the skin every 3 (three) days. (Patient not taking: Reported on 07/25/2016) 10 patch 0  . lidocaine-prilocaine (EMLA) cream Apply 1 application topically once. (Patient not taking: Reported on 07/25/2016) 30 g 1  . lisinopril-hydrochlorothiazide (PRINZIDE,ZESTORETIC) 10-12.5 MG tablet Take 1 tablet by mouth daily.    [name] Kitchen loperamide (IMODIUM A-D) 2 MG tablet Take 1 tablet (2 mg total) by mouth 4 (four) times daily as needed. Take 2 at diarrhea onset , then 1 every 2hr until 12hrs with no BM. (Patient not taking: Reported on 07/25/2016) 100 tablet 1  . LORazepam (ATIVAN) 0.5 MG tablet TAKE 1  TABLET BY MOUTH EVERY 6 HOURS AS NEEDED FOR NAUSEA AND VOMITING (Patient not taking: Reported on 07/25/2016) 30 tablet 0  . ondansetron (ZOFRAN) 8 MG tablet Take 1 tablet (8 mg total) by mouth every 8 (eight) hours as needed for nausea or vomiting. (Patient not taking: Reported on 07/25/2016) 30 tablet 3  . ondansetron (ZOFRAN) 8 MG tablet Take 1 tablet (8 mg total) by mouth 2 (two) times daily as needed for refractory nausea / vomiting. Start on day 3 after chemotherapy. (Patient not taking: Reported on 07/25/2016) 30 tablet 1  . polyethylene glycol (MIRALAX / GLYCOLAX) packet Take 17 g by mouth daily. (Patient not  taking: Reported on 07/25/2016) 30 each 0  . prochlorperazine (COMPAZINE) 10 MG tablet Take 1 tablet (10 mg total) by mouth every 6 (six) hours as needed (NAUSEA). (Patient not taking: Reported on 07/25/2016) 30 tablet 1  . senna-docusate (SENNA S) 8.6-50 MG tablet Take 2 tablets by mouth at bedtime. (Patient not taking: Reported on 07/25/2016) 60 tablet 1  . VIAGRA 100 MG tablet Take 100 mg by mouth daily as needed for erectile dysfunction. Reported on 10/22/2015     Current Facility-Administered Medications  Medication Dose Route Frequency Provider Last Rate Last Dose  . 0.9 %  sodium chloride infusion  500 mL Intravenous Continuous Jerene Bears, MD        Physical Findings:  height is 5\' 9"  (1.753 m) and weight is 114 lb 6.4 oz (51.9 kg). His oral temperature is 98.7 F (37.1 C). His blood pressure is 149/88 (abnormal) and his pulse is 102 (abnormal). His respiration is 16 and oxygen saturation is 100%.  In general this is a cachectic and chronically ill appearing African American male in no acute distress. He's alert and oriented x4 and appropriate throughout the examination. Cardiopulmonary assessment is negative for acute distress and he exhibits normal effort. His posterior lumbar spine region is evaluated and his skin has mild pigmentation consistent with prior radiotherapy. No desquamation is noted.   Lab Findings: Lab Results  Component Value Date   WBC 12.5 (H) 07/13/2016   HGB 8.7 (L) 07/13/2016   HCT 27.2 (L) 07/13/2016   MCV 91.3 07/13/2016   PLT 673 (H) 07/13/2016     Radiographic Findings: Ir Gastrostomy Tube Mod Sed  Result Date: 07/05/2016 INDICATION: Esophageal cancer EXAM: PERC PLACEMENT GASTROSTOMY MEDICATIONS: Ancef 2 g; Antibiotics were administered within 1 hour of the procedure. Glucagon 1 mg IV. ANESTHESIA/SEDATION: Versed 6 mg IV; Fentanyl 300 mcg IV Moderate Sedation Time:  40 The patient was continuously monitored during the procedure by the interventional  radiology nurse under my direct supervision. CONTRAST:  39mL ISOVUE-300 IOPAMIDOL (ISOVUE-300) INJECTION 61% - administered into the gastric lumen. FLUOROSCOPY TIME:  Fluoroscopy Time: 8 minutes 30 seconds (57 mGy). COMPLICATIONS: None immediate. PROCEDURE: The procedure, risks, benefits, and alternatives were explained to the patient. Questions regarding the procedure were encouraged and answered. The patient understands and consents to the procedure. The epigastrium was prepped with Betadine in a sterile fashion, and a sterile drape was applied covering the operative field. A sterile gown and sterile gloves were used for the procedure. A 5-French orogastric tube is placed under fluoroscopic guidance. Scout imaging of the abdomen confirms barium within the transverse colon. The stomach was distended with gas. Under fluoroscopic guidance, an 18 gauge needle was utilized to puncture the anterior wall of the body of the stomach. An Amplatz wire was advanced through the needle passing a T fastener into  the lumen of the stomach. 66 T fasteners were secured for gastropexy. The 18 gauge needle was then advanced into the lumen of the stomach and removed over an Amplatz wire. The graduated 18 French peel-away sheath was then advanced over the Amplatz. An 92 French gastrostomy tube was advanced over the wire and through the peel-away sheath. The balloon was insufflated with 10 cc saline and secured in place. Contrast was injected. Findings The image demonstrates placement of a 35 French balloon retention type gastrostomy tube into the body of the stomach. IMPRESSION: Successful 18 French balloon retention gastrostomy. Electronically Signed   By: Marybelle Killings M.D.   On: 07/05/2016 15:31   Dg Esophagus  Result Date: 07/03/2016 CLINICAL DATA:  55 year old male with esophageal cancer. Finished chemotherapy 2 - 3 months ago and radiation therapy 1 month ago. Subsequent encounter. EXAM: ESOPHOGRAM/BARIUM SWALLOW TECHNIQUE:  Single contrast examination was performed using  thin barium. FLUOROSCOPY TIME:  Fluoroscopy Time:  18 seconds Radiation Exposure Index (if provided by the fluoroscopic device): 15.8 mGy Number of Acquired Spot Images: 38 COMPARISON:  06/05/2016 CT. FINDINGS: No laryngeal penetration or aspiration. Mild narrowing cervical esophagus. Irregular stricture mid to distal third of the esophagus consistent with patient's known malignancy spans over 4.6 cm. Inferior extent located 8.2 cm proximal to the gastroesophageal junction. No obstruction at the level of the gastroesophageal junction. IMPRESSION: Irregular stricture mid to distal third of the esophagus consistent with patient's known malignancy spans over 4.6 cm. Inferior extent located 8.2 cm proximal to the gastroesophageal junction. Electronically Signed   By: Genia Del M.D.   On: 07/03/2016 14:51   Ir Radiologist Eval & Mgmt  Result Date: 07/20/2016 Ascencion Dike, PA-C     07/20/2016 12:55 PM Pt returns for removal of T-fasteners. S/p Perc G-tube on 11/1 Has been doing well. 3 T-fasteners removed without difficulty. Bumper is snug, no leak. Follow up prn. Ascencion Dike PA-C Interventional Radiology 07/20/2016 12:54 PM   Ir Radiologist Eval & Mgmt  Result Date: 07/10/2016 Mr Hudecek is here today for recheck of his recently placed gastrostomy tube.  It was oozing post-operatively and QuickClot was placed around the tube. I removed this today without difficult, examined the site = looks good, no bleeding) and re-dressed the g-tube with 4x4 drain sponges and Hypafix tape.  I also instructed his wife how to flush the tube.  WENDY S BLAIR PA-C 07/07/2016 2:53 PM     Impression/Plan: 1. Stage IV squamous cell carcinoma of the esophagus with disease in the lumbosacral spine. The patient has tolerated radiotherapy well since completing both treatment to the esophagus and lumbosacral spine. unfortunately he has progressive disease requiring chemotherapy.  We would be happy to follow along with him, but given the interventions currently, I will see him back as needed. He understands to call if he has progressive pain in the thoracic spine which we could consider treating at a later time. 2. Protein calorie malnutrition. The patient will continue to follow up with nutrition services to discuss other options to maintain his nutritional status and to improve on where is is currently.      Carola Rhine, PAC

## 2016-07-28 ENCOUNTER — Ambulatory Visit: Payer: 59

## 2016-07-28 ENCOUNTER — Other Ambulatory Visit: Payer: 59

## 2016-07-28 ENCOUNTER — Telehealth: Payer: Self-pay

## 2016-07-28 DIAGNOSIS — C159 Malignant neoplasm of esophagus, unspecified: Secondary | ICD-10-CM

## 2016-07-28 MED ORDER — OXYCODONE HCL 5 MG/5ML PO SOLN: 5.0000 mg | ORAL | 0 refills | Status: DC | PRN

## 2016-07-28 MED FILL — oxyCODONE HCL 5 MG/5ML SOLN: 5 | 8 days supply | Qty: 473 | Fill #0

## 2016-07-28 NOTE — Telephone Encounter (Signed)
Pt called for oxycodone refill. rx prepared for Dr Alen Blew signature. Dr Irene Limbo not in office. Called pt when rx ready.

## 2016-07-31 ENCOUNTER — Other Ambulatory Visit: Payer: Self-pay

## 2016-07-31 ENCOUNTER — Other Ambulatory Visit: Payer: Self-pay | Admitting: Hematology

## 2016-07-31 ENCOUNTER — Observation Stay (HOSPITAL_COMMUNITY)
Admission: EM | Admit: 2016-07-31 | Discharge: 2016-08-02 | Disposition: A | Discharge status: Home / Self Care | Payer: 59 | Attending: Internal Medicine | Admitting: Internal Medicine

## 2016-07-31 ENCOUNTER — Other Ambulatory Visit: Payer: Self-pay | Admitting: *Deleted

## 2016-07-31 ENCOUNTER — Encounter (HOSPITAL_COMMUNITY): Payer: Self-pay | Admitting: Emergency Medicine

## 2016-07-31 ENCOUNTER — Ambulatory Visit: Payer: 59 | Admitting: Nutrition

## 2016-07-31 ENCOUNTER — Ambulatory Visit (HOSPITAL_BASED_OUTPATIENT_CLINIC_OR_DEPARTMENT_OTHER): Payer: 59

## 2016-07-31 ENCOUNTER — Encounter: Payer: Self-pay | Admitting: *Deleted

## 2016-07-31 ENCOUNTER — Other Ambulatory Visit (HOSPITAL_BASED_OUTPATIENT_CLINIC_OR_DEPARTMENT_OTHER): Payer: 59

## 2016-07-31 ENCOUNTER — Ambulatory Visit (HOSPITAL_COMMUNITY)
Admission: RE | Admit: 2016-07-31 | Discharge: 2016-07-31 | Disposition: A | Discharge status: Home / Self Care | Payer: 59 | Source: Ambulatory Visit | Attending: Hematology | Admitting: Hematology

## 2016-07-31 ENCOUNTER — Emergency Department (HOSPITAL_COMMUNITY): Payer: 59

## 2016-07-31 VITALS — BP 122/95 | HR 90 | Temp 98.9°F | Resp 18

## 2016-07-31 DIAGNOSIS — G893 Neoplasm related pain (acute) (chronic): Secondary | ICD-10-CM | POA: Diagnosis not present

## 2016-07-31 DIAGNOSIS — E440 Moderate protein-calorie malnutrition: Secondary | ICD-10-CM

## 2016-07-31 DIAGNOSIS — Z5111 Encounter for antineoplastic chemotherapy: Secondary | ICD-10-CM | POA: Diagnosis not present

## 2016-07-31 DIAGNOSIS — Z95828 Presence of other vascular implants and grafts: Secondary | ICD-10-CM

## 2016-07-31 DIAGNOSIS — R0789 Other chest pain: Secondary | ICD-10-CM | POA: Diagnosis present

## 2016-07-31 DIAGNOSIS — Z8501 Personal history of malignant neoplasm of esophagus: Secondary | ICD-10-CM | POA: Insufficient documentation

## 2016-07-31 DIAGNOSIS — Z923 Personal history of irradiation: Secondary | ICD-10-CM | POA: Diagnosis not present

## 2016-07-31 DIAGNOSIS — I10 Essential (primary) hypertension: Secondary | ICD-10-CM | POA: Insufficient documentation

## 2016-07-31 DIAGNOSIS — D649 Anemia, unspecified: Secondary | ICD-10-CM | POA: Diagnosis not present

## 2016-07-31 DIAGNOSIS — R079 Chest pain, unspecified: Secondary | ICD-10-CM | POA: Diagnosis present

## 2016-07-31 DIAGNOSIS — C155 Malignant neoplasm of lower third of esophagus: Secondary | ICD-10-CM | POA: Diagnosis not present

## 2016-07-31 DIAGNOSIS — C787 Secondary malignant neoplasm of liver and intrahepatic bile duct: Secondary | ICD-10-CM | POA: Insufficient documentation

## 2016-07-31 DIAGNOSIS — C159 Malignant neoplasm of esophagus, unspecified: Secondary | ICD-10-CM

## 2016-07-31 DIAGNOSIS — Z79899 Other long term (current) drug therapy: Secondary | ICD-10-CM | POA: Diagnosis not present

## 2016-07-31 DIAGNOSIS — Z931 Gastrostomy status: Secondary | ICD-10-CM | POA: Insufficient documentation

## 2016-07-31 DIAGNOSIS — R1319 Other dysphagia: Secondary | ICD-10-CM

## 2016-07-31 DIAGNOSIS — C7951 Secondary malignant neoplasm of bone: Secondary | ICD-10-CM

## 2016-07-31 DIAGNOSIS — R1310 Dysphagia, unspecified: Secondary | ICD-10-CM | POA: Diagnosis not present

## 2016-07-31 DIAGNOSIS — M545 Low back pain: Secondary | ICD-10-CM | POA: Diagnosis not present

## 2016-07-31 DIAGNOSIS — F17210 Nicotine dependence, cigarettes, uncomplicated: Secondary | ICD-10-CM | POA: Diagnosis not present

## 2016-07-31 LAB — CBC WITH DIFFERENTIAL/PLATELET
Basophils Absolute: 0 10*3/uL (ref 0.0–0.1)
Basophils Relative: 0 %
Eosinophils Absolute: 0 10*3/uL (ref 0.0–0.7)
Eosinophils Relative: 0 %
HCT: 24.8 % — ABNORMAL LOW (ref 39.0–52.0)
Hemoglobin: 8.1 g/dL — ABNORMAL LOW (ref 13.0–17.0)
Lymphocytes Relative: 8 %
Lymphs Abs: 0.9 10*3/uL (ref 0.7–4.0)
MCH: 28.2 pg (ref 26.0–34.0)
MCHC: 32.7 g/dL (ref 30.0–36.0)
MCV: 86.4 fL (ref 78.0–100.0)
Monocytes Absolute: 0.2 10*3/uL (ref 0.1–1.0)
Monocytes Relative: 2 %
Neutro Abs: 9.1 10*3/uL — ABNORMAL HIGH (ref 1.7–7.7)
Neutrophils Relative %: 90 %
Platelets: 597 10*3/uL — ABNORMAL HIGH (ref 150–400)
RBC: 2.87 MIL/uL — ABNORMAL LOW (ref 4.22–5.81)
RDW: 16.2 % — ABNORMAL HIGH (ref 11.5–15.5)
WBC: 10.2 10*3/uL (ref 4.0–10.5)

## 2016-07-31 LAB — COMPREHENSIVE METABOLIC PANEL
ALT: 17 U/L (ref 0–55)
ALT: 19 U/L (ref 17–63)
AST: 57 U/L — AB (ref 5–34)
AST: 55 U/L — ABNORMAL HIGH (ref 15–41)
Albumin: 2.8 g/dL — ABNORMAL LOW (ref 3.5–5.0)
Albumin: 3.6 g/dL (ref 3.5–5.0)
Alkaline Phosphatase: 387 U/L — ABNORMAL HIGH (ref 40–150)
Alkaline Phosphatase: 323 U/L — ABNORMAL HIGH (ref 38–126)
Anion Gap: 11 mEq/L (ref 3–11)
Anion gap: 12 (ref 5–15)
BUN: 7.2 mg/dL (ref 7.0–26.0)
BUN: 9 mg/dL (ref 6–20)
CHLORIDE: 90 meq/L — AB (ref 98–109)
CO2: 28 mEq/L (ref 22–29)
CO2: 28 mmol/L (ref 22–32)
CREATININE: 0.5 mg/dL — AB (ref 0.7–1.3)
Calcium: 9.6 mg/dL (ref 8.4–10.4)
Calcium: 9.3 mg/dL (ref 8.9–10.3)
Chloride: 90 mmol/L — ABNORMAL LOW (ref 101–111)
Creatinine, Ser: 0.52 mg/dL — ABNORMAL LOW (ref 0.61–1.24)
EGFR: >90 mL/min/{1.73_m2} (ref >90)
GFR calc Af Amer: >60 mL/min (ref >60)
GFR calc non Af Amer: >60 mL/min (ref >60)
GLUCOSE: 124 mg/dL (ref 70–140)
Glucose, Bld: 130 mg/dL — ABNORMAL HIGH (ref 65–99)
POTASSIUM: 4.3 meq/L (ref 3.5–5.1)
Potassium: 4.4 mmol/L (ref 3.5–5.1)
SODIUM: 129 meq/L — AB (ref 136–145)
Sodium: 130 mmol/L — ABNORMAL LOW (ref 135–145)
Total Bilirubin: 0.33 mg/dL (ref 0.20–1.20)
Total Bilirubin: 1 mg/dL (ref 0.3–1.2)
Total Protein: 7.4 g/dL (ref 6.4–8.3)
Total Protein: 8.1 g/dL (ref 6.5–8.1)

## 2016-07-31 LAB — URINALYSIS, ROUTINE W REFLEX MICROSCOPIC
Bilirubin Urine: NEGATIVE
Glucose, UA: NEGATIVE mg/dL
Hgb urine dipstick: NEGATIVE
Ketones, ur: NEGATIVE mg/dL
Leukocytes, UA: NEGATIVE
Nitrite: NEGATIVE
Protein, ur: NEGATIVE mg/dL
Specific Gravity, Urine: 1.012 (ref 1.005–1.030)
pH: 7 (ref 5.0–8.0)

## 2016-07-31 LAB — CBC & DIFF AND RETIC
BASO%: 0.2 % (ref 0.0–2.0)
Basophils Absolute: 0 10*3/uL (ref 0.0–0.1)
EOS ABS: 0.1 10*3/uL (ref 0.0–0.5)
EOS%: 1 % (ref 0.0–7.0)
HCT: 25.1 % — ABNORMAL LOW (ref 38.4–49.9)
HEMOGLOBIN: 8 g/dL — AB (ref 13.0–17.1)
IMMATURE RETIC FRACT: 23.9 % — AB (ref 3.00–10.60)
LYMPH#: 1.6 10*3/uL (ref 0.9–3.3)
LYMPH%: 14.5 % (ref 14.0–49.0)
MCH: 27.8 pg (ref 27.2–33.4)
MCHC: 31.9 g/dL — ABNORMAL LOW (ref 32.0–36.0)
MCV: 87.2 fL (ref 79.3–98.0)
MONO#: 1.5 10*3/uL — AB (ref 0.1–0.9)
MONO%: 13.5 % (ref 0.0–14.0)
NEUT%: 70.8 % (ref 39.0–75.0)
NEUTROS ABS: 7.6 10*3/uL — AB (ref 1.5–6.5)
Platelets: 530 10*3/uL — ABNORMAL HIGH (ref 140–400)
RBC: 2.88 10*6/uL — AB (ref 4.20–5.82)
RDW: 16.5 % — AB (ref 11.0–14.6)
RETIC %: 3.47 % — AB (ref 0.80–1.80)
Retic Ct Abs: 99.94 10*3/uL — ABNORMAL HIGH (ref 34.80–93.90)
WBC: 10.7 10*3/uL — AB (ref 4.0–10.3)

## 2016-07-31 LAB — TROPONIN I: Troponin I: 0.03 ng/mL (ref <0.03)

## 2016-07-31 LAB — I-STAT CG4 LACTIC ACID, ED: Lactic Acid, Venous: 1.46 mmol/L (ref 0.5–1.9)

## 2016-07-31 MED ORDER — IRINOTECAN HCL CHEMO INJECTION 100 MG/5ML
180.0000 mg/m2 | Freq: Once | INTRAVENOUS | Status: AC
  Administered 2016-07-31: 300 mg via INTRAVENOUS
  Filled 2016-07-31: qty 15

## 2016-07-31 MED ORDER — LORAZEPAM 2 MG/ML IJ SOLN: 0.5000 mg | Freq: Once | INTRAMUSCULAR | Status: DC

## 2016-07-31 MED ORDER — HYDROMORPHONE HCL 1 MG/ML IJ SOLN
0.5000 mg | Freq: Once | INTRAMUSCULAR | Status: AC
  Administered 2016-07-31: 0.5 mg via INTRAVENOUS
  Filled 2016-07-31: qty 0.5

## 2016-07-31 MED ORDER — ATROPINE SULFATE 1 MG/ML IJ SOLN: 0.5000 mg | Freq: Once | INTRAMUSCULAR | Status: DC | PRN

## 2016-07-31 MED ORDER — HEPARIN SOD (PORK) LOCK FLUSH 100 UNIT/ML IV SOLN
500.0000 [IU] | Freq: Once | INTRAVENOUS | Status: DC | PRN
  Filled 2016-07-31: qty 5

## 2016-07-31 MED ORDER — DENOSUMAB 120 MG/1.7ML ~~LOC~~ SOLN
120.0000 mg | Freq: Once | SUBCUTANEOUS | Status: AC
  Administered 2016-07-31: 120 mg via SUBCUTANEOUS
  Filled 2016-07-31: qty 1.7

## 2016-07-31 MED ORDER — ALTEPLASE 2 MG IJ SOLR
2.0000 mg | Freq: Once | INTRAMUSCULAR | Status: DC | PRN
  Filled 2016-07-31: qty 2

## 2016-07-31 MED ORDER — SODIUM CHLORIDE 0.9 % IV BOLUS (SEPSIS)
1000.0000 mL | Freq: Once | INTRAVENOUS | Status: AC
  Administered 2016-07-31: 1000 mL via INTRAVENOUS

## 2016-07-31 MED ORDER — LEUCOVORIN CALCIUM INJECTION 350 MG
400.0000 mg/m2 | Freq: Once | INTRAVENOUS | Status: AC
  Administered 2016-07-31: 652 mg via INTRAVENOUS
  Filled 2016-07-31: qty 32.6

## 2016-07-31 MED ORDER — SODIUM CHLORIDE 0.9 % IV SOLN
Freq: Once | INTRAVENOUS | Status: AC
  Administered 2016-07-31: 13:00:00 via INTRAVENOUS

## 2016-07-31 MED ORDER — PALONOSETRON HCL INJECTION 0.25 MG/5ML
INTRAVENOUS | Status: AC
  Filled 2016-07-31: qty 5

## 2016-07-31 MED ORDER — FLUOROURACIL CHEMO INJECTION 2.5 GM/50ML
400.0000 mg/m2 | Freq: Once | INTRAVENOUS | Status: AC
  Administered 2016-07-31: 650 mg via INTRAVENOUS
  Filled 2016-07-31: qty 13

## 2016-07-31 MED ORDER — DEXAMETHASONE SODIUM PHOSPHATE 10 MG/ML IJ SOLN
INTRAMUSCULAR | Status: AC
  Filled 2016-07-31: qty 1

## 2016-07-31 MED ORDER — SODIUM CHLORIDE 0.9 % IJ SOLN
10.0000 mL | INTRAMUSCULAR | Status: DC | PRN
  Filled 2016-07-31: qty 10

## 2016-07-31 MED ORDER — ATROPINE SULFATE 1 MG/ML IJ SOLN
INTRAMUSCULAR | Status: AC
  Filled 2016-07-31: qty 1

## 2016-07-31 MED ORDER — PALONOSETRON HCL INJECTION 0.25 MG/5ML
0.2500 mg | Freq: Once | INTRAVENOUS | Status: AC
  Administered 2016-07-31: 0.25 mg via INTRAVENOUS

## 2016-07-31 MED ORDER — SODIUM CHLORIDE 0.9 % IV SOLN
2400.0000 mg/m2 | INTRAVENOUS | Status: DC
  Administered 2016-07-31: 3900 mg via INTRAVENOUS
  Filled 2016-07-31: qty 78

## 2016-07-31 MED ORDER — DEXAMETHASONE SODIUM PHOSPHATE 10 MG/ML IJ SOLN
10.0000 mg | Freq: Once | INTRAMUSCULAR | Status: AC
Start: 2016-07-31 — End: 2016-07-31
  Administered 2016-07-31: 10 mg via INTRAVENOUS

## 2016-07-31 MED ORDER — HYDROMORPHONE HCL 4 MG/ML IJ SOLN
INTRAMUSCULAR | Status: AC
  Filled 2016-07-31: qty 1

## 2016-07-31 NOTE — ED Notes (Signed)
Patient return from CT

## 2016-07-31 NOTE — Progress Notes (Signed)
Oncology Nurse Navigator Documentation  Oncology Nurse Navigator Flowsheets 07/31/2016  Navigator Location [facility]  Referral date to RadOnc/MedOnc -  Navigator Encounter Type Treatment  Telephone -  Abnormal Finding Date -  Confirmed Diagnosis Date -  Treatment Initiated Date -  Patient Visit Type MedOnc  Treatment Phase First Chemo Tx--1st FOLFIRI  Barriers/Navigation Needs Family concerns--he says he can't tolerate 1 1/2 cans of formula at one time. Can only manage 1 can qid. Mom is concerned that he is still losing weight.  Education Other--suggested taking 1 can formula 5/day  Interventions Referrals  Referrals Nutrition/dietician  Coordination of Care -  Education Method Verbal  Support Groups/Services -  Specialty Items/DME -  Acuity -  Acuity Level 2 -  Time Spent with Patient 15

## 2016-07-31 NOTE — Progress Notes (Signed)
Nutrition follow-up completed with patient diagnosed with esophageal cancer. Patient complains of early satiety. He tolerates 1 bottle of Osmolite 1.5 with 120 cc water before and after 4 times a day. He reports no difficulty swallowing water. He denies nausea and vomiting. Weight decreased and documented as 114.4 pounds on November 21.  Estimated nutrition needs: 1900-2100 calories, 78-90 grams protein, 2.1 L fluid.  Nutrition diagnosis: Unintended weight loss continues.  Intervention: I educated patient to give one can Osmolite 1.5 every 3 hours to goal rate of 6 cans daily. Also encouraged patient to try to drink Ensure Plus by mouth. Asked patient to limit water intake one hour before tube feeding to increase tolerance and nutrition. Questions were answered.  Teach back method used.  Monitoring, evaluation, goals:  Patient will tolerate increased tube feeding or by mouth intake to promote weight gain.  Next visit: To be scheduled as needed.  **Disclaimer: This note was dictated with voice recognition software. Similar sounding words can inadvertently be transcribed and this note may contain transcription errors which may not have been corrected upon publication of note.**

## 2016-07-31 NOTE — ED Notes (Signed)
Patient transported to X-ray 

## 2016-07-31 NOTE — ED Provider Notes (Signed)
[facility] DEPT Provider Note   By signing my name below, I, [name], attest that this documentation has been prepared under the direction and in the presence of [employer], PA-C. Electronically Signed: [name], ED Scribe. 07/31/16. 11:38 PM.   History   Chief Complaint Chief Complaint  Patient presents with  . Medication Reaction   The history is provided by the patient and medical records. No language interpreter was used.    HPI Comments:  Samuel Henderson is a 55 y.o. male with PMHx of bone and esophageal cancer who presents to the Emergency Department complaining of diaphoresis that began during a new chemo treatment that he received about 7 hours ago. He reports some mild aching in his chest around his port site as well. He has not done anything to treat his symptoms. He denies modifying factors. He denies nausea, vomiting, SOB, cough, dysuria, fever. He reports having had radiation within the past few weeks also.   Past Medical History:  Diagnosis Date  . Arthritis   . Bone cancer ([place])   . Esophageal cancer ([place])   . History of chemotherapy   . History of radiation therapy   . Hypertension   . Knee pain     Patient Active Problem List   Diagnosis Date Noted  . Fever 05/13/2016  . SIRS (systemic inflammatory response syndrome) ([place]) 05/13/2016  . Septic joint of right knee joint ([place]) 04/14/2016  . Port catheter in place 02/08/2016  . Bone metastases ([place]) 02/08/2016  . Dehydration 11/16/2015  . URI (upper respiratory infection) 11/16/2015  . Liver metastases ([place]) 10/27/2015  . Hypokalemia 10/16/2015  . Squamous cell cancer of lower third of esophagus ([place]) 09/15/2015  . Primary esophageal squamous cell carcinoma ([place]) 09/15/2015  . Dysphagia 09/15/2015  . HTN (hypertension) 09/02/2015    Past Surgical History:  Procedure Laterality Date  . ESOPHAGOGASTRODUODENOSCOPY (EGD) WITH ESOPHAGEAL DILATION     and biopsy  . IR GENERIC  HISTORICAL  07/05/2016   IR GASTROSTOMY TUBE MOD SED 07/05/2016 [facility]INTER[facility]  . IR GENERIC HISTORICAL  07/07/2016   IR RADIOLOGIST EVAL & MGMT 07/07/2016 [name], PA-C [facility]  . IR GENERIC HISTORICAL  07/20/2016   IR RADIOLOGIST EVAL & MGMT [facility]  . IRRIGATION AND DEBRIDEMENT KNEE Right 04/14/2016   Procedure: IRRIGATION AND DEBRIDEMENT RIGHT KNEE, ARTHROTOMY;  Surgeon: Paralee Cancel, MD;  Location: [facility] ORS;  Service: Orthopedics;  Laterality: Right;  . KNEE SURGERY    . LESION REMOVAL  1969   hip       Home Medications    Prior to Admission medications   Medication Sig Start Date End Date Taking? Authorizing Provider  Calcium Carbonate-Vitamin D (CALCIUM 600+D PO) Take 1 tablet by mouth daily.   Yes Historical Provider, MD  docusate sodium (COLACE) 100 MG capsule Take 100 mg by mouth daily as needed for mild constipation.   Yes Historical Provider, MD  fentaNYL (DURAGESIC - DOSED MCG/HR) 12 MCG/HR Place 1 patch (12.5 mcg total) onto the skin every 3 (three) days. 07/20/16  Yes Brunetta Genera, MD  gabapentin (NEURONTIN) 100 MG capsule Take 100 mg by mouth 6 (six) times daily. 06/20/16  Yes Historical Provider, MD  lidocaine-prilocaine (EMLA) cream Apply 1 application topically once. 12/07/15 12/06/16 Yes Niles, MD  methocarbamol (ROBAXIN) 500 MG tablet Take 500 mg by mouth 4 (four) times daily. 06/23/16  Yes Historical Provider, MD  omeprazole (PRILOSEC) 20 MG capsule TAKE ONE CAPSULE  BY MOUTH TWICE DAILY BEFORE A MEAL Patient taking differently: TAKE 48m BY MOUTH TWICE DAILY BEFORE A MEAL when needed for acid reflux/ heartburn 02/17/16  Yes Gautam KJuleen China MD  ondansetron (ZOFRAN) 8 MG tablet Take 1 tablet (8 mg total) by mouth every 8 (eight) hours as needed for nausea or vomiting. 05/09/16  Yes GBrunetta Genera MD  oxyCODONE (ROXICODONE) 5 MG/5ML solution Take 5-10 mLs (5-10 mg total) by mouth every 4 (four) hours as needed for moderate pain  or severe pain. 07/28/16  Yes FWyatt Portela MD  senna-docusate (SENNA S) 8.6-50 MG tablet Take 2 tablets by mouth at bedtime. 05/09/16  Yes GBrunetta Genera MD  VIAGRA 100 MG tablet Take 100 mg by mouth daily as needed for erectile dysfunction. Reported on 10/22/2015 09/10/15  Yes Historical Provider, MD  chlorhexidine (PERIDEX) 0.12 % solution Use as directed 15 mLs in the mouth or throat 2 (two) times daily. Swish and spit. Patient not taking: Reported on 07/31/2016 05/09/16   GBrunetta Genera MD  dexamethasone (DECADRON) 4 MG tablet Take 2 tablets (8 mg total) by mouth daily. Start the day after chemo for 2 days. Patient not taking: Reported on 07/31/2016 07/12/16   GBrunetta Genera MD  diclofenac (VOLTAREN) 75 MG EC tablet Take 75 mg by mouth 2 (two) times daily. 05/10/16   Historical Provider, MD  loperamide (IMODIUM A-D) 2 MG tablet Take 1 tablet (2 mg total) by mouth 4 (four) times daily as needed. Take 2 at diarrhea onset , then 1 every 2hr until 12hrs with no BM. Patient not taking: Reported on 07/31/2016 07/12/16   GBrunetta Genera MD  LORazepam (ATIVAN) 0.5 MG tablet TAKE 1 TABLET BY MOUTH EVERY 6 HOURS AS NEEDED FOR NAUSEA AND VOMITING Patient not taking: Reported on 07/31/2016 05/12/16   GBrunetta Genera MD  ondansetron (ZOFRAN) 8 MG tablet Take 1 tablet (8 mg total) by mouth 2 (two) times daily as needed for refractory nausea / vomiting. Start on day 3 after chemotherapy. Patient not taking: Reported on 07/31/2016 07/12/16   GBrunetta Genera MD  polyethylene glycol ([facility]/ GFloria Raveling packet Take 17 g by mouth daily. Patient not taking: Reported on 07/31/2016 05/09/16   GBrunetta Genera MD  prochlorperazine (COMPAZINE) 10 MG tablet Take 1 tablet (10 mg total) by mouth every 6 (six) hours as needed (NAUSEA). Patient not taking: Reported on 07/31/2016 07/12/16   GBrunetta Genera MD    Family History Family History  Problem Relation Age of Onset  . Esophageal  cancer Father   . Cancer Father     nasopharyngeal   . Diabetes Sister   . Cancer Sister     multiple myeloma  . Colon cancer Neg Hx     Social History Social History  Substance Use Topics  . Smoking status: Current Every Day Smoker    Packs/day: 0.50    Years: 20.00    Types: Cigarettes  . Smokeless tobacco: Never Used  . Alcohol use No     Allergies   Patient has no known allergies.   Review of Systems Review of Systems A complete 10 system review of systems was obtained and all systems are negative except as noted in the HPI and PMH.   Physical Exam Updated Vital Signs BP 165/99 (BP Location: Left Arm)   Pulse 101   Temp 97.9 F (36.6 C) (Oral)   Resp 17   SpO2 97%   Physical Exam  Constitutional: He is oriented to person, place, and time. He appears well-developed and well-nourished. No distress.  HENT:  Head: Normocephalic and atraumatic.  Mouth/Throat: Oropharynx is clear and moist.  Eyes: Pupils are equal, round, and reactive to light.  Neck: Normal range of motion. Neck supple.  Cardiovascular: Normal rate, regular rhythm and normal heart sounds.  Exam reveals no gallop and no friction rub.   No murmur heard. Pulmonary/Chest: Effort normal and breath sounds normal. No respiratory distress. He has no wheezes.  Abdominal: Soft. Bowel sounds are normal. He exhibits no distension. There is no tenderness.  Neurological: He is alert and oriented to person, place, and time. He exhibits normal muscle tone. Coordination normal.  Skin: Skin is warm. No rash noted. He is diaphoretic. No erythema.  Psychiatric: He has a normal mood and affect. His behavior is normal.  Nursing note and vitals reviewed.    ED Treatments / Results  DIAGNOSTIC STUDIES: Oxygen Saturation is 97% on RA, normal by my interpretation.   COORDINATION OF CARE: 8:44 PM- Will order IV fluids and labs. Pt verbalizes understanding and agrees to plan.  Medications  sodium chloride 0.9 %  bolus 1,000 mL (1,000 mLs Intravenous New Bag/Given 07/31/16 2208)    Labs (all labs ordered are listed, but only abnormal results are displayed) Labs Reviewed  COMPREHENSIVE METABOLIC PANEL - Abnormal; Notable for the following:       Result Value   Sodium 130 (*)    Chloride 90 (*)    Glucose, Bld 130 (*)    Creatinine, Ser 0.52 (*)    AST 55 (*)    Alkaline Phosphatase 323 (*)    All other components within normal limits  CBC WITH DIFFERENTIAL/PLATELET - Abnormal; Notable for the following:    RBC 2.87 (*)    Hemoglobin 8.1 (*)    HCT 24.8 (*)    RDW 16.2 (*)    Platelets 597 (*)    Neutro Abs 9.1 (*)    All other components within normal limits  TROPONIN I - Abnormal; Notable for the following:    Troponin I 0.03 (*)    All other components within normal limits  URINALYSIS, ROUTINE W REFLEX MICROSCOPIC (NOT AT [facility])  I-STAT CG4 LACTIC ACID, ED    EKG  EKG Interpretation None       Radiology Dg Chest 2 View  Result Date: 07/31/2016 CLINICAL DATA:  Chemotherapy earlier today. Chest pain beginning a few hours ago. History of esophageal cancer. Smoker. EXAM: CHEST  2 VIEW COMPARISON:  05/13/2016 FINDINGS: Power port type right central venous catheter with tip over the low SVC region. No pneumothorax. Shallow inspiration with elevation of right hemidiaphragm. Linear atelectasis or scarring in the right lung base. No focal airspace disease or consolidation in the lungs. No blunting of costophrenic angles. Calcification of the aorta. Cardiac enlargement without vascular congestion. IMPRESSION: Shallow inspiration with elevation of right hemidiaphragm. Linear atelectasis in the right lung base. Cardiac enlargement. No focal consolidation or edema. Electronically Signed   By: Lucienne Capers M.D.   On: 07/31/2016 22:33    Procedures Procedures (including critical care time)  Medications Ordered in ED Medications  sodium chloride 0.9 % bolus 1,000 mL (1,000 mLs Intravenous  New Bag/Given 07/31/16 2208)     Initial Impression / Assessment and Plan / ED Course  I have reviewed the triage vital signs and the nursing notes.  Pertinent labs & imaging results that were available during my care of the patient  were reviewed by me and considered in my medical decision making (see chart for details).  Clinical Course     Patient be admitted for further evaluation of his elevated troponin, along with the chest discomfort and the chemotherapy agents being implemented.  Patient is advised plan and all questions were answered  Final Clinical Impressions(s) / ED Diagnoses   Final diagnoses:  None    New Prescriptions New Prescriptions   No medications on file     Dalia Heading, PA-C 08/01/16 0630    Davonna Belling, MD 08/01/16 2316

## 2016-07-31 NOTE — Progress Notes (Signed)
Hemoglobin 8, Sodium 129, Alkaline Phosphatase 387, Per Dr. Irene Limbo okay to treat. Pt to have 2 units PRBCs this week. Per Darden Dates RN appt has been set up at [facility] on Thursday 08/03/16 for blood transfusion. Pt to have type and cross drawn on wednesday 08/02/16 when pt returns to clinic to have Adrucil pump disconnected. Pt and patients mothers aware and verbalizes understanding.  Blood return noted before and after Adrucil push. Pt tolerated infusion well, pt stable at discharge. Discharge education performed, pt and patient's mother verbalized understanding.

## 2016-07-31 NOTE — ED Triage Notes (Addendum)
Pt states that he started getting a 48 hour chemo at 1-130 today and now he feels very diaphoretic and weak and has some chest discomfort. Alert and oriented.

## 2016-07-31 NOTE — ED Notes (Signed)
Bed: WA12 Expected date:  Expected time:  Means of arrival:  Comments: Triage 1  

## 2016-07-31 NOTE — Patient Instructions (Addendum)
[facility] Discharge Instructions for Patients Receiving Chemotherapy  Today you received the following chemotherapy agents: Irinotecan, Leucovorin and Adrucil  To help prevent nausea and vomiting after your treatment, we encourage you to take your nausea medication as directed. DO NOT take Zofran 8mg  until 08/03/16. You may continue to take your compazine every 6 hours as directed.    If you develop nausea and vomiting that is not controlled by your nausea medication, call the clinic.   BELOW ARE SYMPTOMS THAT SHOULD BE REPORTED IMMEDIATELY:  *FEVER GREATER THAN 100.5 F  *CHILLS WITH OR WITHOUT FEVER  NAUSEA AND VOMITING THAT IS NOT CONTROLLED WITH YOUR NAUSEA MEDICATION  *UNUSUAL SHORTNESS OF BREATH  *UNUSUAL BRUISING OR BLEEDING  TENDERNESS IN MOUTH AND THROAT WITH OR WITHOUT PRESENCE OF ULCERS  *URINARY PROBLEMS  *BOWEL PROBLEMS  UNUSUAL RASH Items with * indicate a potential emergency and should be followed up as soon as possible.  Feel free to call the clinic you have any questions or concerns. The clinic phone number is [phone].  Please show the [facility] at check-in to the Emergency Department and triage nurse.  Irinotecan injection What is this medicine? IRINOTECAN (ir in oh TEE kan ) is a chemotherapy drug. It is used to treat colon and rectal cancer. This medicine may be used for other purposes; ask your health care provider or pharmacist if you have questions. COMMON BRAND NAME(S): Camptosar What should I tell my health care provider before I take this medicine? They need to know if you have any of these conditions: -blood disorders -dehydration -diarrhea -infection (especially a virus infection such as chickenpox, cold sores, or herpes) -liver disease -low blood counts, like low white cell, platelet, or red cell counts -recent or ongoing radiation therapy -an unusual or allergic reaction to irinotecan, sorbitol, other  chemotherapy, other medicines, foods, dyes, or preservatives -pregnant or trying to get pregnant -breast-feeding How should I use this medicine? This drug is given as an infusion into a vein. It is administered in a hospital or clinic by a specially trained health care professional. Talk to your pediatrician regarding the use of this medicine in children. Special care may be needed. Overdosage: If you think you have taken too much of this medicine contact a poison control center or emergency room at once. NOTE: This medicine is only for you. Do not share this medicine with others. What if I miss a dose? It is important not to miss your dose. Call your doctor or health care professional if you are unable to keep an appointment. What may interact with this medicine? Do not take this medicine with any of the following medications: -atazanavir -certain medicines for fungal infections like itraconazole and ketoconazole -St. John's Wort This medicine may also interact with the following medications: -dexamethasone -diuretics -laxatives -medicines for seizures like carbamazepine, mephobarbital, phenobarbital, phenytoin, primidone -medicines to increase blood counts like filgrastim, pegfilgrastim, sargramostim -prochlorperazine -vaccines This list may not describe all possible interactions. Give your health care provider a list of all the medicines, herbs, non-prescription drugs, or dietary supplements you use. Also tell them if you smoke, drink alcohol, or use illegal drugs. Some items may interact with your medicine. What should I watch for while using this medicine? Your condition will be monitored carefully while you are receiving this medicine. You will need important blood work done while you are taking this medicine. This drug may make you feel generally unwell. This is not uncommon, as chemotherapy  can affect healthy cells as well as cancer cells. Report any side effects. Continue your  course of treatment even though you feel ill unless your doctor tells you to stop. In some cases, you may be given additional medicines to help with side effects. Follow all directions for their use. You may get drowsy or dizzy. Do not drive, use machinery, or do anything that needs mental alertness until you know how this medicine affects you. Do not stand or sit up quickly, especially if you are an older patient. This reduces the risk of dizzy or fainting spells. Call your doctor or health care professional for advice if you get a fever, chills or sore throat, or other symptoms of a cold or flu. Do not treat yourself. This drug decreases your body's ability to fight infections. Try to avoid being around people who are sick. This medicine may increase your risk to bruise or bleed. Call your doctor or health care professional if you notice any unusual bleeding. Be careful brushing and flossing your teeth or using a toothpick because you may get an infection or bleed more easily. If you have any dental work done, tell your dentist you are receiving this medicine. Avoid taking products that contain aspirin, acetaminophen, ibuprofen, naproxen, or ketoprofen unless instructed by your doctor. These medicines may hide a fever. Do not become pregnant while taking this medicine. Women should inform their doctor if they wish to become pregnant or think they might be pregnant. There is a potential for serious side effects to an unborn child. Talk to your health care professional or pharmacist for more information. Do not breast-feed an infant while taking this medicine. What side effects may I notice from receiving this medicine? Side effects that you should report to your doctor or health care professional as soon as possible: -allergic reactions like skin rash, itching or hives, swelling of the face, lips, or tongue -low blood counts - this medicine may decrease the number of white blood cells, red blood cells  and platelets. You may be at increased risk for infections and bleeding. -signs of infection - fever or chills, cough, sore throat, pain or difficulty passing urine -signs of decreased platelets or bleeding - bruising, pinpoint red spots on the skin, black, tarry stools, blood in the urine -signs of decreased red blood cells - unusually weak or tired, fainting spells, lightheadedness -breathing problems -chest pain -diarrhea -feeling faint or lightheaded, falls -flushing, runny nose, sweating during infusion -mouth sores or pain -pain, swelling, redness or irritation where injected -pain, swelling, warmth in the leg -pain, tingling, numbness in the hands or feet -problems with balance, talking, walking -stomach cramps, pain -trouble passing urine or change in the amount of urine -vomiting as to be unable to hold down drinks or food -yellowing of the eyes or skin Side effects that usually do not require medical attention (report to your doctor or health care professional if they continue or are bothersome): -constipation -hair loss -headache -loss of appetite -nausea, vomiting -stomach upset This list may not describe all possible side effects. Call your doctor for medical advice about side effects. You may report side effects to FDA at 1-800-FDA-1088. Where should I keep my medicine? This drug is given in a hospital or clinic and will not be stored at home. NOTE: This sheet is a summary. It may not cover all possible information. If you have questions about this medicine, talk to your doctor, pharmacist, or health care provider.  2017 [employer] Standard (  2013-02-17 16:29:32)  Leucovorin injection What is this medicine? LEUCOVORIN (loo koe VOR in) is used to prevent or treat the harmful effects of some medicines. This medicine is used to treat anemia caused by a low amount of folic acid in the body. It is also used with 5-fluorouracil (5-FU) to treat colon cancer. This medicine  may be used for other purposes; ask your health care provider or pharmacist if you have questions. What should I tell my health care provider before I take this medicine? They need to know if you have any of these conditions: -anemia from low levels of vitamin B-12 in the blood -an unusual or allergic reaction to leucovorin, folic acid, other medicines, foods, dyes, or preservatives -pregnant or trying to get pregnant -breast-feeding How should I use this medicine? This medicine is for injection into a muscle or into a vein. It is given by a health care professional in a hospital or clinic setting. Talk to your pediatrician regarding the use of this medicine in children. Special care may be needed. Overdosage: If you think you have taken too much of this medicine contact a poison control center or emergency room at once. NOTE: This medicine is only for you. Do not share this medicine with others. What if I miss a dose? This does not apply. What may interact with this medicine? -capecitabine -fluorouracil -phenobarbital -phenytoin -primidone -trimethoprim-sulfamethoxazole This list may not describe all possible interactions. Give your health care provider a list of all the medicines, herbs, non-prescription drugs, or dietary supplements you use. Also tell them if you smoke, drink alcohol, or use illegal drugs. Some items may interact with your medicine. What should I watch for while using this medicine? Your condition will be monitored carefully while you are receiving this medicine. This medicine may increase the side effects of 5-fluorouracil, 5-FU. Tell your doctor or health care professional if you have diarrhea or mouth sores that do not get better or that get worse. What side effects may I notice from receiving this medicine? Side effects that you should report to your doctor or health care professional as soon as possible: -allergic reactions like skin rash, itching or hives,  swelling of the face, lips, or tongue -breathing problems -fever, infection -mouth sores -unusual bleeding or bruising -unusually weak or tired Side effects that usually do not require medical attention (report to your doctor or health care professional if they continue or are bothersome): -constipation or diarrhea -loss of appetite -nausea, vomiting This list may not describe all possible side effects. Call your doctor for medical advice about side effects. You may report side effects to FDA at 1-800-FDA-1088. Where should I keep my medicine? This drug is given in a hospital or clinic and will not be stored at home. NOTE: This sheet is a summary. It may not cover all possible information. If you have questions about this medicine, talk to your doctor, pharmacist, or health care provider.  2017 [employer]/Gold Standard (2008-02-25 16:50:29)    Fluorouracil, 5-FU injection What is this medicine? FLUOROURACIL, 5-FU (flure oh YOOR a sil) is a chemotherapy drug. It slows the growth of cancer cells. This medicine is used to treat many types of cancer like breast cancer, colon or rectal cancer, pancreatic cancer, and stomach cancer. This medicine may be used for other purposes; ask your health care provider or pharmacist if you have questions. COMMON BRAND NAME(S): Adrucil What should I tell my health care provider before I take this medicine? They need to know  if you have any of these conditions: -blood disorders -dihydropyrimidine dehydrogenase (DPD) deficiency -infection (especially a virus infection such as chickenpox, cold sores, or herpes) -kidney disease -liver disease -malnourished, poor nutrition -recent or ongoing radiation therapy -an unusual or allergic reaction to fluorouracil, other chemotherapy, other medicines, foods, dyes, or preservatives -pregnant or trying to get pregnant -breast-feeding How should I use this medicine? This drug is given as an infusion or injection  into a vein. It is administered in a hospital or clinic by a specially trained health care professional. Talk to your pediatrician regarding the use of this medicine in children. Special care may be needed. Overdosage: If you think you have taken too much of this medicine contact a poison control center or emergency room at once. NOTE: This medicine is only for you. Do not share this medicine with others. What if I miss a dose? It is important not to miss your dose. Call your doctor or health care professional if you are unable to keep an appointment. What may interact with this medicine? -allopurinol -cimetidine -dapsone -digoxin -hydroxyurea -leucovorin -levamisole -medicines for seizures like ethotoin, fosphenytoin, phenytoin -medicines to increase blood counts like filgrastim, pegfilgrastim, sargramostim -medicines that treat or prevent blood clots like warfarin, enoxaparin, and dalteparin -methotrexate -metronidazole -pyrimethamine -some other chemotherapy drugs like busulfan, cisplatin, estramustine, vinblastine -trimethoprim -trimetrexate -vaccines Talk to your doctor or health care professional before taking any of these medicines: -acetaminophen -aspirin -ibuprofen -ketoprofen -naproxen This list may not describe all possible interactions. Give your health care provider a list of all the medicines, herbs, non-prescription drugs, or dietary supplements you use. Also tell them if you smoke, drink alcohol, or use illegal drugs. Some items may interact with your medicine. What should I watch for while using this medicine? Visit your doctor for checks on your progress. This drug may make you feel generally unwell. This is not uncommon, as chemotherapy can affect healthy cells as well as cancer cells. Report any side effects. Continue your course of treatment even though you feel ill unless your doctor tells you to stop. In some cases, you may be given additional medicines to help  with side effects. Follow all directions for their use. Call your doctor or health care professional for advice if you get a fever, chills or sore throat, or other symptoms of a cold or flu. Do not treat yourself. This drug decreases your body's ability to fight infections. Try to avoid being around people who are sick. This medicine may increase your risk to bruise or bleed. Call your doctor or health care professional if you notice any unusual bleeding. Be careful brushing and flossing your teeth or using a toothpick because you may get an infection or bleed more easily. If you have any dental work done, tell your dentist you are receiving this medicine. Avoid taking products that contain aspirin, acetaminophen, ibuprofen, naproxen, or ketoprofen unless instructed by your doctor. These medicines may hide a fever. Do not become pregnant while taking this medicine. Women should inform their doctor if they wish to become pregnant or think they might be pregnant. There is a potential for serious side effects to an unborn child. Talk to your health care professional or pharmacist for more information. Do not breast-feed an infant while taking this medicine. Men should inform their doctor if they wish to father a child. This medicine may lower sperm counts. Do not treat diarrhea with over the counter products. Contact your doctor if you have diarrhea that lasts  more than 2 days or if it is severe and watery. This medicine can make you more sensitive to the sun. Keep out of the sun. If you cannot avoid being in the sun, wear protective clothing and use sunscreen. Do not use sun lamps or tanning beds/booths. What side effects may I notice from receiving this medicine? Side effects that you should report to your doctor or health care professional as soon as possible: -allergic reactions like skin rash, itching or hives, swelling of the face, lips, or tongue -low blood counts - this medicine may decrease the  number of white blood cells, red blood cells and platelets. You may be at increased risk for infections and bleeding. -signs of infection - fever or chills, cough, sore throat, pain or difficulty passing urine -signs of decreased platelets or bleeding - bruising, pinpoint red spots on the skin, black, tarry stools, blood in the urine -signs of decreased red blood cells - unusually weak or tired, fainting spells, lightheadedness -breathing problems -changes in vision -chest pain -mouth sores -nausea and vomiting -pain, swelling, redness at site where injected -pain, tingling, numbness in the hands or feet -redness, swelling, or sores on hands or feet -stomach pain -unusual bleeding Side effects that usually do not require medical attention (report to your doctor or health care professional if they continue or are bothersome): -changes in finger or toe nails -diarrhea -dry or itchy skin -hair loss -headache -loss of appetite -sensitivity of eyes to the light -stomach upset -unusually teary eyes This list may not describe all possible side effects. Call your doctor for medical advice about side effects. You may report side effects to FDA at 1-800-FDA-1088. Where should I keep my medicine? This drug is given in a hospital or clinic and will not be stored at home. NOTE: This sheet is a summary. It may not cover all possible information. If you have questions about this medicine, talk to your doctor, pharmacist, or health care provider.  2017 [employer] Standard (2007-12-25 13:53:16)

## 2016-08-01 ENCOUNTER — Encounter (HOSPITAL_COMMUNITY): Payer: Self-pay | Admitting: Internal Medicine

## 2016-08-01 ENCOUNTER — Ambulatory Visit: Payer: 59

## 2016-08-01 ENCOUNTER — Telehealth: Payer: Self-pay | Admitting: *Deleted

## 2016-08-01 ENCOUNTER — Other Ambulatory Visit: Payer: Self-pay | Admitting: *Deleted

## 2016-08-01 DIAGNOSIS — D649 Anemia, unspecified: Secondary | ICD-10-CM

## 2016-08-01 DIAGNOSIS — R0789 Other chest pain: Secondary | ICD-10-CM | POA: Diagnosis not present

## 2016-08-01 DIAGNOSIS — C155 Malignant neoplasm of lower third of esophagus: Secondary | ICD-10-CM | POA: Diagnosis not present

## 2016-08-01 DIAGNOSIS — R079 Chest pain, unspecified: Secondary | ICD-10-CM | POA: Diagnosis not present

## 2016-08-01 DIAGNOSIS — I10 Essential (primary) hypertension: Secondary | ICD-10-CM

## 2016-08-01 LAB — TROPONIN I
TROPONIN I: 0.03 ng/mL — AB (ref <0.03)
TROPONIN I: 0.03 ng/mL — AB (ref <0.03)
Troponin I: <0.03 ng/mL (ref <0.03)

## 2016-08-01 LAB — PREPARE RBC (CROSSMATCH)

## 2016-08-01 MED ORDER — FENTANYL 12 MCG/HR TD PT72: 12.0000 ug | MEDICATED_PATCH | TRANSDERMAL | Status: DC

## 2016-08-01 MED ORDER — ONDANSETRON HCL 4 MG/2ML IJ SOLN: 4.0000 mg | Freq: Four times a day (QID) | INTRAMUSCULAR | Status: DC | PRN

## 2016-08-01 MED ORDER — MORPHINE SULFATE (PF) 2 MG/ML IV SOLN
2.0000 mg | INTRAVENOUS | Status: DC | PRN
Start: 2016-08-01 — End: 2016-08-02
  Administered 2016-08-01 (×3): 2 mg via INTRAVENOUS
  Filled 2016-08-01 (×3): qty 1

## 2016-08-01 MED ORDER — SODIUM CHLORIDE 0.9% FLUSH: 10.0000 mL | INTRAVENOUS | Status: DC | PRN

## 2016-08-01 MED ORDER — FENTANYL 12 MCG/HR TD PT72
12.0000 ug | MEDICATED_PATCH | TRANSDERMAL | Status: DC
  Administered 2016-08-01: 12.5 ug via TRANSDERMAL

## 2016-08-01 MED ORDER — ACETAMINOPHEN 325 MG PO TABS
650.0000 mg | ORAL_TABLET | ORAL | Status: DC | PRN
  Administered 2016-08-01 – 2016-08-02 (×2): 650 mg via ORAL
  Filled 2016-08-01 (×2): qty 2

## 2016-08-01 MED ORDER — LORAZEPAM 1 MG PO TABS
1.0000 mg | ORAL_TABLET | Freq: Four times a day (QID) | ORAL | Status: DC | PRN
  Administered 2016-08-01: 1 mg via ORAL
  Filled 2016-08-01: qty 1

## 2016-08-01 MED ORDER — HEPARIN SOD (PORK) LOCK FLUSH 100 UNIT/ML IV SOLN
500.0000 [IU] | Freq: Every day | INTRAVENOUS | Status: DC | PRN
  Filled 2016-08-01: qty 5

## 2016-08-01 MED ORDER — ENOXAPARIN SODIUM 40 MG/0.4ML ~~LOC~~ SOLN
40.0000 mg | SUBCUTANEOUS | Status: DC
  Administered 2016-08-01: 40 mg via SUBCUTANEOUS
  Filled 2016-08-01: qty 0.4

## 2016-08-01 MED ORDER — ACETAMINOPHEN 325 MG PO TABS: 650.0000 mg | ORAL_TABLET | Freq: Once | ORAL | Status: DC

## 2016-08-01 MED ORDER — ACETAMINOPHEN 325 MG PO TABS: 650.0000 mg | ORAL_TABLET | Freq: Once | ORAL | Status: AC

## 2016-08-01 MED ORDER — SODIUM CHLORIDE 0.9% FLUSH
10.0000 mL | INTRAVENOUS | Status: AC | PRN
  Administered 2016-08-02: 10 mL

## 2016-08-01 MED ORDER — ACETAMINOPHEN 325 MG PO TABS
650.0000 mg | ORAL_TABLET | Freq: Once | ORAL | Status: AC
  Administered 2016-08-01: 650 mg
  Filled 2016-08-01 (×2): qty 2

## 2016-08-01 MED ORDER — DIPHENHYDRAMINE HCL 25 MG PO CAPS: 25.0000 mg | ORAL_CAPSULE | Freq: Once | ORAL | Status: AC

## 2016-08-01 MED ORDER — OSMOLITE 1.5 CAL PO LIQD
237.0000 mL | Freq: Four times a day (QID) | ORAL | Status: DC
  Administered 2016-08-01 – 2016-08-02 (×3): 237 mL
  Filled 2016-08-01 (×7): qty 237

## 2016-08-01 MED ORDER — SODIUM CHLORIDE 0.9 % IV SOLN
250.0000 mL | Freq: Once | INTRAVENOUS | Status: AC
  Administered 2016-08-01: 250 mL via INTRAVENOUS

## 2016-08-01 MED ORDER — HYDRALAZINE HCL 20 MG/ML IJ SOLN: 10.0000 mg | INTRAMUSCULAR | Status: DC | PRN

## 2016-08-01 MED ORDER — DIPHENHYDRAMINE HCL 50 MG/ML IJ SOLN
25.0000 mg | Freq: Once | INTRAMUSCULAR | Status: AC
  Administered 2016-08-01: 25 mg via INTRAVENOUS
  Filled 2016-08-01: qty 1

## 2016-08-01 MED ORDER — SODIUM CHLORIDE 0.9 % IV SOLN
250.0000 mL | Freq: Once | INTRAVENOUS | Status: AC
Start: 2016-08-01 — End: 2016-08-01
  Administered 2016-08-01: 250 mL via INTRAVENOUS

## 2016-08-01 MED ORDER — OSMOLITE 1.5 CAL PO LIQD
1000.0000 mL | ORAL | Status: DC
  Filled 2016-08-01: qty 1000

## 2016-08-01 MED ORDER — FAMOTIDINE IN NACL 20-0.9 MG/50ML-% IV SOLN
20.0000 mg | Freq: Two times a day (BID) | INTRAVENOUS | Status: DC
  Administered 2016-08-01 – 2016-08-02 (×3): 20 mg via INTRAVENOUS
  Filled 2016-08-01 (×4): qty 50

## 2016-08-01 MED ORDER — SODIUM CHLORIDE 0.9% FLUSH: 3.0000 mL | INTRAVENOUS | Status: DC | PRN

## 2016-08-01 MED ORDER — HEPARIN SOD (PORK) LOCK FLUSH 100 UNIT/ML IV SOLN
250.0000 [IU] | INTRAVENOUS | Status: DC | PRN
  Filled 2016-08-01: qty 3

## 2016-08-01 NOTE — H&P (Signed)
History and Physical    GAL FELDHAUS IAX:[id number] DOB: November 02, 1960 DOA: 07/31/2016  PCP: Donnajean Lopes, MD  Patient coming from: Home.  Chief Complaint: Chest pain.  HPI: Samuel Henderson is a 55 y.o. male with Metastatic esophageal cancer status post radiation therapy and presently on chemotherapy which was started yesterday presents with complaints of chest pain. 2 hours after chemotherapy infusion patient started developing burning sensation in the low part of his sternum. He denies any nausea vomiting abdominal pain shortness of breath. Chest x-ray in the ER shows cardiomegaly EKG shows nonspecific T-wave inversions. Troponin is mildly elevated. Patient is being admitted for further management of chest pain.  Patient has PEG tube and does not eat orally. Has not taken anything orally for last 3 weeks. Recently has stopped antihypertensives due to low normal blood pressure as requested by oncologist.  ED Course: Patient was given 1 L fluid bolus.  Review of Systems: As per HPI, rest all negative.   Past Medical History:  Diagnosis Date  . Arthritis   . Bone cancer ([place])   . Esophageal cancer ([place])   . History of chemotherapy   . History of radiation therapy   . Hypertension   . Knee pain     Past Surgical History:  Procedure Laterality Date  . ESOPHAGOGASTRODUODENOSCOPY (EGD) WITH ESOPHAGEAL DILATION     and biopsy  . IR GENERIC HISTORICAL  07/05/2016   IR GASTROSTOMY TUBE MOD SED 07/05/2016 [facility]  . IR GENERIC HISTORICAL  07/07/2016   IR RADIOLOGIST EVAL & MGMT 07/07/2016 [name], PA-C [facility]  . IR GENERIC HISTORICAL  07/20/2016   IR RADIOLOGIST EVAL & MGMT [facility]  . IRRIGATION AND DEBRIDEMENT KNEE Right 04/14/2016   Procedure: IRRIGATION AND DEBRIDEMENT RIGHT KNEE, ARTHROTOMY;  Surgeon: Paralee Cancel, MD;  Location: [facility];  Service: Orthopedics;  Laterality: Right;  . KNEE SURGERY    . LESION REMOVAL  1969   hip     reports that he has been smoking Cigarettes.  He has a 10.00 pack-year smoking history. He has never used smokeless tobacco. He reports that he uses drugs, including Marijuana. He reports that he does not drink alcohol.  No Known Allergies  Family History  Problem Relation Age of Onset  . Esophageal cancer Father   . Cancer Father     nasopharyngeal   . Diabetes Sister   . Cancer Sister     multiple myeloma  . Colon cancer Neg Hx     Prior to Admission medications   Medication Sig Start Date End Date Taking? Authorizing Provider  Calcium Carbonate-Vitamin D (CALCIUM 600+D PO) Take 1 tablet by mouth daily.   Yes Historical Provider, MD  docusate sodium (COLACE) 100 MG capsule Take 100 mg by mouth daily as needed for mild constipation.   Yes Historical Provider, MD  fentaNYL (DURAGESIC - DOSED MCG/HR) 12 MCG/HR Place 1 patch (12.5 mcg total) onto the skin every 3 (three) days. 07/20/16  Yes Brunetta Genera, MD  gabapentin (NEURONTIN) 100 MG capsule Take 100 mg by mouth 6 (six) times daily. 06/20/16  Yes Historical Provider, MD  lidocaine-prilocaine (EMLA) cream Apply 1 application topically once. 12/07/15 12/06/16 Yes Levant, MD  methocarbamol (ROBAXIN) 500 MG tablet Take 500 mg by mouth 4 (four) times daily. 06/23/16  Yes Historical Provider, MD  omeprazole (PRILOSEC) 20 MG capsule TAKE ONE CAPSULE BY MOUTH TWICE DAILY BEFORE A MEAL Patient taking differently: TAKE 28m BY MOUTH  TWICE DAILY BEFORE A MEAL when needed for acid reflux/ heartburn 02/17/16  Yes Gautam Juleen China, MD  ondansetron (ZOFRAN) 8 MG tablet Take 1 tablet (8 mg total) by mouth every 8 (eight) hours as needed for nausea or vomiting. 05/09/16  Yes Brunetta Genera, MD  oxyCODONE (ROXICODONE) 5 MG/5ML solution Take 5-10 mLs (5-10 mg total) by mouth every 4 (four) hours as needed for moderate pain or severe pain. 07/28/16  Yes Wyatt Portela, MD  senna-docusate (SENNA S) 8.6-50 MG tablet Take 2 tablets by  mouth at bedtime. 05/09/16  Yes Brunetta Genera, MD  VIAGRA 100 MG tablet Take 100 mg by mouth daily as needed for erectile dysfunction. Reported on 10/22/2015 09/10/15  Yes Historical Provider, MD  chlorhexidine (PERIDEX) 0.12 % solution Use as directed 15 mLs in the mouth or throat 2 (two) times daily. Swish and spit. Patient not taking: Reported on 07/31/2016 05/09/16   Brunetta Genera, MD  dexamethasone (DECADRON) 4 MG tablet Take 2 tablets (8 mg total) by mouth daily. Start the day after chemo for 2 days. Patient not taking: Reported on 07/31/2016 07/12/16   Brunetta Genera, MD  diclofenac (VOLTAREN) 75 MG EC tablet Take 75 mg by mouth 2 (two) times daily. 05/10/16   Historical Provider, MD  loperamide (IMODIUM A-D) 2 MG tablet Take 1 tablet (2 mg total) by mouth 4 (four) times daily as needed. Take 2 at diarrhea onset , then 1 every 2hr until 12hrs with no BM. Patient not taking: Reported on 07/31/2016 07/12/16   Brunetta Genera, MD  LORazepam (ATIVAN) 0.5 MG tablet TAKE 1 TABLET BY MOUTH EVERY 6 HOURS AS NEEDED FOR NAUSEA AND VOMITING Patient not taking: Reported on 07/31/2016 05/12/16   Brunetta Genera, MD  ondansetron (ZOFRAN) 8 MG tablet Take 1 tablet (8 mg total) by mouth 2 (two) times daily as needed for refractory nausea / vomiting. Start on day 3 after chemotherapy. Patient not taking: Reported on 07/31/2016 07/12/16   Brunetta Genera, MD  polyethylene glycol [facility] / Floria Raveling) packet Take 17 g by mouth daily. Patient not taking: Reported on 07/31/2016 05/09/16   Brunetta Genera, MD  prochlorperazine (COMPAZINE) 10 MG tablet Take 1 tablet (10 mg total) by mouth every 6 (six) hours as needed (NAUSEA). Patient not taking: Reported on 07/31/2016 07/12/16   Brunetta Genera, MD    Physical Exam: Vitals:   07/31/16 1960 07/31/16 2110 07/31/16 2155 07/31/16 2227  BP: 165/99 (!) 148/101 144/97   Pulse: 101 90 85   Resp: _0 Temp: 97.9 F (36.6 C)   97.7 F  (36.5 C)  TempSrc: Oral   Rectal  SpO2: 97% 100% 99%       Constitutional: Moderately built and nourished. Vitals:   07/31/16 1958 07/31/16 2110 07/31/16 2155 07/31/16 2227  BP: 165/99 (!) 148/101 144/97   Pulse: 101 90 85   Resp: _1 Temp: 97.9 F (36.6 C)   97.7 F (36.5 C)  TempSrc: Oral   Rectal  SpO2: 97% 100% 99%    Eyes: Anicteric no pallor. ENMT: No discharge from the ears eyes nose or mouth. Neck: No mass felt. No JVD appreciated. Respiratory: No rhonchi or crepitations. Cardiovascular: S1-S2 heard. No murmurs appreciated. Abdomen: PEG tube in place. No guarding or rigidity. Musculoskeletal: No edema. No joint effusion. Skin: No rash. Skin appears warm. Neurologic:Alert awake oriented to time place and person. Moves all extremities. Psychiatric:  Appears normal. Normal affect.   Labs on Admission: I have personally reviewed following labs and imaging studies  CBC:  Recent Labs Lab 07/31/16 1142 07/31/16 2104  WBC 10.7* 10.2  NEUTROABS 7.6* 9.1*  HGB 8.0* 8.1*  HCT 25.1* 24.8*  MCV 87.2 86.4  PLT 530* 003*   Basic Metabolic Panel:  Recent Labs Lab 07/31/16 1246 07/31/16 2104  NA 129* 130*  K 4.3 4.4  CL  --  90*  CO2 28 28  GLUCOSE 124 130*  BUN 7.2 9  CREATININE 0.5* 0.52*  CALCIUM 9.6 9.3   GFR: Estimated Creatinine Clearance: 74.8 mL/min (by C-G formula based on SCr of 0.52 mg/dL (L)). Liver Function Tests:  Recent Labs Lab 07/31/16 1246 07/31/16 2104  AST 57* 55*  ALT 17 19  ALKPHOS 387* 323*  BILITOT 0.33 1.0  PROT 7.4 8.1  ALBUMIN 2.8* 3.6   No results for input(s): LIPASE, AMYLASE in the last 168 hours. No results for input(s): AMMONIA in the last 168 hours. Coagulation Profile: No results for input(s): INR, PROTIME in the last 168 hours. Cardiac Enzymes:  Recent Labs Lab 07/31/16 2104  TROPONINI 0.03*   BNP (last 3 results) No results for input(s): PROBNP in the last 8760 hours. HbA1C: No results for  input(s): HGBA1C in the last 72 hours. CBG: No results for input(s): GLUCAP in the last 168 hours. Lipid Profile: No results for input(s): CHOL, HDL, LDLCALC, TRIG, CHOLHDL, LDLDIRECT in the last 72 hours. Thyroid Function Tests: No results for input(s): TSH, T4TOTAL, FREET4, T3FREE, THYROIDAB in the last 72 hours. Anemia Panel:  Recent Labs  07/31/16 1142  RETICCTPCT 3.47*   Urine analysis:    Component Value Date/Time   COLORURINE YELLOW 07/31/2016 2156   APPEARANCEUR CLEAR 07/31/2016 2156   LABSPEC 1.012 07/31/2016 2156   PHURINE 7.0 07/31/2016 2156   GLUCOSEU NEGATIVE 07/31/2016 2156   HGBUR NEGATIVE 07/31/2016 2156   [place] NEGATIVE 07/31/2016 2156   [place] NEGATIVE 07/31/2016 2156   PROTEINUR NEGATIVE 07/31/2016 2156   NITRITE NEGATIVE 07/31/2016 2156   LEUKOCYTESUR NEGATIVE 07/31/2016 2156   Sepsis Labs: _0 (procalcitonin:4,lacticidven:4) )No results found for this or any previous visit (from the past 240 hour(s)).   Radiological Exams on Admission: Dg Chest 2 View  Result Date: 07/31/2016 CLINICAL DATA:  Chemotherapy earlier today. Chest pain beginning a few hours ago. History of esophageal cancer. Smoker. EXAM: CHEST  2 VIEW COMPARISON:  05/13/2016 FINDINGS: Power port type right central venous catheter with tip over the low SVC region. No pneumothorax. Shallow inspiration with elevation of right hemidiaphragm. Linear atelectasis or scarring in the right lung base. No focal airspace disease or consolidation in the lungs. No blunting of costophrenic angles. Calcification of the aorta. Cardiac enlargement without vascular congestion. IMPRESSION: Shallow inspiration with elevation of right hemidiaphragm. Linear atelectasis in the right lung base. Cardiac enlargement. No focal consolidation or edema. Electronically Signed   By: Lucienne Capers M.D.   On: 07/31/2016 22:33    EKG: Independently reviewed. Normal sinus rhythm with nonspecific T-wave  changes.  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 Chest pain - EKG shows nonspecific T-wave changes with mildly elevated troponin. At this time we will cycle cardiac markers to rule out ACS and check 2-D echo. Since patient also has esophageal cancer, for now will keep patient nothing by mouth and if cardiac workup is  negative may have to rule out any esophageal issues. 2. Anemia - probably related to cancer, nutrition and chemotherapy. Follow CBC may need transfusion. Oncology was originally planning transfusion later this week. 3. Hypertension - patient has been off antihypertensives for last 2 weeks. Patient's blood pressure is elevated. I have placed patient on when necessary IV hydralazine. 4. Metastatic esophageal cancer status post radiation presenting on chemotherapy - per oncologist.   DVT prophylaxis: SCDs until 2-D echo results available. Code Status: Full code.  Family Communication: Patient's mother and brother.  Disposition Plan: Home.  Consults called: None.  Admission status: Observation.    Rise Patience MD [facility] Pager [phone].  If 7PM-7AM, please contact night-coverage [email or web address] Password [facility]  08/01/2016, 12:29 AM

## 2016-08-01 NOTE — Progress Notes (Addendum)
PROGRESS NOTE    Samuel Henderson  [id number] DOB: [birth year] DOA: 07/31/2016 PCP: Donnajean Lopes, MD   Brief Narrative: 55 year old male with metastatic esophageal cancer status post radiation therapy and was getting chemotherapy yesterday when he developed chest pain. The chest pain is intermittent. EKG with T-wave inversion, x-ray with cardiomegaly. Mildly elevated troponin level therefore patient was admitted early morning today.  Please see H&P from last night for detail. I saw patient in the ER today, he was waiting for bed.  Assessment & Plan:   #  Chest pain: Exact etiology unknown. He denied chest pain this morning. He was admitted for further evaluation of chest pain. Has borderline elevated troponin, nonspecific T-wave inversion in EKGAnd cardiomegaly in x-ray.  Continue to monitor in telemeter. Echo was ordered on admission. Patient's pain may also caused by esophageal disease, once cardiac cause rule out.  # Normocytic anemia: Hemoglobin around baseline. Currently on chemotherapy. Monitor CBC count. Follow-up with hematology oncology.  # HTN (hypertension): Monitor BP. Currently on hydralazine IV as needed.  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 Patient has a gastric tube. He reports only drinking liquid from the mouth. Dietary consult place. Start feeding from the gastric tube. Further management according chemotherapy and radiation therapy as per oncologist.  DVT prophylaxis: Lovenox subcutaneous and SCD. Code Status: Full code Family Communication: No family present at bedside Disposition Plan: Likely discharge home in 1-2 days  Consultants:   None  Procedures: None Antimicrobials: No  Subjective: Patient was seen and examined at ER. He was admitted last night, waiting bed upstair. Denied chest pain, shortness of breath, nausea, vomiting, headache or dizziness. Feels weak tired and  thirsty.   Objective: Vitals:   08/01/16 0853 08/01/16 1057 08/01/16 1339 08/01/16 1510  BP: (!) 154/102 149/100 (!) 162/105 (!) 152/95  Pulse: 89 101 88 99  Resp: 16 16 17 17   Temp: 98.8 F (37.1 C)   98.3 F (36.8 C)  TempSrc: Oral   Oral  SpO2: 99% 99% 99% 100%    Intake/Output Summary (Last 24 hours) at 08/01/16 1610 Last data filed at 08/01/16 [id number]  Gross per 24 hour  Intake              200 ml  Output                0 ml  Net              200 ml   There were no vitals filed for this visit.  Examination:  General exam: Appears calm and comfortable  Respiratory system: Clear to auscultation.  No wheezing or crackle. The port site has dressing on. Cardiovascular system: S1 & S2 heard, RRR.  No pedal edema. Gastrointestinal system: Abdomen is nondistended, soft and nontender. Normal bowel sounds heard.Gastric tube site clean. Central nervous system: Alert and oriented. No focal neurological deficits. Extremities: Symmetric 5 x 5 power. Skin: No rashes, lesions or ulcers Psychiatry: Judgement and insight appear normal. Mood & affect appropriate.     Data Reviewed: I have personally reviewed following labs and imaging studies  CBC:  Recent Labs Lab 07/31/16 1142 07/31/16 2104  WBC 10.7* 10.2  NEUTROABS 7.6* 9.1*  HGB 8.0* 8.1*  HCT 25.1* 24.8*  MCV 87.2 86.4  PLT 530* [id number]*   Basic Metabolic Panel:  Recent Labs Lab 07/31/16 1246 07/31/16 2104  NA 129* 130*  K 4.3 4.4  CL  --  90*  CO2 28 28  GLUCOSE 124 130*  BUN 7.2 9  CREATININE 0.5* 0.52*  CALCIUM 9.6 9.3   GFR: Estimated Creatinine Clearance: 74.8 mL/min (by C-G formula based on SCr of 0.52 mg/dL (L)). Liver Function Tests:  Recent Labs Lab 07/31/16 1246 07/31/16 2104  AST 57* 55*  ALT 17 19  ALKPHOS 387* 323*  BILITOT 0.33 1.0  PROT 7.4 8.1  ALBUMIN 2.8* 3.6   No results for input(s): LIPASE, AMYLASE in the last 168 hours. No results for input(s): AMMONIA in the last 168  hours. Coagulation Profile: No results for input(s): INR, PROTIME in the last 168 hours. Cardiac Enzymes:  Recent Labs Lab 07/31/16 2104 08/01/16 0055 08/01/16 0554 08/01/16 1245  TROPONINI 0.03* <0.03 0.03* 0.03*   BNP (last 3 results) No results for input(s): PROBNP in the last 8760 hours. HbA1C: No results for input(s): HGBA1C in the last 72 hours. CBG: No results for input(s): GLUCAP in the last 168 hours. Lipid Profile: No results for input(s): CHOL, HDL, LDLCALC, TRIG, CHOLHDL, LDLDIRECT in the last 72 hours. Thyroid Function Tests: No results for input(s): TSH, T4TOTAL, FREET4, T3FREE, THYROIDAB in the last 72 hours. Anemia Panel:  Recent Labs  07/31/16 1142  RETICCTPCT 3.47*   Sepsis Labs:  Recent Labs Lab 07/31/16 2117  LATICACIDVEN 1.46    No results found for this or any previous visit (from the past 240 hour(s)).       Radiology Studies: Dg Chest 2 View  Result Date: 07/31/2016 CLINICAL DATA:  Chemotherapy earlier today. Chest pain beginning a few hours ago. History of esophageal cancer. Smoker. EXAM: CHEST  2 VIEW COMPARISON:  05/13/2016 FINDINGS: Power port type right central venous catheter with tip over the low SVC region. No pneumothorax. Shallow inspiration with elevation of right hemidiaphragm. Linear atelectasis or scarring in the right lung base. No focal airspace disease or consolidation in the lungs. No blunting of costophrenic angles. Calcification of the aorta. Cardiac enlargement without vascular congestion. IMPRESSION: Shallow inspiration with elevation of right hemidiaphragm. Linear atelectasis in the right lung base. Cardiac enlargement. No focal consolidation or edema. Electronically Signed   By: Lucienne Capers M.D.   On: 07/31/2016 22:33        Scheduled Meds: . famotidine (PEPCID) IV  20 mg Intravenous Q12H  . feeding supplement (OSMOLITE 1.5 CAL)  237 mL Per Tube QID  . fentaNYL  12.5 mcg Transdermal Q72H   Continuous  Infusions:   LOS: 0 days    Valerian Jewel Tanna Furry, MD [facility] Pager [phone]  If 7PM-7AM, please contact night-coverage [email or web address] Password [email or web address] 08/01/2016, 4:10 PM

## 2016-08-01 NOTE — Progress Notes (Signed)
[facility] staff made aware of need to be followed for d/c needs -admission as observation for chest pain

## 2016-08-01 NOTE — ED Notes (Signed)
Pt fed via GT. GT flushed.

## 2016-08-01 NOTE — Telephone Encounter (Signed)
Pt noted to be admitted when RN attempted to make chemo f/u call.

## 2016-08-01 NOTE — Telephone Encounter (Signed)
-----   Message from Egbert Garibaldi, RN sent at 07/31/2016  4:50 PM EST ----- Regarding: Dr. Irene Limbo chemo follow up call  Pt of Dr. Irene Limbo. First time Irinotecan, Leucovorin and Adrucil. Pt tolerated infusion well.

## 2016-08-01 NOTE — Progress Notes (Signed)
Patient's continuous chemo infusion discontinued per Dr Grier Mitts order.  Patient PAC flushed and blood return noted.

## 2016-08-02 ENCOUNTER — Other Ambulatory Visit: Payer: 59

## 2016-08-02 ENCOUNTER — Observation Stay (HOSPITAL_BASED_OUTPATIENT_CLINIC_OR_DEPARTMENT_OTHER): Payer: 59

## 2016-08-02 DIAGNOSIS — R0789 Other chest pain: Secondary | ICD-10-CM

## 2016-08-02 DIAGNOSIS — D649 Anemia, unspecified: Secondary | ICD-10-CM

## 2016-08-02 DIAGNOSIS — Z931 Gastrostomy status: Secondary | ICD-10-CM | POA: Diagnosis not present

## 2016-08-02 DIAGNOSIS — C159 Malignant neoplasm of esophagus, unspecified: Secondary | ICD-10-CM

## 2016-08-02 DIAGNOSIS — R079 Chest pain, unspecified: Secondary | ICD-10-CM

## 2016-08-02 LAB — ECHOCARDIOGRAM COMPLETE: HEIGHTINCHES: 69 in

## 2016-08-02 MED ORDER — OSMOLITE 1.5 CAL PO LIQD
237.0000 mL | Freq: Every day | ORAL | Status: DC
  Administered 2016-08-02: 237 mL
  Filled 2016-08-02 (×4): qty 237

## 2016-08-02 MED ORDER — FREE WATER
200.0000 mL | Freq: Every day | Status: DC
  Administered 2016-08-02: 200 mL

## 2016-08-02 MED ORDER — HEPARIN SOD (PORK) LOCK FLUSH 100 UNIT/ML IV SOLN
500.0000 [IU] | Freq: Once | INTRAVENOUS | Status: DC
  Filled 2016-08-02: qty 5

## 2016-08-02 MED ORDER — HYDRALAZINE HCL 20 MG/ML IJ SOLN
5.0000 mg | Freq: Once | INTRAMUSCULAR | Status: AC
  Administered 2016-08-02: 5 mg via INTRAVENOUS
  Filled 2016-08-02: qty 1

## 2016-08-02 MED ORDER — AMLODIPINE BESYLATE 5 MG PO TABS: 5.0000 mg | ORAL_TABLET | Freq: Every day | ORAL | 0 refills | Status: DC

## 2016-08-02 MED ORDER — HEPARIN SOD (PORK) LOCK FLUSH 100 UNIT/ML IV SOLN
500.0000 [IU] | INTRAVENOUS | Status: AC | PRN
  Administered 2016-08-02: 500 [IU]

## 2016-08-02 MED ORDER — FREE WATER: 200.0000 mL | Freq: Every day | 0 refills | Status: AC

## 2016-08-02 MED ORDER — SODIUM CHLORIDE 0.9% FLUSH: 10.0000 mL | INTRAVENOUS | Status: DC | PRN

## 2016-08-02 MED FILL — AMLODIPINE BESYLATE 5 MG TA: 5 | 30 days supply | Qty: 30 | Fill #0

## 2016-08-02 NOTE — Discharge Summary (Signed)
Discharge Summary  EMMETTE Henderson [id number] DOB: [birth year]  PCP: Donnajean Lopes, MD  Admit date: 07/31/2016 Discharge date: 08/02/2016  Time spent: <27mins  Recommendations for Outpatient Follow-up:  1. F/u with oncology Dr Irene Limbo on 12/4 2. PMD within a week  for hospital discharge follow up  Discharge Diagnoses:  Active Hospital Problems   Diagnosis Date Noted  . Chest pain 07/31/2016  . Chest discomfort   . Symptomatic anemia   . 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 HTN (hypertension) 09/02/2015    Resolved Hospital Problems   Diagnosis Date Noted Date Resolved  No resolved problems to display.    Discharge Condition: stable  Diet recommendation: nutrition per peg tube  There were no vitals filed for this visit.  History of present illness:  PCP: Donnajean Lopes, MD  Patient coming from: Home.  Chief Complaint: Chest pain.  HPI: Samuel Henderson is a 55 y.o. male with Metastatic esophageal cancer status post radiation therapy and presently on chemotherapy which was started yesterday presents with complaints of chest pain. 2 hours after chemotherapy infusion patient started developing burning sensation in the low part of his sternum. He denies any nausea vomiting abdominal pain shortness of breath. Chest x-ray in the ER shows cardiomegaly EKG shows nonspecific T-wave inversions. Troponin is mildly elevated. Patient is being admitted for further management of chest pain.  Patient has PEG tube and does not eat orally. Has not taken anything orally for last 3 weeks. Recently has stopped antihypertensives due to low normal blood pressure as requested by oncologist.  ED Course: Patient was given 1 L fluid bolus.  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 Chest pain and  diaphoresis - happened while receiving chemo with first cycle of irinotecan and 64fu, EKG shows nonspecific T-wave changes with mildly elevated troponin. Troponin negative x3, echo with normal lvef, no wall motion abnormality. Symptom has resolved,        per oncology chest pain could be from coronary vasospasm from 61fu, diaphoresis could be from cholinergic effect from irrinotecan 2. Anemia in maliganancy- probably related to cancer, nutrition and chemotherapy. S/p 2units of prbc on 11/28. 3. Hypertension - patient has been off antihypertensives for last 2 weeks ( he was on novasc and lisinopril). Patient's blood pressure is elevated. Restart home meds norvasc. 4. 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  Family Communication: Patient's significant other in room  Disposition Plan: Home.  Consults called: oncology Dr Irene Limbo   Procedures:  PRBC transusion x2 unit on 11/18   Discharge Exam: BP (!) 149/95 (BP Location: Right Arm)   Pulse 91   Temp 98.5 F (36.9 C) (Oral)   Resp 18   Ht 5\' 9"  (1.753 m)   SpO2 100%   General: thin, but NAD Cardiovascular: RRR, chest port in place Respiratory: CTABL Abdomen: peg exit site clean   Discharge Instructions You were cared for by a hospitalist during your hospital stay. If you have any questions about your discharge medications or the  care you received while you were in the hospital after you are discharged, you can call the unit and asked to speak with the hospitalist on call if the hospitalist that took care of you is not available. Once you are discharged, your primary care physician will handle any further medical issues. Please note that NO REFILLS for any discharge medications will be  authorized once you are discharged, as it is imperative that you return to your primary care physician (or establish a relationship with a primary care physician if you do not have one) for your aftercare needs so that they can reassess your need for medications and monitor your lab values.  Discharge Instructions    Type and screen    Complete by:  Aug 01, 2016    Glenn order/instruction    Complete by:  As directed    Transfuse Parameters   Complete patient signature process for consent form    Complete by:  As directed    Discharge instructions    Complete by:  As directed    Continue nutrition per tube   Increase activity slowly    Complete by:  As directed    Practitioner attestation of consent    Complete by:  As directed    I, the ordering practitioner, attest that I have discussed with the patient the benefits, risks, side effects, alternatives, likelihood of achieving goals and potential problems during recovery for the procedure listed.   Procedure:  Blood Product(s)       Medication List    STOP taking these medications   chlorhexidine 0.12 % solution Commonly known as:  PERIDEX   dexamethasone 4 MG tablet Commonly known as:  DECADRON   diclofenac 75 MG EC tablet Commonly known as:  VOLTAREN   loperamide 2 MG tablet Commonly known as:  IMODIUM A-D   LORazepam 0.5 MG tablet Commonly known as:  ATIVAN   methocarbamol 500 MG tablet Commonly known as:  ROBAXIN   omeprazole 20 MG capsule Commonly known as:  PRILOSEC   polyethylene glycol packet Commonly known as:  MIRALAX / GLYCOLAX   prochlorperazine 10 MG tablet Commonly known as:  COMPAZINE     TAKE these medications   amLODipine 5 MG tablet Commonly known as:  NORVASC Take 1 tablet (5 mg total) by mouth daily.   CALCIUM 600+D PO Take 1 tablet by mouth daily.   docusate sodium 100 MG capsule Commonly known as:  COLACE Take 100 mg by mouth daily as needed for  mild constipation.   feeding supplement (OSMOLITE 1.5 CAL) Liqd Place 1,000 mLs into feeding tube 4 (four) times daily.   fentaNYL 12 MCG/HR Commonly known as:  DURAGESIC - dosed mcg/hr Place 1 patch (12.5 mcg total) onto the skin every 3 (three) days.   free water Soln Place 200 mLs into feeding tube 5 (five) times daily.   gabapentin 100 MG capsule Commonly known as:  NEURONTIN Take 100 mg by mouth 6 (six) times daily.   lidocaine-prilocaine cream Commonly known as:  EMLA Apply 1 application topically once.   ondansetron 8 MG tablet Commonly known as:  ZOFRAN Take 1 tablet (8 mg total) by mouth every 8 (eight) hours as needed for nausea or vomiting. What changed:  Another medication with the same name was removed. Continue taking this medication, and follow the directions you see here.   oxyCODONE 5 MG/5ML solution Commonly known as:  ROXICODONE Take 5-10 mLs (5-10  mg total) by mouth every 4 (four) hours as needed for moderate pain or severe pain.   senna-docusate 8.6-50 MG tablet Commonly known as:  SENNA S Take 2 tablets by mouth at bedtime.   VIAGRA 100 MG tablet Generic drug:  sildenafil Take 100 mg by mouth daily as needed for erectile dysfunction. Reported on 10/22/2015      No Known Allergies    The results of significant diagnostics from this hospitalization (including imaging, microbiology, ancillary and laboratory) are listed below for reference.    Significant Diagnostic Studies: Dg Chest 2 View  Result Date: 07/31/2016 CLINICAL DATA:  Chemotherapy earlier today. Chest pain beginning a few hours ago. History of esophageal cancer. Smoker. EXAM: CHEST  2 VIEW COMPARISON:  05/13/2016 FINDINGS: Power port type right central venous catheter with tip over the low SVC region. No pneumothorax. Shallow inspiration with elevation of right hemidiaphragm. Linear atelectasis or scarring in the right lung base. No focal airspace disease or consolidation in the lungs. No  blunting of costophrenic angles. Calcification of the aorta. Cardiac enlargement without vascular congestion. IMPRESSION: Shallow inspiration with elevation of right hemidiaphragm. Linear atelectasis in the right lung base. Cardiac enlargement. No focal consolidation or edema. Electronically Signed   By: Lucienne Capers M.D.   On: 07/31/2016 22:33   Ir Gastrostomy Tube Mod Sed  Result Date: 07/05/2016 INDICATION: Esophageal cancer EXAM: PERC PLACEMENT GASTROSTOMY MEDICATIONS: Ancef 2 g; Antibiotics were administered within 1 hour of the procedure. Glucagon 1 mg IV. ANESTHESIA/SEDATION: Versed 6 mg IV; Fentanyl 300 mcg IV Moderate Sedation Time:  40 The patient was continuously monitored during the procedure by the interventional radiology nurse under my direct supervision. CONTRAST:  9mL ISOVUE-300 IOPAMIDOL (ISOVUE-300) INJECTION 61% - administered into the gastric lumen. FLUOROSCOPY TIME:  Fluoroscopy Time: 8 minutes 30 seconds (57 mGy). COMPLICATIONS: None immediate. PROCEDURE: The procedure, risks, benefits, and alternatives were explained to the patient. Questions regarding the procedure were encouraged and answered. The patient understands and consents to the procedure. The epigastrium was prepped with Betadine in a sterile fashion, and a sterile drape was applied covering the operative field. A sterile gown and sterile gloves were used for the procedure. A 5-French orogastric tube is placed under fluoroscopic guidance. Scout imaging of the abdomen confirms barium within the transverse colon. The stomach was distended with gas. Under fluoroscopic guidance, an 18 gauge needle was utilized to puncture the anterior wall of the body of the stomach. An Amplatz wire was advanced through the needle passing a T fastener into the lumen of the stomach. 24 T fasteners were secured for gastropexy. The 18 gauge needle was then advanced into the lumen of the stomach and removed over an Amplatz wire. The graduated  18 French peel-away sheath was then advanced over the Amplatz. An 89 French gastrostomy tube was advanced over the wire and through the peel-away sheath. The balloon was insufflated with 10 cc saline and secured in place. Contrast was injected. Findings The image demonstrates placement of a 92 French balloon retention type gastrostomy tube into the body of the stomach. IMPRESSION: Successful 18 French balloon retention gastrostomy. Electronically Signed   By: Marybelle Killings M.D.   On: 07/05/2016 15:31   Ir Radiologist Eval & Mgmt  Result Date: 07/20/2016 Ascencion Dike, PA-C     07/20/2016 12:55 PM Pt returns for removal of T-fasteners. S/p Perc G-tube on 11/1 Has been doing well. 3 T-fasteners removed without difficulty. Bumper is snug, no leak. Follow up prn. Ascencion Dike  PA-C Interventional Radiology 07/20/2016 12:54 PM   Ir Radiologist Eval & Mgmt  Result Date: 07/10/2016 Mr Canuto is here today for recheck of his recently placed gastrostomy tube.  It was oozing post-operatively and QuickClot was placed around the tube. I removed this today without difficult, examined the site = looks good, no bleeding) and re-dressed the g-tube with 4x4 drain sponges and Hypafix tape.  I also instructed his wife how to flush the tube.  WENDY S BLAIR PA-C 07/07/2016 2:53 PM     Microbiology: No results found for this or any previous visit (from the past 240 hour(s)).   Labs: Basic Metabolic Panel:  Recent Labs Lab 07/31/16 1246 07/31/16 2104  NA 129* 130*  K 4.3 4.4  CL  --  90*  CO2 28 28  GLUCOSE 124 130*  BUN 7.2 9  CREATININE 0.5* 0.52*  CALCIUM 9.6 9.3   Liver Function Tests:  Recent Labs Lab 07/31/16 1246 07/31/16 2104  AST 57* 55*  ALT 17 19  ALKPHOS 387* 323*  BILITOT 0.33 1.0  PROT 7.4 8.1  ALBUMIN 2.8* 3.6   No results for input(s): LIPASE, AMYLASE in the last 168 hours. No results for input(s): AMMONIA in the last 168 hours. CBC:  Recent Labs Lab 07/31/16 1142  07/31/16 2104  WBC 10.7* 10.2  NEUTROABS 7.6* 9.1*  HGB 8.0* 8.1*  HCT 25.1* 24.8*  MCV 87.2 86.4  PLT 530* 597*   Cardiac Enzymes:  Recent Labs Lab 07/31/16 2104 08/01/16 0055 08/01/16 0554 08/01/16 1245  TROPONINI 0.03* <0.03 0.03* 0.03*   BNP: BNP (last 3 results) No results for input(s): BNP in the last 8760 hours.  ProBNP (last 3 results) No results for input(s): PROBNP in the last 8760 hours.  CBG: No results for input(s): GLUCAP in the last 168 hours.     SignedFlorencia Reasons MD, PhD  [facility] 08/02/2016, 3:23 PM

## 2016-08-02 NOTE — Progress Notes (Signed)
Initial Nutrition Assessment  DOCUMENTATION CODES:   Severe malnutrition in context of chronic illness, Underweight  INTERVENTION:  Provided patient with Ensure coupons and encouraged him to sip on Ensure Plus daily at home as per outpatient RD's recommendations.  Updated TF regimen as follows: Provide one can Osmolite 1.5 five times daily. This regimen provides 1775 kcal, 75 grams protein, 904 ml H2O. Goal regimen for patient eventually is one can Osmolite 1.5 six times daily, but patient does not want to provide six cans today.  Recommend free water flush of 100 ml before and after feeds 5 times daily. This provides 1 L additional free water.  NUTRITION DIAGNOSIS:   Malnutrition (Severe) related to chronic illness, cancer and cancer related treatments as evidenced by 20 percent weight loss over 1 year, severe depletion of muscle mass, severe depletion of body fat.  GOAL:   Patient will meet greater than or equal to 90% of their needs  MONITOR:   TF tolerance, PO intake, I & O's, Labs, Weight trends  REASON FOR ASSESSMENT:   Consult Enteral/tube feeding initiation and management  ASSESSMENT:   55 y.o. 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 Two hours after chemotherapy infusion patient started developing burning sensation in the low part of his sternum.   -Patient has been followed by outpatient RD at cancer center regularly since 09/2015. As patient has still been losing weight, last plan as of 11/27 was to increase TF regimen to Osmolite 1.5 to goal of 6 cans daily. Also encouraged patient to sip on Ensure Plus throughout the day.  Spoke with patient at bedside. Confirmed that current regimen is Osmolite 1.5 cans QID with 120 ml H2O before and  after feeds QID. Patient also reports sipping on water throughout the day. Patient is concerned he will not be able to tolerate 6 cans daily. Discussed importance of adequate calories and protein to help meet his body's increased needs at this time so he can prevent further weight loss and stay strong. Patient is amenable to advancing to 5 cans per day to start with during this admission.   Reports UBW was 137-142 lbs and he began losing weight 08/2015. Weight history in chart does not start until 10/2015. Per patient, he has lost 28 lbs (20% body weight) over 1 year, which is significant for time frame.  Medications reviewed and include: famotidine.  Labs reviewed (from 11/27): Sodium 130, Chloride 90, Creatinine 0.52, Glucose 130.   Nutrition-Focused physical exam completed. Findings are severe fat depletion, severe muscle depletion, and no edema.   Patient meets criteria for severe chronic malnutrition in setting of 20% weight loss over 1 year, severe fat depletion, severe muscle depletion.  Discussed with RN.   Diet Order:     Skin:  Reviewed, no issues  Last BM:  08/01/2016  Height:   Ht Readings from Last 1 Encounters:  08/01/16 5\' 9"  (1.753 m)    Weight:   Wt Readings from Last 1 Encounters:  07/25/16 114 lb 6.4 oz (51.9 kg)    Ideal Body Weight:  72.73 kg  BMI:  There is no height or weight on file to calculate BMI. BMI 16.89 per weight on 11/21  Estimated Nutritional Needs:   Kcal:  1900-2100 (36-40 kcal/kg)  Protein:  80-90 grams (1.5-1.7 grams/kg)  Fluid:  2 L/day  EDUCATION NEEDS:   Education needs addressed (Importance of adequate calories and protein - patient resistant to increase TF regimen as recommended.)  Samuel Blade, MS, RD, LDN Pager: [phone] After Hours Pager: [phone]

## 2016-08-02 NOTE — Progress Notes (Signed)
  Echocardiogram 2D Echocardiogram has been performed.  Jennette Dubin 08/02/2016, 2:15 PM

## 2016-08-02 NOTE — Progress Notes (Signed)
Went over d/c instructions with patient.  Patient verbalized understanding.  He left hospital via personal vehicle with his friend, Santiago Glad.

## 2016-08-02 NOTE — Progress Notes (Signed)
[name] Kitchen    HEMATOLOGY/ONCOLOGY CLINIC NOTE  Date of Service: .07/20/2016    Patient Care Team: Leanna Battles, MD as PCP - General (Internal Medicine)  CHIEF COMPLAINTS/PURPOSE OF CONSULTATION:  Metastatic Esophageal Squamous cell carcinoma   HISTORY OF PRESENTING ILLNESS: plz see my initial consultation regarding patients initial presentation  INTERVAL HISTORY  Mr Hosick is here for his scheduled follow-up . He notes has been increasing his tube feeding gradually. Notes pain around the feeding tube and in the lower chest. Also some neuropathic discomfort in his upper extremities. We decided to start him on a fentanyl patch and he could continue to use when necessary oxycodone for breakthrough pain. We again discussed goals of care is willing to proceed with palliative FOLFIRI chemotherapy. No hemoptysis no overt GI bleeding. Patient notes significant fatigue has anemia in the mid 8 range on the hemoglobin. Offered PRBC transfusion but refuses at this time. He wants to hold off until after Thanksgiving for his chemotherapy.  MEDICAL HISTORY:  Past Medical History:  Diagnosis Date  . Arthritis   . Bone cancer ([place])   . Esophageal cancer ([place])   . History of chemotherapy   . History of radiation therapy   . Hypertension   . Knee pain     SURGICAL HISTORY: Past Surgical History:  Procedure Laterality Date  . ESOPHAGOGASTRODUODENOSCOPY (EGD) WITH ESOPHAGEAL DILATION     and biopsy  . IR GENERIC HISTORICAL  07/05/2016   IR GASTROSTOMY TUBE MOD SED 07/05/2016 [facility]  . IR GENERIC HISTORICAL  07/07/2016   IR RADIOLOGIST EVAL & MGMT 07/07/2016 [name], PA-C [facility]  . IR GENERIC HISTORICAL  07/20/2016   IR RADIOLOGIST EVAL & MGMT [facility]  . IRRIGATION AND DEBRIDEMENT KNEE Right 04/14/2016   Procedure: IRRIGATION AND DEBRIDEMENT RIGHT KNEE, ARTHROTOMY;  Surgeon: Paralee Cancel, MD;  Location: [facility];  Service: Orthopedics;  Laterality: Right;  . KNEE  SURGERY    . LESION REMOVAL  1969   hip    SOCIAL HISTORY: Social History   Social History  . Marital status: Single    Spouse name: N/A  . Number of children: 2  . Years of education: N/A   Occupational History  . Manufacturing industrial curtains    Social History Main Topics  . Smoking status: Current Every Day Smoker    Packs/day: 0.50    Years: 20.00    Types: Cigarettes  . Smokeless tobacco: Never Used  . Alcohol use No  . Drug use:     Types: Marijuana     Comment: last smoked 1 week ago  . Sexual activity: Yes   Other Topics Concern  . 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 Sober for 15 yrs  Ex smoker 1/2 PPD x 61yr quit about 18 yrs ago.  FAMILY HISTORY: Family History  Problem Relation Age of Onset  . Esophageal cancer Father   . Cancer Father     nasopharyngeal   . Diabetes Sister   . Cancer Sister     multiple myeloma  . Colon cancer Neg Hx     ALLERGIES:  has No Known Allergies.  MEDICATIONS:  No current  facility-administered medications for this visit.    No current outpatient prescriptions on file.   Facility-Administered Medications Ordered in Other Visits  Medication Dose Route Frequency Provider Last Rate Last Dose  . acetaminophen (TYLENOL) tablet 650 mg  650 mg Oral Q4H PRN Rise Patience, MD   650 mg at 08/02/16 1056  . enoxaparin (LOVENOX) injection 40 mg  40 mg Subcutaneous Q24H Dron Tanna Furry, MD   40 mg at 08/01/16 1824  . famotidine (PEPCID) IVPB 20 mg premix  20 mg Intravenous Q12H Rise Patience, MD 100 mL/hr at 08/02/16 0950 20 mg at 08/02/16 0950  . feeding supplement (OSMOLITE 1.5 CAL) liquid 237 mL  237 mL Per Tube 5 X Daily Florencia Reasons, MD      . fentaNYL  ([place] - dosed mcg/hr) 12.5 mcg  12.5 mcg Transdermal Q72H Rise Patience, MD   12.5 mcg at 08/01/16 0750  . free water 200 mL  200 mL Per Tube 5 X Daily Florencia Reasons, MD      . heparin lock flush 100 unit/mL  500 Units Intracatheter Daily PRN Brunetta Genera, MD      . heparin lock flush 100 unit/mL  250 Units Intracatheter PRN Brunetta Genera, MD      . heparin lock flush 100 unit/mL  500 Units Intracatheter Daily PRN Brunetta Genera, MD      . heparin lock flush 100 unit/mL  250 Units Intracatheter PRN Brunetta Genera, MD      . hydrALAZINE (APRESOLINE) injection 10 mg  10 mg Intravenous Q4H PRN Rise Patience, MD      . LORazepam (ATIVAN) tablet 1 mg  1 mg Oral Q6H PRN Dron Tanna Furry, MD   1 mg at 08/01/16 2303  . morphine 2 MG/ML injection 2 mg  2 mg Intravenous Q2H PRN Rise Patience, MD   2 mg at 08/01/16 2116  . ondansetron (ZOFRAN) injection 4 mg  4 mg Intravenous Q6H PRN Rise Patience, MD      . sodium chloride flush (NS) 0.9 % injection 10 mL  10 mL Intracatheter PRN Brunetta Genera, MD      . sodium chloride flush (NS) 0.9 % injection 10 mL  10 mL Intracatheter PRN Brunetta Genera, MD      . sodium chloride flush (NS) 0.9 % injection 10-40 mL  10-40 mL Intracatheter PRN Florencia Reasons, MD      . sodium chloride flush (NS) 0.9 % injection 3 mL  3 mL Intracatheter PRN Brunetta Genera, MD      . sodium chloride flush (NS) 0.9 % injection 3 mL  3 mL Intracatheter PRN Ravanna, MD        REVIEW OF SYSTEMS:    10 Point review of Systems was done is negative except as noted above.  PHYSICAL EXAMINATION: ECOG PERFORMANCE STATUS: 2  . Vitals:   07/20/16 1059  BP: 136/80  Pulse: 96  Resp: 18  Temp: 98.3 F (36.8 C)   Filed Weights   07/20/16 1059  Weight: 117 lb 1 oz (53.1 kg)   .Body mass index is 17.29 kg/m.  [name] Kitchen GENERAL:middle aged AAM ,alert, in no acute distress and comfortable SKIN: skin color, texture, turgor  are normal, no rashes or significant lesions EYES: normal, conjunctiva are pink and non-injected, sclera clear OROPHARYNX:no exudate, no erythema and lips, buccal mucosa, and tongue normal  NECK: supple, no JVD, thyroid normal size, non-tender, without  nodularity LYMPH:  no palpable lymphadenopathy in the cervical, axillary or inguinal LUNGS: clear to auscultation with normal respiratory effort HEART: regular rate & rhythm,  no murmurs and no lower extremity edema ABDOMEN: abdomen soft, non-tender, normoactive bowel sounds  Musculoskeletal: no cyanosis of digits and no clubbing  PSYCH: alert & oriented x 3 with fluent speech NEURO: no focal motor/sensory deficits  LABORATORY DATA:  I have reviewed the data as listed  Component     Latest Ref Rng & Units 07/13/2016          WBC     4.0 - 10.3 10e3/uL 12.5 (H)  NEUT#     1.5 - 6.5 10e3/uL 9.2 (H)  Hemoglobin     13.0 - 17.1 g/dL 8.7 (L)  HCT     38.4 - 49.9 % 27.2 (L)  Platelets     140 - 400 10e3/uL 673 (H)  MCV     79.3 - 98.0 fL 91.3  MCH     27.2 - 33.4 pg 29.2  MCHC     32.0 - 36.0 g/dL 32.0  RBC     4.20 - 5.82 10e6/uL 2.98 (L)  RDW     11.0 - 14.6 % 16.5 (H)  lymph#     0.9 - 3.3 10e3/uL 1.7  MONO#     0.1 - 0.9 10e3/uL 1.4 (H)  Eosinophils Absolute     0.0 - 0.5 10e3/uL 0.1  Basophils Absolute     0.0 - 0.1 10e3/uL 0.0  NEUT%     39.0 - 75.0 % 73.4  LYMPH%     14.0 - 49.0 % 13.9 (L)  MONO%     0.0 - 14.0 % 11.5  EOS%     0.0 - 7.0 % 1.0  BASO%     0.0 - 2.0 % 0.2  Sodium     136 - 145 mEq/L 134 (L)  Potassium     3.5 - 5.1 mEq/L 4.2  Chloride     98 - 109 mEq/L 94 (L)  CO2     22 - 29 mEq/L 28  Glucose     70 - 140 mg/dl 106  BUN     7.0 - 26.0 mg/dL 10.6  Creatinine     0.7 - 1.3 mg/dL 0.6 (L)  Total Bilirubin     0.20 - 1.20 mg/dL 0.23  Alkaline Phosphatase     40 - 150 U/L 237 (H)  AST     5 - 34 U/L 41 (H)  ALT     0 - 55 U/L 19  Total Protein     6.4 - 8.3 g/dL 7.4  Albumin      3.5 - 5.0 g/dL 2.9 (L)  Calcium     8.4 - 10.4 mg/dL 9.9  Anion gap     3 - 11 mEq/L 12 (H)  EGFR     >90 ml/min/1.73 m2 >90  ABO/RH(D)        Antibody Screen        Sample Expiration        Unit Number        Blood Component Type        Unit division        Status of Unit        Transfusion Status        Crossmatch Result        Magnesium     1.5 - 2.5 mg/dl 1.7  Phosphorus  2.5 - 4.5 mg/dL 2.7         Radiology  CT Chest/abd/pelvis 06/05/2016  IMPRESSION: 1. Significant interval progression of liver metastases. 2. New abnormal thickening of the esophagus which may reflect local tumor recurrence. 3. Stable lytic lesion involving the T1 vertebra and sclerotic lesion involving the L3 vertebra. 4. Similar appearance of gastrohepatic ligament lymph node.   Electronically Signed   By: Kerby Moors M.D.   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 S/p palliative radiation therapy to the esophagus and regional lymph nodes. He has tolerated treatment generally well except for some mild grade 2 radiation esophagitis which has now resolved. Repeat CT chest abdomen pelvis done 11/17/2015 show progression of his liver metastases and gastrohepatic lymph node as expected.  He has completed his 6 cycles of carboplatin and Taxol  With grade 1-2 fatigue and grade 1 neuropathy CT c/a/p with stable improved disease with some  T1 metastasis, as before. Additional lesions in the L3 vertebral body and right sacrum, corresponding to abnormalities on 04/06/2016 and worrisome for metastatic disease.  CT CAP 06/05/2016 -- significant progression of liver mets and concern for new abnormal thickening of the esophagus -likely reflecting local tumor  recurrence. 2) HTN- patient notes his appetite has improved and he is eating better. On amlodipine. 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 EGD done 06/26/2016 confirms local recurrence of SCC with PDL1 expression 0% 4) grade 2 radiation esophagitis. Resolved . 5) leukocytosis likely due to Neulasta. No evidence of infection at this time. 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 This has improved some and remains nonpainful. 8) neoplasm related pain especially from bone metastases in his lower back - controlled 9) Liver mets 10)Bone mets Plan  -He was previously scheduled to start FOLFIRI on Thursday 07/13/2016 but after arriving for chemotherapy decided that he would like to postpone it after Thanksgiving . -Offered transfusion for symptomatic anemia . He wants to hold off on this currently . -Optimizing tube feeding . Continue to follow up with Ernestene Kiel . -Encouraged to maintain mobility . -Answered questions regarding chemotherapy . -Started on low-dose fentanyl patch for improved pain control. Can use when necessary oxycodone in addition for breakthrough pain. -Continue Marchelle Folks  -RTC with Dr Irene Limbo 1 week after starting FOLFIRI for toxicity check   .No orders of the defined types were placed in this encounter.   All of his questions and concerns were answered in detail.  I spent 20 minutes counseling the patient face to face. The total time spent in the appointment was 25 minutes and more than 50% was on counseling and direct patient cares.    Sullivan Lone MD [place] AAHIVMS [facility] Hematology/Oncology Physician [facility]  (Office):       [phone] (Work cell):  [phone] (Fax):           [phone]

## 2016-08-02 NOTE — Progress Notes (Signed)
Patient arrived on unit.  Personal chemo infusion noted to still be running.  Notified charge nurse on [place] notified.

## 2016-08-02 NOTE — Progress Notes (Signed)
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 He notes that he had some chest pain and arm pain which was more acute than his chronic discomfort from his esophageal cancer. He also reported significant diaphoresis which could likely be from cholinergic response from Irinotecan.  EKG did not show any overt ischemic changes, troponins negative echo is pending. I was informed the following afternoon about his admission. I went and evaluated the patient and had his 5-FU pump discontinued since 5-FU can sometimes cause chest pain with coronary vasospasm. Patient notes that he is having significant anxiety about having a pump and prefers to switch to a pill (Xeloda).  Currently patient's chest pain is nearly resolved and he has no diaphoresis or diarrhea currently. He certainly appears to be fairly anxious and or rebound with her was overall prognosis . He is agreeable to transfusion for some chronic anemia which was to be done in clinic anyways. He was not using his fentanyl patch as ordered at home and he was encouraged to continue using it to optimize pain control.   OBJECTIVE: NAD  PHYSICAL EXAMINATION: . Vitals:   08/02/16 0203 08/02/16 0435 08/02/16 0620 08/02/16 0844  BP: (!) 149/98 (!) 174/98 (!) 154/97 (!) 157/100  Pulse: 99 80 (!) 102 (!) 109  Resp: 18 18    Temp: 98.4 F (36.9 C) 98.1 F (36.7 C)  99 F (37.2 C)  TempSrc: Oral Oral  Oral  SpO2: 100% 100%  99%  Height:       There were no vitals filed for this visit. .There is no height or weight on file to calculate BMI.  GENERAL: alert, Somewhat anxious but in no acute distress . SKIN: No acute rashesYES: normal, conjunctiva are pink and non-injected, sclera clear OROPHARYNX: MMM NECK: supple, no JVD, thyroid normal size,  non-tender, without nodularity LYMPH:  no palpable lymphadenopathy in the cervical, axillary or inguinal LUNGS: clear to auscultation with normal respiratory effort HEART: regular rate & rhythm,  no murmurs and no lower extremity edema ABDOMEN: abdomen soft, non-tender, normoactive bowel sounds , no palpable hepatosplenomegaly ,. NG tube in situ Musculoskeletal: no cyanosis of digits and no clubbing  PSYCH: alert & oriented x 3 with fluent speech NEURO: no focal motor/sensory deficits  MEDICAL HISTORY:  Past Medical History:  Diagnosis Date  . Arthritis   . Bone cancer ([place])   . Esophageal cancer ([place])   . History of chemotherapy   . History of radiation therapy   . Hypertension   . Knee pain     SURGICAL HISTORY: Past Surgical History:  Procedure Laterality Date  . ESOPHAGOGASTRODUODENOSCOPY (EGD) WITH ESOPHAGEAL DILATION     and biopsy  . IR GENERIC HISTORICAL  07/05/2016   IR GASTROSTOMY TUBE MOD SED 07/05/2016 [facility]  . IR GENERIC HISTORICAL  07/07/2016   IR RADIOLOGIST EVAL & MGMT 07/07/2016 [name], PA-C [facility]  . IR GENERIC HISTORICAL  07/20/2016   IR RADIOLOGIST EVAL & MGMT [facility]  . IRRIGATION AND DEBRIDEMENT KNEE Right 04/14/2016   Procedure: IRRIGATION AND DEBRIDEMENT RIGHT KNEE, ARTHROTOMY;  Surgeon: Paralee Cancel, MD;  Location: [facility];  Service: Orthopedics;  Laterality: Right;  . KNEE SURGERY    . LESION REMOVAL  1969   hip    SOCIAL HISTORY: Social History  Social History  . Marital status: Single    Spouse name: N/A  . Number of children: 2  . Years of education: N/A   Occupational History  . Manufacturing industrial curtains    Social History Main Topics  . Smoking status: Current Every Day Smoker    Packs/day: 0.50    Years: 20.00    Types: Cigarettes  . Smokeless tobacco: Never Used  . Alcohol use No  . Drug use:     Types: Marijuana     Comment: last smoked 1 week ago  . Sexual activity: Yes   Other Topics  Concern  . 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 Esophageal cancer Father   . Cancer Father     nasopharyngeal   . Diabetes Sister   . Cancer Sister     multiple myeloma  . Colon cancer Neg Hx     ALLERGIES:  has No Known Allergies.  MEDICATIONS:  Scheduled Meds: . enoxaparin (LOVENOX) injection  40 mg Subcutaneous Q24H  . famotidine (PEPCID) IV  20 mg Intravenous Q12H  . feeding supplement (OSMOLITE 1.5 CAL)  237 mL Per Tube 5 X Daily  . fentaNYL  12.5 mcg Transdermal Q72H  . free water  200 mL Per Tube 5 X Daily   Continuous Infusions: PRN Meds:.acetaminophen, heparin lock flush, heparin lock flush, heparin lock flush, heparin lock flush, hydrALAZINE, LORazepam, morphine injection, ondansetron (ZOFRAN) IV, sodium chloride flush, sodium chloride flush, sodium chloride flush, sodium chloride flush, sodium chloride flush  REVIEW OF SYSTEMS:    10 Point review of Systems was done is negative except as noted above.   LABORATORY DATA:  I have reviewed the data as listed  . CBC Latest Ref Rng & Units 07/31/2016 07/31/2016 07/13/2016  WBC 4.0 - 10.5 K/uL 10.2 10.7(H) 12.5(H)  Hemoglobin 13.0 - 17.0 g/dL 8.1(L) 8.0(L) 8.7(L)  Hematocrit 39.0 - 52.0 % 24.8(L) 25.1(L) 27.2(L)  Platelets 150 - 400 K/uL 597(H) 530(H) 673(H)    . CMP Latest Ref Rng & Units 07/31/2016 07/31/2016 07/13/2016  Glucose 65 - 99 mg/dL 130(H) 124 106  BUN 6 - 20 mg/dL 9 7.2 10.6  Creatinine 0.61 - 1.24 mg/dL 0.52(L) 0.5(L) 0.6(L)  Sodium 135 - 145 mmol/L 130(L) 129(L) 134(L)  Potassium 3.5 - 5.1 mmol/L 4.4 4.3 4.2  Chloride 101 - 111 mmol/L 90(L) - -  CO2 22 - 32 mmol/L _0 Calcium 8.9 - 10.3 mg/dL 9.3 9.6 9.9  Total Protein 6.5 - 8.1  g/dL 8.1 7.4 7.4  Total Bilirubin 0.3 - 1.2 mg/dL 1.0 0.33 0.23  Alkaline Phos 38 - 126 U/L 323(H) 387(H) 237(H)  AST 15 - 41 U/L 55(H) 57(H) 41(H)  ALT 17 - 63 U/L _1 RADIOGRAPHIC STUDIES: I have personally reviewed the radiological images as listed and agreed with the findings in the report. Dg Chest 2 View  Result Date: 07/31/2016 CLINICAL DATA:  Chemotherapy earlier today. Chest pain beginning a few hours ago. History of esophageal cancer. Smoker. EXAM: CHEST  2 VIEW COMPARISON:  05/13/2016 FINDINGS: Power port type right central venous catheter with tip over the low SVC region. No  pneumothorax. Shallow inspiration with elevation of right hemidiaphragm. Linear atelectasis or scarring in the right lung base. No focal airspace disease or consolidation in the lungs. No blunting of costophrenic angles. Calcification of the aorta. Cardiac enlargement without vascular congestion. IMPRESSION: Shallow inspiration with elevation of right hemidiaphragm. Linear atelectasis in the right lung base. Cardiac enlargement. No focal consolidation or edema. Electronically Signed   By: Lucienne Capers M.D.   On: 07/31/2016 22:33   Ir Gastrostomy Tube Mod Sed  Result Date: 07/05/2016 INDICATION: Esophageal cancer EXAM: PERC PLACEMENT GASTROSTOMY MEDICATIONS: Ancef 2 g; Antibiotics were administered within 1 hour of the procedure. Glucagon 1 mg IV. ANESTHESIA/SEDATION: Versed 6 mg IV; Fentanyl 300 mcg IV Moderate Sedation Time:  40 The patient was continuously monitored during the procedure by the interventional radiology nurse under my direct supervision. CONTRAST:  58m ISOVUE-300 IOPAMIDOL (ISOVUE-300) INJECTION 61% - administered into the gastric lumen. FLUOROSCOPY TIME:  Fluoroscopy Time: 8 minutes 30 seconds (57 mGy). COMPLICATIONS: None immediate. PROCEDURE: The procedure, risks, benefits, and alternatives were explained to the patient. Questions regarding the procedure were encouraged and answered.  The patient understands and consents to the procedure. The epigastrium was prepped with Betadine in a sterile fashion, and a sterile drape was applied covering the operative field. A sterile gown and sterile gloves were used for the procedure. A 5-French orogastric tube is placed under fluoroscopic guidance. Scout imaging of the abdomen confirms barium within the transverse colon. The stomach was distended with gas. Under fluoroscopic guidance, an 18 gauge needle was utilized to puncture the anterior wall of the body of the stomach. An Amplatz wire was advanced through the needle passing a T fastener into the lumen of the stomach. T50T fasteners were secured for gastropexy. The 18 gauge needle was then advanced into the lumen of the stomach and removed over an Amplatz wire. The graduated 18 French peel-away sheath was then advanced over the Amplatz. An 152French gastrostomy tube was advanced over the wire and through the peel-away sheath. The balloon was insufflated with 10 cc saline and secured in place. Contrast was injected. Findings The image demonstrates placement of a 171French balloon retention type gastrostomy tube into the body of the stomach. IMPRESSION: Successful 18 French balloon retention gastrostomy. Electronically Signed   By: AMarybelle KillingsM.D.   On: 07/05/2016 15:31   Dg Esophagus  Result Date: 07/03/2016 CLINICAL DATA:  55year old male with esophageal cancer. Finished chemotherapy 2 - 3 months ago and radiation therapy 1 month ago. Subsequent encounter. EXAM: ESOPHOGRAM/BARIUM SWALLOW TECHNIQUE: Single contrast examination was performed using  thin barium. FLUOROSCOPY TIME:  Fluoroscopy Time:  18 seconds Radiation Exposure Index (if provided by the fluoroscopic device): 15.8 mGy Number of Acquired Spot Images: 38 COMPARISON:  06/05/2016 CT. FINDINGS: No laryngeal penetration or aspiration. Mild narrowing cervical esophagus. Irregular stricture mid to distal third of the esophagus  consistent with patient's known malignancy spans over 4.6 cm. Inferior extent located 8.2 cm proximal to the gastroesophageal junction. No obstruction at the level of the gastroesophageal junction. IMPRESSION: Irregular stricture mid to distal third of the esophagus consistent with patient's known malignancy spans over 4.6 cm. Inferior extent located 8.2 cm proximal to the gastroesophageal junction. Electronically Signed   By: SGenia DelM.D.   On: 07/03/2016 14:51   Ir Radiologist Eval & Mgmt  Result Date: 07/20/2016 KAscencion Dike PA-C     07/20/2016 12:55 PM Pt returns for removal of T-fasteners. S/p Perc G-tube on 11/1 Has  been doing well. 3 T-fasteners removed without difficulty. Bumper is snug, no leak. Follow up prn. Ascencion Dike PA-C Interventional Radiology 07/20/2016 12:54 PM   Ir Radiologist Eval & Mgmt  Result Date: 07/10/2016 Mr Scibilia is here today for recheck of his recently placed gastrostomy tube.  It was oozing post-operatively and QuickClot was placed around the tube. I removed this today without difficult, examined the site = looks good, no bleeding) and re-dressed the g-tube with 4x4 drain sponges and Hypafix tape.  I also instructed his wife how to flush the tube.  WENDY S BLAIR PA-C 07/07/2016 2:53 PM     ASSESSMENT & PLAN:   55 yo AAM with ECOG PS of 2 with   1) Metastatic moderately to poorly differentiated 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 S/p palliative radiation therapy to the esophagus and regional lymph nodes. He has tolerated treatment generally well except for some mild grade 2 radiation esophagitis which has now resolved. Repeat CT chest abdomen pelvis done 11/17/2015 show progression of his liver metastases and gastrohepatic lymph node as expected.  He has completed his 6 cycles of carboplatin and Taxol  With grade 1-2 fatigue and grade 1  neuropathy CT c/a/p with stable improved disease with some  T1 metastasis, as before. Additional lesions in the L3 vertebral body and right sacrum, corresponding to abnormalities on 04/06/2016 and worrisome for metastatic disease.  CT CAP 06/05/2016 -- significant progression of liver mets and concern for new abnormal thickening of the esophagus -likely reflecting local tumor recurrence.  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 EGD done 06/26/2016 confirms local recurrence of SCC with PDL1 expression 0% -Patient is nothing by mouth and on tube feeding at this time.  3) Neoplasm related pain especially from bone metastases in his lower back - controlled with radiation. 4 Liver mets 5)Bone mets  6) Chest pain with  RUE pain and diaphoresis. PLAN  -Acute coronary syndrome rule out as per hospital medicine team. Troponins of an unimpressive. EKG did not show acute ST-T wave changes. Patient awaiting echocardiogram. -Part of the pain could be from his esophageal cancer. He was recently started on fentanyl patch in clinic but was not using it. He is currently back on fentanyl  patch and can use when necessary SL oxycodone for pain control. -Would transfuse him 2 units of PRBCs as scheduled for symptomatically anemia. -Discontinue his 5-FU pump since 5-FU can cause coronary vasospasm causing chest pain. -I believe his diaphoresis was likely related to Cholinergic symptoms from Irinotecan. We will premedicate him with atropine with next cycle of chemotherapy. -If pain is persistent or worsening might need to get a CT of the chest to rule out esophageal perforation. -Patient is keen not to use the 5-FU pump. We will try to switch him to Xeloda cancer instead of 5FU and continue Irinotecan. -If his symptoms are controlled and no evidence of acute coronary  syndrome patient might be discharged tomorrow with planned follow-up with me in a week.  I spent 40 minutes counseling the patient face to face. The total time spent in the appointment was 40 minutes and more than 50% was on counseling and direct patient cares.    Sullivan Lone MD Merriam Woods AAHIVMS [facility] Hematology/Oncology Physician [place]  (Office):  [phone] (Work cell):  [phone] (Fax):           [phone]  08/02/2016 9:57 AM

## 2016-08-03 ENCOUNTER — Inpatient Hospital Stay (HOSPITAL_COMMUNITY): Admission: RE | Admit: 2016-08-03 | Payer: 59 | Source: Ambulatory Visit

## 2016-08-03 LAB — TYPE AND SCREEN
ABO/RH(D): O POS
ANTIBODY SCREEN: NEGATIVE
UNIT DIVISION: 0
Unit division: 0

## 2016-08-07 ENCOUNTER — Other Ambulatory Visit: Payer: Self-pay | Admitting: *Deleted

## 2016-08-07 ENCOUNTER — Ambulatory Visit (HOSPITAL_COMMUNITY)
Admission: RE | Admit: 2016-08-07 | Discharge: 2016-08-07 | Disposition: A | Discharge status: Home / Self Care | Payer: 59 | Source: Ambulatory Visit | Attending: Hematology | Admitting: Hematology

## 2016-08-07 ENCOUNTER — Ambulatory Visit (HOSPITAL_BASED_OUTPATIENT_CLINIC_OR_DEPARTMENT_OTHER): Payer: 59 | Admitting: Hematology

## 2016-08-07 ENCOUNTER — Telehealth: Payer: Self-pay | Admitting: Hematology

## 2016-08-07 ENCOUNTER — Other Ambulatory Visit (HOSPITAL_BASED_OUTPATIENT_CLINIC_OR_DEPARTMENT_OTHER): Payer: 59

## 2016-08-07 DIAGNOSIS — C7951 Secondary malignant neoplasm of bone: Secondary | ICD-10-CM

## 2016-08-07 DIAGNOSIS — C155 Malignant neoplasm of lower third of esophagus: Secondary | ICD-10-CM

## 2016-08-07 DIAGNOSIS — C787 Secondary malignant neoplasm of liver and intrahepatic bile duct: Secondary | ICD-10-CM | POA: Diagnosis not present

## 2016-08-07 DIAGNOSIS — C159 Malignant neoplasm of esophagus, unspecified: Secondary | ICD-10-CM

## 2016-08-07 LAB — COMPREHENSIVE METABOLIC PANEL
ALBUMIN: 3 g/dL — AB (ref 3.5–5.0)
ALK PHOS: 249 U/L — AB (ref 40–150)
ALT: 17 U/L (ref 0–55)
AST: 33 U/L (ref 5–34)
Anion Gap: 13 mEq/L — ABNORMAL HIGH (ref 3–11)
BILIRUBIN TOTAL: 0.47 mg/dL (ref 0.20–1.20)
BUN: 9.7 mg/dL (ref 7.0–26.0)
CALCIUM: 10 mg/dL (ref 8.4–10.4)
CO2: 28 mEq/L (ref 22–29)
CREATININE: 0.6 mg/dL — AB (ref 0.7–1.3)
Chloride: 88 mEq/L — ABNORMAL LOW (ref 98–109)
EGFR: >90 mL/min/{1.73_m2} (ref >90)
GLUCOSE: 102 mg/dL (ref 70–140)
Potassium: 4.3 mEq/L (ref 3.5–5.1)
SODIUM: 130 meq/L — AB (ref 136–145)
TOTAL PROTEIN: 7.7 g/dL (ref 6.4–8.3)

## 2016-08-07 LAB — CBC WITH DIFFERENTIAL/PLATELET
BASO%: 0.6 % (ref 0.0–2.0)
BASOS ABS: 0.1 10*3/uL (ref 0.0–0.1)
EOS%: 1.5 % (ref 0.0–7.0)
Eosinophils Absolute: 0.1 10*3/uL (ref 0.0–0.5)
HEMATOCRIT: 33.2 % — AB (ref 38.4–49.9)
HEMOGLOBIN: 11 g/dL — AB (ref 13.0–17.1)
LYMPH#: 1.6 10*3/uL (ref 0.9–3.3)
LYMPH%: 16.6 % (ref 14.0–49.0)
MCH: 28.7 pg (ref 27.2–33.4)
MCHC: 33.1 g/dL (ref 32.0–36.0)
MCV: 86.7 fL (ref 79.3–98.0)
MONO#: 1.1 10*3/uL — ABNORMAL HIGH (ref 0.1–0.9)
MONO%: 11.5 % (ref 0.0–14.0)
NEUT%: 69.8 % (ref 39.0–75.0)
NEUTROS ABS: 6.7 10*3/uL — AB (ref 1.5–6.5)
Platelets: 578 10*3/uL — ABNORMAL HIGH (ref 140–400)
RBC: 3.83 10*6/uL — ABNORMAL LOW (ref 4.20–5.82)
RDW: 16.3 % — AB (ref 11.0–14.6)
WBC: 9.5 10*3/uL (ref 4.0–10.3)

## 2016-08-07 MED ORDER — CAPECITABINE 500 MG PO TABS: 1000.0000 mg/m2 | ORAL_TABLET | Freq: Two times a day (BID) | ORAL | 2 refills | Status: DC

## 2016-08-07 MED ORDER — OXYCODONE HCL 5 MG/5ML PO SOLN: 5.0000 mg | ORAL | 0 refills | Status: DC | PRN

## 2016-08-07 MED ORDER — GABAPENTIN 100 MG PO CAPS: 100.0000 mg | ORAL_CAPSULE | Freq: Three times a day (TID) | ORAL | 1 refills | Status: DC

## 2016-08-07 MED ORDER — SENNOSIDES-DOCUSATE SODIUM 8.6-50 MG PO TABS: 2.0000 | ORAL_TABLET | Freq: Every day | ORAL | 1 refills | Status: DC

## 2016-08-07 MED ORDER — FENTANYL 25 MCG/HR TD PT72: 25.0000 ug | MEDICATED_PATCH | TRANSDERMAL | 0 refills | Status: DC

## 2016-08-07 MED FILL — oxyCODONE HCL 5 MG/5ML SOLN: 5 | 8 days supply | Qty: 473 | Fill #0

## 2016-08-07 MED FILL — GABAPENTIN 100 MG CAPSULE: 100 | 30 days supply | Qty: 90 | Fill #0

## 2016-08-07 MED FILL — CAPECITABINE 500 MG TABLET: 500 | 14 days supply | Qty: 84 | Fill #0

## 2016-08-07 NOTE — Telephone Encounter (Signed)
Appointments scheduled per 12/4 LOS. Patient given AVS report and calendars with future scheduled appointments.   Last injection for the patient was on 11/27...too early to schedule injection for 12/4 per injection nurse. Scheduled for 12/27 instead.

## 2016-08-08 ENCOUNTER — Other Ambulatory Visit: Payer: Self-pay | Admitting: Hematology

## 2016-08-08 ENCOUNTER — Telehealth: Payer: Self-pay | Admitting: *Deleted

## 2016-08-08 ENCOUNTER — Telehealth: Payer: Self-pay | Admitting: Pharmacist

## 2016-08-08 NOTE — Telephone Encounter (Signed)
Oral Chemotherapy Pharmacist Encounter  Received new prescription for Xeloda for patient to use in combination with irinotecan for esophageal cancer. Labs from 12/4 reviewed, OK for treatment. Current medication list in Epic assessed, no significant DDIs with Xeloda identified.  Prescription will be sent to [facility] for benefits analysis.  Oral Oncology Clinic will continue to follow.  Johny Drilling, PharmD, BCPS, BCOP 08/08/2016  12:15 PM Oral Oncology Clinic [phone]

## 2016-08-08 NOTE — Telephone Encounter (Signed)
Per LOS I have scheduled appts and notified the scheduler 

## 2016-08-08 NOTE — Progress Notes (Signed)
[name] Kitchen    HEMATOLOGY/ONCOLOGY CLINIC NOTE  Date of Service: .08/07/2016    Patient Care Team: Leanna Battles, MD as PCP - General (Internal Medicine)  CHIEF COMPLAINTS/PURPOSE OF CONSULTATION:  Metastatic Esophageal Squamous cell carcinoma   HISTORY OF PRESENTING ILLNESS: plz see my initial consultation regarding patients initial presentation  INTERVAL HISTORY  Samuel Henderson is here for his scheduled follow-up for toxicity check after receiving his first cycle of palliative  FOLFIRI. He was admitted to the hospital for chest pain and acute coronary syndrome was ruled out . The blood transfusions for symptomatically anemia . he is feeling a little better after the transfusions. He is not want to use 5-FU pump and would prefer to switch to something oral instead. I have placed a prescription order for Xeloda to switch to Xeliri with his next cycle. Notes that he is mid/upper thoracic back pain possibly from his T1 vertebral lesion. This appears to be radiating to both his upper extremities. He notes that this is his primary discomfort at this time. We increased his fentanyl patch from 12.5 g per hour to 25 mcg/h since he has been requiring a fair amount of the oxycodone. Given refill for oxycodone. No other acute new symptoms at this time. No hemoptysis no overt GI bleeding.   MEDICAL HISTORY:  Past Medical History:  Diagnosis Date  . Arthritis   . Bone cancer ([place])   . Esophageal cancer ([place])   . History of chemotherapy   . History of radiation therapy   . Hypertension   . Knee pain     SURGICAL HISTORY: Past Surgical History:  Procedure Laterality Date  . ESOPHAGOGASTRODUODENOSCOPY (EGD) WITH ESOPHAGEAL DILATION     and biopsy  . IR GENERIC HISTORICAL  07/05/2016   IR GASTROSTOMY TUBE MOD SED 07/05/2016 [facility]  . IR GENERIC HISTORICAL  07/07/2016   IR RADIOLOGIST EVAL & MGMT 07/07/2016 [name], PA-C [facility]  . IR GENERIC HISTORICAL  07/20/2016   IR  RADIOLOGIST EVAL & MGMT [facility]  . IRRIGATION AND DEBRIDEMENT KNEE Right 04/14/2016   Procedure: IRRIGATION AND DEBRIDEMENT RIGHT KNEE, ARTHROTOMY;  Surgeon: Paralee Cancel, MD;  Location: [facility];  Service: Orthopedics;  Laterality: Right;  . KNEE SURGERY    . LESION REMOVAL  1969   hip    SOCIAL HISTORY: Social History   Social History  . Marital status: Single    Spouse name: N/A  . Number of children: 2  . Years of education: N/A   Occupational History  . Manufacturing industrial curtains    Social History Main Topics  . Smoking status: Current Every Day Smoker    Packs/day: 0.50    Years: 20.00    Types: Cigarettes  . Smokeless tobacco: Never Used  . Alcohol use No  . Drug use:     Types: Marijuana     Comment: last smoked 1 week ago  . Sexual activity: Yes   Other Topics Concern  . 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 Sober for 15 yrs  Ex smoker 1/2 PPD x 75yr quit about 18 yrs ago.  FAMILY HISTORY: Family History  Problem Relation Age of  Onset  . Esophageal cancer Father   . Cancer Father     nasopharyngeal   . Diabetes Sister   . Cancer Sister     multiple myeloma  . Colon cancer Neg Hx     ALLERGIES:  has No Known Allergies.  MEDICATIONS:  Current Outpatient Prescriptions  Medication Sig Dispense Refill  . amLODipine (NORVASC) 5 MG tablet Take 1 tablet (5 mg total) by mouth daily. 30 tablet 0  . docusate sodium (COLACE) 100 MG capsule Take 100 mg by mouth daily as needed for mild constipation.    . fentaNYL (DURAGESIC - DOSED MCG/HR) 25 MCG/HR patch Place 1 patch (25 mcg total) onto the skin every 3 (three) days. 10 patch 0  . lidocaine-prilocaine (EMLA) cream Apply 1 application topically  once. 30 g 1  . Nutritional Supplements (FEEDING SUPPLEMENT, OSMOLITE 1.5 CAL,) LIQD Place 1,000 mLs into feeding tube 4 (four) times daily.    . ondansetron (ZOFRAN) 8 MG tablet Take 1 tablet (8 mg total) by mouth every 8 (eight) hours as needed for nausea or vomiting. 30 tablet 3  . oxyCODONE (ROXICODONE) 5 MG/5ML solution Take 5-10 mLs (5-10 mg total) by mouth every 4 (four) hours as needed for moderate pain or severe pain. 473 mL 0  . senna-docusate (SENNA S) 8.6-50 MG tablet Take 2 tablets by mouth at bedtime. 60 tablet 1  . Water For Irrigation, Sterile (FREE WATER) SOLN Place 200 mLs into feeding tube 5 (five) times daily. 1000 mL 0  . capecitabine (XELODA) 500 MG tablet Take 3 tablets (1,500 mg total) by mouth 2 (two) times daily after a meal. 2 weeks on 1 week off 84 tablet 2  . gabapentin (NEURONTIN) 100 MG capsule Take 1 capsule (100 mg total) by mouth 3 (three) times daily. 90 capsule 1   No current facility-administered medications for this visit.     REVIEW OF SYSTEMS:    10 Point review of Systems was done is negative except as noted above.  PHYSICAL EXAMINATION: ECOG PERFORMANCE STATUS: 2  . Vitals:   08/07/16 1430  BP: (!) 149/80  Pulse: 94  Resp: 19  Temp: 98.5 F (36.9 C)   Filed Weights   08/07/16 1430  Weight: 112 lb 14.4 oz (51.2 kg)   .Body mass index is 16.67 kg/m.  [name] Kitchen GENERAL:middle aged AAM ,alert, in no acute distress and comfortable SKIN: skin color, texture, turgor are normal, no rashes or significant lesions EYES: normal, conjunctiva are pink and non-injected, sclera clear OROPHARYNX:no exudate, no erythema and lips, buccal mucosa, and tongue normal  NECK: supple, no JVD, thyroid normal size, non-tender, without nodularity LYMPH:  no palpable lymphadenopathy in the cervical, axillary or inguinal LUNGS: clear to auscultation with normal respiratory effort HEART: regular rate & rhythm,  no murmurs and no lower extremity edema ABDOMEN: abdomen  soft, non-tender, normoactive bowel sounds  Musculoskeletal: no cyanosis of digits and no clubbing  PSYCH: alert & oriented x 3 with fluent speech NEURO: no focal motor/sensory deficits  LABORATORY DATA:  I have reviewed the data as listed. CBC Latest Ref Rng & Units 08/07/2016 07/31/2016 07/31/2016  WBC 4.0 - 10.3 10e3/uL 9.5 10.2 10.7(H)  Hemoglobin 13.0 - 17.1 g/dL 11.0(L) 8.1(L) 8.0(L)  Hematocrit 38.4 - 49.9 % 33.2(L) 24.8(L) 25.1(L)  Platelets 140 - 400 10e3/uL 578(H) 597(H) 530(H)    . CMP Latest Ref Rng & Units 08/07/2016 07/31/2016 07/31/2016  Glucose 70 - 140 mg/dl 102 130(H) 124  BUN 7.0 - 26.0  mg/dL 9.7 9 7.2  Creatinine 0.7 - 1.3 mg/dL 0.6(L) 0.52(L) 0.5(L)  Sodium 136 - 145 mEq/L 130(L) 130(L) 129(L)  Potassium 3.5 - 5.1 mEq/L 4.3 4.4 4.3  Chloride 101 - 111 mmol/L - 90(L) -  CO2 22 - 29 mEq/L 28 28 28   Calcium 8.4 - 10.4 mg/dL 10.0 9.3 9.6  Total Protein 6.4 - 8.3 g/dL 7.7 8.1 7.4  Total Bilirubin 0.20 - 1.20 mg/dL 0.47 1.0 0.33  Alkaline Phos 40 - 150 U/L 249(H) 323(H) 387(H)  AST 5 - 34 U/L 33 55(H) 57(H)  ALT 0 - 55 U/L 17 19 17           Radiology  CT Chest/abd/pelvis 06/05/2016  IMPRESSION: 1. Significant interval progression of liver metastases. 2. New abnormal thickening of the esophagus which may reflect local tumor recurrence. 3. Stable lytic lesion involving the T1 vertebra and sclerotic lesion involving the L3 vertebra. 4. Similar appearance of gastrohepatic ligament lymph node.   Electronically Signed   By: Kerby Moors M.D.   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 S/p palliative radiation therapy to the esophagus and regional lymph nodes. He has tolerated treatment generally well except for some mild  grade 2 radiation esophagitis which has now resolved. Repeat CT chest abdomen pelvis done 11/17/2015 show progression of his liver metastases and gastrohepatic lymph node as expected.  He has completed his 6 cycles of carboplatin and Taxol  With grade 1-2 fatigue and grade 1 neuropathy CT c/a/p with stable improved disease with some  T1 metastasis, as before. Additional lesions in the L3 vertebral body and right sacrum, corresponding to abnormalities on 04/06/2016 and worrisome for metastatic disease.  CT CAP 06/05/2016 -- significant progression of liver mets and concern for new abnormal thickening of the esophagus -likely reflecting local tumor recurrence.  EGD done 06/26/2016 confirms local recurrence of SCC with PDL1 expression 0%  2) HTN- patient notes his appetite has improved and he is eating better. On amlodipine. 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 EGD done 06/26/2016 confirms local recurrence of SCC with PDL1 expression 0% 4) grade 2 radiation esophagitis. Resolved . 5) leukocytosis likely due to Neulasta. No evidence of infection at this time. 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 This has improved some and remains nonpainful. 8) neoplasm related pain especially from bone metastases in his lower back - controlled 9) neoplasm related pain in the upper thoracic spine T1 (bone mets). Radiating to bilateral upper extremities suggesting C7 nerve root involvement. 9) Liver mets 10)Bone mets Plan -Patient had admission for some chest pain which could have been from his infusional 5-FU or symptomatic anemia. He does not want to use the 5-FU pump anymore. -We'll switch to Xeloda acid of the 5-FU leucovorin. Prescription written and given to the pharmacist for approval. -he had excessive sweating with the Irinotecan. Premedicated with atropine. Ensure the  patient has Lomotil on discharge. -Optimizing tube feeding . Continue to follow up with Ernestene Kiel . -Encouraged to maintain mobility . -Increased fentanyl patch at 25 g/h . -Refill given on oxycodone -Continue Xgeva q4weeks -Will ask radiation oncology to review possibility of palliative radiation treatments  T1 lesion given that it is now likely symptomatic.  will continue Xgeva q4weeks (next dose patient prefers it be done today as opposed to 08/10/2016) -continue FOLFOX as per schedule - will be switched being 5-FU to Xeloda and Irinotecan to q3weeks -Return to clinic with Dr. Irene Limbo in 2 weeks with labs.  All of his questions and concerns were answered in detail.  I spent 20 minutes counseling the patient face to face. The total time spent in the appointment was 25 minutes and more than 50% was on counseling and direct patient cares.    Sullivan Lone MD [place] AAHIVMS [facility] Hematology/Oncology Physician [facility]  (Office):       [phone] (Work cell):  [phone] (Fax):           [phone]

## 2016-08-09 ENCOUNTER — Telehealth: Payer: Self-pay | Admitting: Radiation Oncology

## 2016-08-09 NOTE — Telephone Encounter (Signed)
I called the patient to review options for treatment to his thoracic spine, but he didn't answer. I contacted his mother and they will come for simulation on Friday. We discussed palliative RT to the T1 site, and a course most likely of 10 treatments.

## 2016-08-10 ENCOUNTER — Ambulatory Visit: Payer: 59

## 2016-08-11 ENCOUNTER — Ambulatory Visit
Admission: RE | Admit: 2016-08-11 | Discharge: 2016-08-11 | Disposition: A | Discharge status: Home / Self Care | Payer: Medicaid Other | Source: Ambulatory Visit | Attending: Radiation Oncology | Admitting: Radiation Oncology

## 2016-08-11 DIAGNOSIS — C7949 Secondary malignant neoplasm of other parts of nervous system: Secondary | ICD-10-CM | POA: Insufficient documentation

## 2016-08-11 DIAGNOSIS — Z5111 Encounter for antineoplastic chemotherapy: Secondary | ICD-10-CM | POA: Diagnosis not present

## 2016-08-11 NOTE — Addendum Note (Signed)
Encounter addended by: [name], MD on: 08/11/2016  4:43 PM<BR>    Actions taken: Problem List reviewed

## 2016-08-11 NOTE — Progress Notes (Signed)
  Radiation Oncology         [phone] ________________________________  Name: DAQUANN DAZA MRN: [id number]  Date: 08/11/2016  DOB: 08/10/1961  SIMULATION AND TREATMENT PLANNING NOTE    ICD-9-CM ICD-10-CM   1. 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  Identity was confirmed.  All relevant records and images related to the planned course of therapy were reviewed.  The patient freely provided informed written consent to proceed with treatment after reviewing the details related to the planned course of therapy. The consent form was witnessed and verified by the simulation staff.  Then, the patient was set-up in a stable reproducible  supine position for radiation therapy.  CT images were obtained.  Surface markings were placed.  The CT images were loaded into the planning software.  Then the target and avoidance structures were contoured.  Treatment planning then occurred.  The radiation prescription was entered and confirmed.  Then, I designed and supervised the construction of a total of 3 medically necessary complex treatment devices including mask and 2 MLC apertures.  I have requested : Isodose Plan  PLAN:  The patient will receive 30 Gy in 10 fractions.  ________________________________  Sheral Apley Tammi Klippel, M.D.

## 2016-08-16 ENCOUNTER — Ambulatory Visit: Payer: 59 | Admitting: Radiation Oncology

## 2016-08-16 ENCOUNTER — Telehealth: Payer: Self-pay | Admitting: *Deleted

## 2016-08-16 ENCOUNTER — Other Ambulatory Visit: Payer: Self-pay | Admitting: *Deleted

## 2016-08-16 ENCOUNTER — Ambulatory Visit
Admission: RE | Admit: 2016-08-16 | Discharge: 2016-08-16 | Disposition: A | Discharge status: Home / Self Care | Payer: 59 | Source: Ambulatory Visit | Attending: Radiation Oncology | Admitting: Radiation Oncology

## 2016-08-16 ENCOUNTER — Ambulatory Visit: Payer: 59

## 2016-08-16 DIAGNOSIS — C159 Malignant neoplasm of esophagus, unspecified: Secondary | ICD-10-CM

## 2016-08-16 DIAGNOSIS — Z5111 Encounter for antineoplastic chemotherapy: Secondary | ICD-10-CM | POA: Diagnosis not present

## 2016-08-16 MED ORDER — OXYCODONE HCL 5 MG/5ML PO SOLN: 5.0000 mg | ORAL | 0 refills | Status: DC | PRN

## 2016-08-16 NOTE — Telephone Encounter (Signed)
Pt request refill on Oxycodone liquid.  Message given to Dr. Grier Mitts RN.

## 2016-08-17 ENCOUNTER — Ambulatory Visit
Admission: RE | Admit: 2016-08-17 | Discharge: 2016-08-17 | Disposition: A | Discharge status: Home / Self Care | Payer: 59 | Source: Ambulatory Visit | Attending: Radiation Oncology | Admitting: Radiation Oncology

## 2016-08-17 DIAGNOSIS — Z5111 Encounter for antineoplastic chemotherapy: Secondary | ICD-10-CM | POA: Diagnosis not present

## 2016-08-17 MED FILL — fentaNYL 25 MCG/HR PT72: 25 | 30 days supply | Qty: 10 | Fill #0

## 2016-08-17 MED FILL — oxyCODONE HCL 5 MG/5ML SOLN: 5 | 8 days supply | Qty: 473 | Fill #0

## 2016-08-18 ENCOUNTER — Ambulatory Visit
Admission: RE | Admit: 2016-08-18 | Discharge: 2016-08-18 | Disposition: A | Discharge status: Home / Self Care | Payer: 59 | Source: Ambulatory Visit | Attending: Radiation Oncology | Admitting: Radiation Oncology

## 2016-08-18 ENCOUNTER — Encounter: Payer: Self-pay | Admitting: Radiation Oncology

## 2016-08-18 DIAGNOSIS — Z5111 Encounter for antineoplastic chemotherapy: Secondary | ICD-10-CM | POA: Diagnosis not present

## 2016-08-21 ENCOUNTER — Encounter: Payer: Self-pay | Admitting: Radiation Oncology

## 2016-08-21 ENCOUNTER — Ambulatory Visit
Admission: RE | Admit: 2016-08-21 | Discharge: 2016-08-21 | Disposition: A | Discharge status: Home / Self Care | Payer: 59 | Source: Ambulatory Visit | Attending: Radiation Oncology | Admitting: Radiation Oncology

## 2016-08-21 VITALS — BP 119/88 | HR 104 | Temp 98.4°F | Resp 18 | Ht 69.0 in | Wt 110.6 lb

## 2016-08-21 DIAGNOSIS — Z5111 Encounter for antineoplastic chemotherapy: Secondary | ICD-10-CM | POA: Diagnosis not present

## 2016-08-21 DIAGNOSIS — C7949 Secondary malignant neoplasm of other parts of nervous system: Secondary | ICD-10-CM

## 2016-08-21 NOTE — Addendum Note (Signed)
Encounter addended by: Malena Edman, RN on: 08/21/2016  5:23 PM<BR>    Actions taken: Patient Education assessment filed

## 2016-08-21 NOTE — Progress Notes (Addendum)
Mr. Samuel Henderson has received 4 radiation treatments to C3 spine.  Skin to C3 spine with normal color.   Patient education for pertinent areas skin changes,pain, swallowing issues.  Had recent radiation to esophagus did not give a Radiation and you booklet.   Having pain to arms and back 7/10 taking Oxycodone and using Duragestic patch as instructed.  Reports swallowing issues has a peg tube taking in 1,000 ml of Osmolite daily 4 cans a day and flushing peg tube with 200 ml qid and drinking fluid around two to three glasses by mouth.  Skin with normal color to C3 spine area. Wt Readings from Last 3 Encounters:  08/21/16 110 lb 9.6 oz (50.2 kg)  08/18/16 111 lb 6.4 oz (50.5 kg)  08/07/16 112 lb 14.4 oz (51.2 kg)  BP 119/88   Pulse (!) 104   Temp 98.4 F (36.9 C) (Oral)   Resp 18   Ht 5\' 9"  (1.753 m)   Wt 110 lb 9.6 oz (50.2 kg)   SpO2 100%   BMI 16.33 kg/m

## 2016-08-21 NOTE — Progress Notes (Signed)
  Radiation Oncology         [phone]   Name: Samuel Henderson MRN: [id number]   Date: 08/21/2016  DOB: Jan 01, 1961   Weekly Radiation Therapy Management    ICD-9-CM ICD-10-CM   1. T1 spinal metastasis (HCC) 198.3 C79.49     Current Dose: 12 Gy  Planned Dose:  30 Gy  Narrative The patient presents for routine under treatment assessment.  The patient has received 4 radiation treatments to C3 spine.  Skin to C3 spine with normal color. Reports pain to arms and back as a 7/10. Taking Oxycodone and using Duragestic patch as instructed.  Reports swallowing issues. Has a peg tube taking in 1,000 ml of Osmolite daily, 4 cans a day, flushing peg tube with 200 ml qid, and drinking fluid around two to three glasses by mouth.  Set-up films were reviewed. The chart was checked.  Physical Findings  height is 5\' 9"  (1.753 m) and weight is 110 lb 9.6 oz (50.2 kg). His oral temperature is 98.4 F (36.9 C). His blood pressure is 119/88 and his pulse is 104 (abnormal). His respiration is 18 and oxygen saturation is 100%. . Weight essentially stable.  No significant changes.  Impression The patient is tolerating radiation.  Plan Continue treatment as planned.         Sheral Apley Tammi Klippel, M.D.  This document serves as a record of services personally performed by [name], MD. It was created on his behalf by Darcus Austin, a trained medical scribe. The creation of this record is based on the scribe's personal observations and the provider's statements to them. This document has been checked and approved by the attending provider.

## 2016-08-22 ENCOUNTER — Ambulatory Visit
Admission: RE | Admit: 2016-08-22 | Discharge: 2016-08-22 | Disposition: A | Discharge status: Home / Self Care | Payer: 59 | Source: Ambulatory Visit | Attending: Radiation Oncology | Admitting: Radiation Oncology

## 2016-08-22 DIAGNOSIS — Z5111 Encounter for antineoplastic chemotherapy: Secondary | ICD-10-CM | POA: Diagnosis not present

## 2016-08-23 ENCOUNTER — Other Ambulatory Visit: Payer: Self-pay | Admitting: *Deleted

## 2016-08-23 ENCOUNTER — Ambulatory Visit
Admission: RE | Admit: 2016-08-23 | Discharge: 2016-08-23 | Disposition: A | Discharge status: Home / Self Care | Payer: 59 | Source: Ambulatory Visit | Attending: Radiation Oncology | Admitting: Radiation Oncology

## 2016-08-23 ENCOUNTER — Other Ambulatory Visit (HOSPITAL_BASED_OUTPATIENT_CLINIC_OR_DEPARTMENT_OTHER): Payer: Medicaid Other

## 2016-08-23 ENCOUNTER — Ambulatory Visit (HOSPITAL_BASED_OUTPATIENT_CLINIC_OR_DEPARTMENT_OTHER): Payer: Medicaid Other | Admitting: Hematology

## 2016-08-23 VITALS — BP 122/85 | HR 91 | Temp 98.7°F | Resp 18 | Wt 108.0 lb

## 2016-08-23 DIAGNOSIS — C787 Secondary malignant neoplasm of liver and intrahepatic bile duct: Secondary | ICD-10-CM | POA: Diagnosis not present

## 2016-08-23 DIAGNOSIS — T66XXXA Radiation sickness, unspecified, initial encounter: Secondary | ICD-10-CM

## 2016-08-23 DIAGNOSIS — Z7189 Other specified counseling: Secondary | ICD-10-CM

## 2016-08-23 DIAGNOSIS — C155 Malignant neoplasm of lower third of esophagus: Secondary | ICD-10-CM | POA: Diagnosis not present

## 2016-08-23 DIAGNOSIS — C159 Malignant neoplasm of esophagus, unspecified: Secondary | ICD-10-CM

## 2016-08-23 DIAGNOSIS — K208 Other esophagitis: Secondary | ICD-10-CM | POA: Diagnosis not present

## 2016-08-23 DIAGNOSIS — C7951 Secondary malignant neoplasm of bone: Secondary | ICD-10-CM | POA: Diagnosis not present

## 2016-08-23 DIAGNOSIS — Z5111 Encounter for antineoplastic chemotherapy: Secondary | ICD-10-CM | POA: Diagnosis not present

## 2016-08-23 LAB — CBC & DIFF AND RETIC
BASO%: 0.3 % (ref 0.0–2.0)
Basophils Absolute: 0 10*3/uL (ref 0.0–0.1)
EOS ABS: 0.2 10*3/uL (ref 0.0–0.5)
EOS%: 2.1 % (ref 0.0–7.0)
HCT: 34.3 % — ABNORMAL LOW (ref 38.4–49.9)
HEMOGLOBIN: 10.9 g/dL — AB (ref 13.0–17.1)
IMMATURE RETIC FRACT: 12.3 % — AB (ref 3.00–10.60)
LYMPH#: 2.1 10*3/uL (ref 0.9–3.3)
LYMPH%: 29.1 % (ref 14.0–49.0)
MCH: 28 pg (ref 27.2–33.4)
MCHC: 31.8 g/dL — ABNORMAL LOW (ref 32.0–36.0)
MCV: 88.2 fL (ref 79.3–98.0)
MONO#: 1.2 10*3/uL — AB (ref 0.1–0.9)
MONO%: 16.3 % — ABNORMAL HIGH (ref 0.0–14.0)
NEUT%: 52.2 % (ref 39.0–75.0)
NEUTROS ABS: 3.8 10*3/uL (ref 1.5–6.5)
Platelets: 399 10*3/uL (ref 140–400)
RBC: 3.89 10*6/uL — AB (ref 4.20–5.82)
RDW: 17.1 % — AB (ref 11.0–14.6)
RETIC %: 0.77 % — AB (ref 0.80–1.80)
RETIC CT ABS: 29.95 10*3/uL — AB (ref 34.80–93.90)
WBC: 7.2 10*3/uL (ref 4.0–10.3)

## 2016-08-23 LAB — COMPREHENSIVE METABOLIC PANEL
ALBUMIN: 3.3 g/dL — AB (ref 3.5–5.0)
ALK PHOS: 202 U/L — AB (ref 40–150)
ALT: 24 U/L (ref 0–55)
AST: 27 U/L (ref 5–34)
Anion Gap: 11 mEq/L (ref 3–11)
BILIRUBIN TOTAL: 0.46 mg/dL (ref 0.20–1.20)
BUN: 11.7 mg/dL (ref 7.0–26.0)
CO2: 31 meq/L — AB (ref 22–29)
Calcium: 9.8 mg/dL (ref 8.4–10.4)
Chloride: 95 mEq/L — ABNORMAL LOW (ref 98–109)
Creatinine: 0.6 mg/dL — ABNORMAL LOW (ref 0.7–1.3)
GLUCOSE: 97 mg/dL (ref 70–140)
Potassium: 3.9 mEq/L (ref 3.5–5.1)
SODIUM: 136 meq/L (ref 136–145)
TOTAL PROTEIN: 7.7 g/dL (ref 6.4–8.3)

## 2016-08-23 MED ORDER — OXYCODONE HCL 5 MG/5ML PO SOLN: 10.0000 mg | ORAL | 0 refills | Status: DC | PRN

## 2016-08-23 MED ORDER — MAGIC MOUTHWASH W/LIDOCAINE: 5.0000 mL | Freq: Four times a day (QID) | ORAL | 1 refills | Status: DC | PRN

## 2016-08-23 MED ORDER — SENNA 8.8 MG/5ML PO SYRP: 5.0000 mL | ORAL_SOLUTION | Freq: Two times a day (BID) | ORAL | 1 refills | Status: DC

## 2016-08-23 MED ORDER — FENTANYL 37.5 MCG/HR TD PT72: 37.5000 ug | MEDICATED_PATCH | TRANSDERMAL | 0 refills | Status: DC

## 2016-08-24 ENCOUNTER — Ambulatory Visit
Admission: RE | Admit: 2016-08-24 | Discharge: 2016-08-24 | Disposition: A | Discharge status: Home / Self Care | Payer: 59 | Source: Ambulatory Visit | Attending: Radiation Oncology | Admitting: Radiation Oncology

## 2016-08-24 ENCOUNTER — Telehealth: Payer: Self-pay | Admitting: Hematology

## 2016-08-24 DIAGNOSIS — Z5111 Encounter for antineoplastic chemotherapy: Secondary | ICD-10-CM | POA: Diagnosis not present

## 2016-08-24 NOTE — Telephone Encounter (Signed)
Continue with current appointments scheduled, per 08/23/16 los. Patient was given a copy of the appointment schedule and AVS report, per 08/23/16 los.

## 2016-08-25 ENCOUNTER — Other Ambulatory Visit: Payer: Self-pay | Admitting: *Deleted

## 2016-08-25 ENCOUNTER — Encounter: Payer: Self-pay | Admitting: Radiation Oncology

## 2016-08-25 ENCOUNTER — Ambulatory Visit
Admission: RE | Admit: 2016-08-25 | Discharge: 2016-08-25 | Disposition: A | Discharge status: Home / Self Care | Payer: 59 | Source: Ambulatory Visit | Attending: Radiation Oncology | Admitting: Radiation Oncology

## 2016-08-25 ENCOUNTER — Telehealth: Payer: Self-pay | Admitting: Hematology

## 2016-08-25 ENCOUNTER — Telehealth: Payer: Self-pay | Admitting: *Deleted

## 2016-08-25 VITALS — BP 118/82 | HR 90 | Resp 18 | Wt 108.4 lb

## 2016-08-25 DIAGNOSIS — C7949 Secondary malignant neoplasm of other parts of nervous system: Secondary | ICD-10-CM

## 2016-08-25 DIAGNOSIS — Z5111 Encounter for antineoplastic chemotherapy: Secondary | ICD-10-CM | POA: Diagnosis not present

## 2016-08-25 DIAGNOSIS — C155 Malignant neoplasm of lower third of esophagus: Secondary | ICD-10-CM

## 2016-08-25 MED FILL — SENNA-GRX 8.8 MG/5ML SYRP: 8.8 | 23 days supply | Qty: 236 | Fill #0

## 2016-08-25 MED FILL — oxyCODONE HCL 5 MG/5ML SOLN: 5 | 4 days supply | Qty: 473 | Fill #0

## 2016-08-25 MED FILL — MAGIC MOUTHWASH W/LIDO 1:1: 12 days supply | Qty: 250 | Fill #0

## 2016-08-25 NOTE — Telephone Encounter (Signed)
Received call from [facility].  Per RPh, new rx for magic mouthwash does not specify if antifungal and/or steriod is necessary.  Most recent office note incomplete.  This RN unsure if antifungal/steroid are necessary.  Per RPh, antifungal and steroid will be added since MD out of town and unable to answer question.  Will inquire again if rx needs to be refilled.

## 2016-08-25 NOTE — Telephone Encounter (Signed)
ADDED LAB FOR 12/26. SPOKE WITH PATIENT HE IS AWARE OF LAB/XRT/INFUSION.

## 2016-08-25 NOTE — Progress Notes (Signed)
  Radiation Oncology         [phone]   Name: Samuel Henderson MRN: [id number]   Date: 08/25/2016  DOB: 1961/02/24   Weekly Radiation Therapy Management    ICD-9-CM ICD-10-CM   1. T1 spinal metastasis (HCC) 198.3 C79.49     Current Dose: 24 Gy  Planned Dose:  30 Gy  Narrative The patient presents for routine under treatment assessment.  Weight and vitals stable. Reports bilateral humerus nerve pain is slightly better. Reports he is only taking Osmolite and water by mouth. Questions when he will be able have more use and straighten out the fingers of his left hand.  Set-up films were reviewed. The chart was checked.  Physical Findings  weight is 108 lb 6.4 oz (49.2 kg). His blood pressure is 118/82 and his pulse is 90. His respiration is 18 and oxygen saturation is 100%. . Weight essentially stable.  No significant changes.  Impression The patient is tolerating radiation.  Plan Continue treatment as planned. The patient will follow up in 1 month following completion of treatment.         Sheral Apley Tammi Klippel, M.D.  This document serves as a record of services personally performed by [name], MD. It was created on his behalf by Maryla Morrow, a trained medical scribe. The creation of this record is based on the scribe's personal observations and the provider's statements to them. This document has been checked and approved by the attending provider.

## 2016-08-25 NOTE — Progress Notes (Signed)
Weight and vitals stable. Reports bilateral humerus nerve pain is slightly better. Reports he is only taking Osmolite and water by mouth. Questions when he will be able have more use and straighten out the fingers of his left hand. Provided patient with one month follow up appointment card.   BP 118/82 (BP Location: Left Arm, Patient Position: Sitting, Cuff Size: Normal)   Pulse 90   Resp 18   Wt 108 lb 6.4 oz (49.2 kg)   SpO2 100%   BMI 16.01 kg/m  Wt Readings from Last 3 Encounters:  08/25/16 108 lb 6.4 oz (49.2 kg)  08/23/16 108 lb (49 kg)  08/21/16 110 lb 9.6 oz (50.2 kg)

## 2016-08-29 ENCOUNTER — Ambulatory Visit
Admission: RE | Admit: 2016-08-29 | Discharge: 2016-08-29 | Disposition: A | Discharge status: Home / Self Care | Payer: 59 | Source: Ambulatory Visit | Attending: Radiation Oncology | Admitting: Radiation Oncology

## 2016-08-29 ENCOUNTER — Ambulatory Visit (HOSPITAL_BASED_OUTPATIENT_CLINIC_OR_DEPARTMENT_OTHER): Payer: 59

## 2016-08-29 ENCOUNTER — Other Ambulatory Visit (HOSPITAL_BASED_OUTPATIENT_CLINIC_OR_DEPARTMENT_OTHER): Payer: 59

## 2016-08-29 ENCOUNTER — Other Ambulatory Visit: Payer: Self-pay | Admitting: *Deleted

## 2016-08-29 VITALS — BP 121/80 | HR 90 | Temp 98.2°F | Resp 20

## 2016-08-29 DIAGNOSIS — C159 Malignant neoplasm of esophagus, unspecified: Secondary | ICD-10-CM

## 2016-08-29 DIAGNOSIS — C155 Malignant neoplasm of lower third of esophagus: Secondary | ICD-10-CM

## 2016-08-29 DIAGNOSIS — Z7189 Other specified counseling: Secondary | ICD-10-CM

## 2016-08-29 DIAGNOSIS — C787 Secondary malignant neoplasm of liver and intrahepatic bile duct: Secondary | ICD-10-CM | POA: Diagnosis not present

## 2016-08-29 DIAGNOSIS — Z5111 Encounter for antineoplastic chemotherapy: Secondary | ICD-10-CM | POA: Diagnosis present

## 2016-08-29 DIAGNOSIS — C7951 Secondary malignant neoplasm of bone: Secondary | ICD-10-CM

## 2016-08-29 LAB — CBC WITH DIFFERENTIAL/PLATELET
BASO%: 0.5 % (ref 0.0–2.0)
BASOS ABS: 0.1 10*3/uL (ref 0.0–0.1)
EOS ABS: 0.1 10*3/uL (ref 0.0–0.5)
EOS%: 0.5 % (ref 0.0–7.0)
HEMATOCRIT: 33.4 % — AB (ref 38.4–49.9)
HEMOGLOBIN: 10.9 g/dL — AB (ref 13.0–17.1)
LYMPH#: 0.9 10*3/uL (ref 0.9–3.3)
LYMPH%: 8.6 % — ABNORMAL LOW (ref 14.0–49.0)
MCH: 28.8 pg (ref 27.2–33.4)
MCHC: 32.7 g/dL (ref 32.0–36.0)
MCV: 88.1 fL (ref 79.3–98.0)
MONO#: 1.3 10*3/uL — ABNORMAL HIGH (ref 0.1–0.9)
MONO%: 12.1 % (ref 0.0–14.0)
NEUT%: 78.3 % — ABNORMAL HIGH (ref 39.0–75.0)
NEUTROS ABS: 8.3 10*3/uL — AB (ref 1.5–6.5)
Platelets: 461 10*3/uL — ABNORMAL HIGH (ref 140–400)
RBC: 3.79 10*6/uL — ABNORMAL LOW (ref 4.20–5.82)
RDW: 17.8 % — AB (ref 11.0–14.6)
WBC: 10.7 10*3/uL — ABNORMAL HIGH (ref 4.0–10.3)

## 2016-08-29 LAB — COMPREHENSIVE METABOLIC PANEL
ALBUMIN: 3.3 g/dL — AB (ref 3.5–5.0)
ALK PHOS: 191 U/L — AB (ref 40–150)
ALT: 23 U/L (ref 0–55)
AST: 27 U/L (ref 5–34)
Anion Gap: 11 mEq/L (ref 3–11)
BILIRUBIN TOTAL: 0.57 mg/dL (ref 0.20–1.20)
BUN: 12.4 mg/dL (ref 7.0–26.0)
CALCIUM: 9.6 mg/dL (ref 8.4–10.4)
CO2: 30 mEq/L — ABNORMAL HIGH (ref 22–29)
Chloride: 93 mEq/L — ABNORMAL LOW (ref 98–109)
Creatinine: 0.7 mg/dL (ref 0.7–1.3)
GLUCOSE: 121 mg/dL (ref 70–140)
POTASSIUM: 4.2 meq/L (ref 3.5–5.1)
SODIUM: 133 meq/L — AB (ref 136–145)
TOTAL PROTEIN: 7.9 g/dL (ref 6.4–8.3)

## 2016-08-29 MED ORDER — ATROPINE SULFATE 1 MG/ML IJ SOLN
0.4000 mg | Freq: Once | INTRAMUSCULAR | Status: AC
  Administered 2016-08-29: 0.4 mg via INTRAVENOUS

## 2016-08-29 MED ORDER — CAPECITABINE 500 MG PO TABS: 1000.0000 mg/m2 | ORAL_TABLET | Freq: Two times a day (BID) | ORAL | 2 refills | Status: DC

## 2016-08-29 MED ORDER — ACETAMINOPHEN 160 MG/5ML PO SOLN
650.0000 mg | Freq: Once | ORAL | Status: AC
  Administered 2016-08-29: 650 mg via ORAL
  Filled 2016-08-29: qty 20.3

## 2016-08-29 MED ORDER — ATROPINE SULFATE 1 MG/ML IJ SOLN
INTRAMUSCULAR | Status: AC
  Filled 2016-08-29: qty 1

## 2016-08-29 MED ORDER — HEPARIN SOD (PORK) LOCK FLUSH 100 UNIT/ML IV SOLN
500.0000 [IU] | Freq: Once | INTRAVENOUS | Status: AC | PRN
  Administered 2016-08-29: 500 [IU]
  Filled 2016-08-29: qty 5

## 2016-08-29 MED ORDER — ACETAMINOPHEN 325 MG PO TABS
ORAL_TABLET | ORAL | Status: AC
  Filled 2016-08-29: qty 2

## 2016-08-29 MED ORDER — SODIUM CHLORIDE 0.9 % IV SOLN
Freq: Once | INTRAVENOUS | Status: AC
  Administered 2016-08-29: 15:00:00 via INTRAVENOUS

## 2016-08-29 MED ORDER — DEXAMETHASONE SODIUM PHOSPHATE 10 MG/ML IJ SOLN
INTRAMUSCULAR | Status: AC
  Filled 2016-08-29: qty 1

## 2016-08-29 MED ORDER — DEXAMETHASONE SODIUM PHOSPHATE 10 MG/ML IJ SOLN
10.0000 mg | Freq: Once | INTRAMUSCULAR | Status: AC
  Administered 2016-08-29: 10 mg via INTRAVENOUS

## 2016-08-29 MED ORDER — CAPECITABINE 500 MG PO TABS
1000.0000 mg/m2 | ORAL_TABLET | Freq: Two times a day (BID) | ORAL | 2 refills | Status: DC
Start: 2016-08-29 — End: 2016-08-29

## 2016-08-29 MED ORDER — PALONOSETRON HCL INJECTION 0.25 MG/5ML
INTRAVENOUS | Status: AC
  Filled 2016-08-29: qty 5

## 2016-08-29 MED ORDER — DENOSUMAB 120 MG/1.7ML ~~LOC~~ SOLN
120.0000 mg | Freq: Once | SUBCUTANEOUS | Status: AC
  Administered 2016-08-29: 120 mg via SUBCUTANEOUS
  Filled 2016-08-29: qty 1.7

## 2016-08-29 MED ORDER — IRINOTECAN HCL CHEMO INJECTION 100 MG/5ML
250.0000 mg/m2 | Freq: Once | INTRAVENOUS | Status: AC
  Administered 2016-08-29: 400 mg via INTRAVENOUS
  Filled 2016-08-29: qty 20

## 2016-08-29 MED ORDER — PALONOSETRON HCL INJECTION 0.25 MG/5ML
0.2500 mg | Freq: Once | INTRAVENOUS | Status: AC
  Administered 2016-08-29: 0.25 mg via INTRAVENOUS

## 2016-08-29 MED ORDER — SODIUM CHLORIDE 0.9% FLUSH
10.0000 mL | INTRAVENOUS | Status: DC | PRN
  Administered 2016-08-29: 10 mL
  Filled 2016-08-29: qty 10

## 2016-08-29 MED FILL — CAPECITABINE 500 MG TABLET: 500 | 14 days supply | Qty: 84 | Fill #0

## 2016-08-29 NOTE — Progress Notes (Signed)
Samuel Henderson    HEMATOLOGY/ONCOLOGY CLINIC NOTE  Date of Service: .08/23/2016    Patient Care Team: Leanna Battles, MD as PCP - General (Internal Medicine)  CHIEF COMPLAINTS/PURPOSE OF CONSULTATION:  Metastatic Esophageal Squamous cell carcinoma   HISTORY OF PRESENTING ILLNESS: plz see my initial consultation regarding patients initial presentation  INTERVAL HISTORY  Samuel Henderson is here for his scheduled follow-up. He notes that he tolerated his Xeloda without much issues but a couple of his last doses his tab got stuck and cause significant esophageal discomfort. We discussed option of doing 5FU but he does not want to do the pump. We discussed that though we generally avoid crushing Xeloda there is some evidence from [place] with regards to safety of dissolving the pills in water and administering through the tube. No hand foot syndrome. Get RT to his upper t-spine lesion. Fentanyl patch adjusted up and Oxycodone dose increased for breakthrough pain to optimize his cancer related pain. Again discussed goals of care ..patient wants to continue palliative ctx at this time.  MEDICAL HISTORY:  Past Medical History:  Diagnosis Date  . Arthritis   . Bone cancer ([place])   . Esophageal cancer ([place])   . History of chemotherapy   . History of radiation therapy   . Hypertension   . Knee pain     SURGICAL HISTORY: Past Surgical History:  Procedure Laterality Date  . ESOPHAGOGASTRODUODENOSCOPY (EGD) WITH ESOPHAGEAL DILATION     and biopsy  . IR GENERIC HISTORICAL  07/05/2016   IR GASTROSTOMY TUBE MOD SED 07/05/2016 [facility]  . IR GENERIC HISTORICAL  07/07/2016   IR RADIOLOGIST EVAL & MGMT 07/07/2016 [name], PA-C [facility]  . IR GENERIC HISTORICAL  07/20/2016   IR RADIOLOGIST EVAL & MGMT [facility]  . IRRIGATION AND DEBRIDEMENT KNEE Right 04/14/2016   Procedure: IRRIGATION AND DEBRIDEMENT RIGHT KNEE, ARTHROTOMY;  Surgeon: Paralee Cancel, MD;  Location: [facility];  Service:  Orthopedics;  Laterality: Right;  . KNEE SURGERY    . LESION REMOVAL  1969   hip    SOCIAL HISTORY: Social History   Social History  . Marital status: Single    Spouse name: N/A  . Number of children: 2  . Years of education: N/A   Occupational History  . Manufacturing industrial curtains    Social History Main Topics  . Smoking status: Current Every Day Smoker    Packs/day: 0.50    Years: 20.00    Types: Cigarettes  . Smokeless tobacco: Never Used  . Alcohol use No  . Drug use:     Types: Marijuana     Comment: last smoked 1 week ago  . Sexual activity: Yes   Other Topics Concern  . 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  FAMILY HISTORY: Family History  Problem Relation Age of Onset  . Esophageal cancer Father   . Cancer Father     nasopharyngeal   . Diabetes Sister   . Cancer Sister     multiple myeloma  . Colon cancer Neg Hx     ALLERGIES:  has No Known Allergies.  MEDICATIONS:  Current Outpatient Prescriptions  Medication Sig Dispense Refill  . amLODipine (NORVASC) 5 MG tablet Take 1 tablet (5 mg total) by mouth daily. 30 tablet 0  . capecitabine (XELODA) 500 MG tablet Place 3 tablets (1,500 mg total) into feeding tube 2 (two) times daily after a meal. (dissolve in 222m of water as instructed) 2 weeks on 1 week off 84 tablet 2  . docusate sodium (COLACE) 100 MG capsule Take 100 mg by mouth daily as needed for mild constipation.    . fentaNYL 37.5 MCG/HR PT72 Place 37.5 mcg onto the skin every 3 (three) days. 5 patch 0  . gabapentin (NEURONTIN) 100 MG capsule Take 1 capsule (100 mg total) by mouth 3 (three) times daily. 90 capsule 1    . lidocaine-prilocaine (EMLA) cream Apply 1 application topically once. 30 g 1  . Nutritional Supplements (FEEDING SUPPLEMENT, OSMOLITE 1.5 CAL,) LIQD Place 1,000 mLs into feeding tube 4 (four) times daily.    . ondansetron (ZOFRAN) 8 MG tablet Take 1 tablet (8 mg total) by mouth every 8 (eight) hours as needed for nausea or vomiting. 30 tablet 3  . oxyCODONE (ROXICODONE) 5 MG/5ML solution Take 10-20 mLs (10-20 mg total) by mouth every 4 (four) hours as needed for moderate pain or severe pain. 473 mL 0  . Water For Irrigation, Sterile (FREE WATER) SOLN Place 200 mLs into feeding tube 5 (five) times daily. 1000 mL 0  . fentaNYL (DURAGESIC - DOSED MCG/HR) 25 MCG/HR patch   0  . magic mouthwash w/lidocaine SOLN Take 5 mLs by mouth 4 (four) times daily as needed for mouth pain. 250 mL 1  . Sennosides (SENNA) 8.8 MG/5ML SYRP Take 5 mLs (8.8 mg total) by mouth 2 (two) times daily. 300 mL 1   No current facility-administered medications for this visit.     REVIEW OF SYSTEMS:    10 Point review of Systems was done is negative except as noted above.  PHYSICAL EXAMINATION: ECOG PERFORMANCE STATUS: 2  . Vitals:   08/23/16 1000  BP: 122/85  Pulse: 91  Resp: 18  Temp: 98.7 F (37.1 C)   Filed Weights   08/23/16 1000  Weight: 108 lb (49 kg)   .Body mass index is 15.95 kg/m.  .Samuel Henderson KitchenGENERAL:middle aged AAM ,alert, in no acute distress and comfortable SKIN: skin color, texture, turgor are normal, no rashes or significant lesions EYES: normal, conjunctiva are pink and non-injected, sclera clear OROPHARYNX:no exudate, no erythema and lips, buccal mucosa, and tongue normal  NECK: supple, no JVD, thyroid normal size, non-tender, without nodularity LYMPH:  no palpable lymphadenopathy in the cervical, axillary or inguinal LUNGS: clear to auscultation with normal respiratory effort HEART: regular rate & rhythm,  no murmurs and no lower extremity edema ABDOMEN: abdomen soft, non-tender, normoactive  bowel sounds  Musculoskeletal: no cyanosis of digits and no clubbing  PSYCH: alert & oriented x 3 with fluent speech NEURO: no focal motor/sensory deficits  LABORATORY DATA:  I have reviewed the data as listed. CBC Latest Ref Rng & Units 08/23/2016 08/07/2016 07/31/2016  WBC 4.0 - 10.3 10e3/uL 7.2 9.5 10.2  Hemoglobin 13.0 - 17.1 g/dL 10.9(L) 11.0(L) 8.1(L)  Hematocrit 38.4 - 49.9 % 34.3(L) 33.2(L) 24.8(L)  Platelets 140 - 400 10e3/uL 399 578(H) 597(H)    . CMP Latest Ref Rng & Units 08/23/2016 08/07/2016 07/31/2016  Glucose 70 - 140 mg/dl 97 102 130(H)  BUN 7.0 - 26.0 mg/dL 11.7 9.7 9  Creatinine 0.7 - 1.3 mg/dL 0.6(L) 0.6(L) 0.52(L)  Sodium 136 - 145 mEq/L 136 130(L) 130(L)  Potassium 3.5 - 5.1 mEq/L 3.9 4.3 4.4  Chloride 101 - 111 mmol/L - - 90(L)  CO2 22 - 29 mEq/L 31(H) 28 28  Calcium 8.4 - 10.4 mg/dL 9.8 10.0 9.3  Total Protein 6.4 - 8.3 g/dL 7.7 7.7 8.1  Total Bilirubin 0.20 - 1.20 mg/dL 0.46 0.47 1.0  Alkaline Phos 40 - 150 U/L 202(H) 249(H) 323(H)  AST 5 - 34 U/L 27 33 55(H)  ALT 0 - 55 U/L 24 17 19           Radiology  CT Chest/abd/pelvis 06/05/2016  IMPRESSION: 1. Significant interval progression of liver metastases. 2. New abnormal thickening of the esophagus which may reflect local tumor recurrence. 3. Stable lytic lesion involving the T1 vertebra and sclerotic lesion involving the L3 vertebra. 4. Similar appearance of gastrohepatic ligament lymph node.   Electronically Signed   By: Kerby Moors M.D.   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 S/p palliative radiation therapy to the esophagus and regional lymph nodes. He has tolerated treatment generally well except for some mild grade 2 radiation esophagitis which has  now resolved. Repeat CT chest abdomen pelvis done 11/17/2015 show progression of his liver metastases and gastrohepatic lymph node as expected.  He has completed his 6 cycles of carboplatin and Taxol  With grade 1-2 fatigue and grade 1 neuropathy CT c/a/p with stable improved disease with some  T1 metastasis, as before. Additional lesions in the L3 vertebral body and right sacrum, corresponding to abnormalities on 04/06/2016 and worrisome for metastatic disease.  CT CAP 06/05/2016 -- significant progression of liver mets and concern for new abnormal thickening of the esophagus -likely reflecting local tumor recurrence.  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 EGD done 06/26/2016 confirms local recurrence of SCC with PDL1 expression 0% 3) grade 1 radiation esophagitis -- in upper esophageal related to current RT ---magic mouthwash prescribed. 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 This has improved some and remains nonpainful. 8) neoplasm related pain especially from bone metastases in his lower back - controlled 9) neoplasm related pain in the upper thoracic spine T1 (bone mets). Radiating to bilateral upper extremities suggesting C7 nerve root involvement. -undergoing palliative RT for this 9) Liver mets 10)Bone mets Plan -given swallowing problems with Xeloda we will have him dissolve the Xeloda pills in water and administer using his g- tube - 2 weeks on /1 week off [email or web address]  -Irinotecan with appropriate premeds including atropine q3weeks --Optimizing tube feeding . Continue to follow up with Ernestene Kiel . -Encouraged to maintain mobility . -Increased fentanyl patch at 37.5 g/h  -Refill given on oxycodone - dose adjusted up to  10-50m q4h prn for breakthrough pain -magic mouthwash prn for radiation esophagitis -Continue Xgeva q4weeks -Rad onc f/u -goals of care discussion done.  RTC with Dr KIrene Limboin 2 weeks with labs   All of his questions and concerns were answered in detail.  I spent 30 minutes counseling the patient face  to face. The total time spent in the appointment was 40 minutes and more than 50% was on counseling and direct patient cares.    Sullivan Lone MD [place] AAHIVMS [facility] Hematology/Oncology Physician [facility]  (Office):       [phone] (Work cell):  [phone] (Fax):           [phone]

## 2016-08-29 NOTE — Patient Instructions (Signed)
[facility] Discharge Instructions for Patients Receiving Chemotherapy  Today you received the following chemotherapy agents camptosar.  You also received the xgeva injection today. To help prevent nausea and vomiting after your treatment, we encourage you to take your nausea medication.  Do not start using the zofran for nausea until Friday 09/01/16.  Until then you may use compazine for nausea.   If you develop nausea and vomiting that is not controlled by your nausea medication, call the clinic.   BELOW ARE SYMPTOMS THAT SHOULD BE REPORTED IMMEDIATELY:  *FEVER GREATER THAN 100.5 F  *CHILLS WITH OR WITHOUT FEVER  NAUSEA AND VOMITING THAT IS NOT CONTROLLED WITH YOUR NAUSEA MEDICATION  *UNUSUAL SHORTNESS OF BREATH  *UNUSUAL BRUISING OR BLEEDING  TENDERNESS IN MOUTH AND THROAT WITH OR WITHOUT PRESENCE OF ULCERS  *URINARY PROBLEMS  *BOWEL PROBLEMS  UNUSUAL RASH Items with * indicate a potential emergency and should be followed up as soon as possible.  Feel free to call the clinic you have any questions or concerns. The clinic phone number is [phone].  Please show the [place] at check-in to the Emergency Department and triage nurse.

## 2016-08-29 NOTE — Progress Notes (Signed)
Patient does not want ativan as one of his premeds at this point in time.

## 2016-08-30 ENCOUNTER — Ambulatory Visit
Admission: RE | Admit: 2016-08-30 | Discharge: 2016-08-30 | Disposition: A | Discharge status: Home / Self Care | Payer: 59 | Source: Ambulatory Visit | Attending: Radiation Oncology | Admitting: Radiation Oncology

## 2016-08-30 ENCOUNTER — Ambulatory Visit: Payer: 59

## 2016-08-30 ENCOUNTER — Encounter: Payer: Self-pay | Admitting: Radiation Oncology

## 2016-08-30 DIAGNOSIS — Z5111 Encounter for antineoplastic chemotherapy: Secondary | ICD-10-CM | POA: Diagnosis not present

## 2016-09-03 NOTE — Progress Notes (Signed)
  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   He had a sore throat.  Plan: The patient has completed radiation treatment. The patient will return to radiation oncology clinic for routine followup in one month. I advised him to call or return sooner if he has any questions or concerns related to his recovery or treatment. ________________________________  Sheral Apley. Tammi Klippel, M.D.

## 2016-09-05 ENCOUNTER — Other Ambulatory Visit: Payer: Self-pay | Admitting: *Deleted

## 2016-09-05 DIAGNOSIS — C155 Malignant neoplasm of lower third of esophagus: Secondary | ICD-10-CM

## 2016-09-06 ENCOUNTER — Encounter: Payer: Self-pay | Admitting: Hematology

## 2016-09-06 ENCOUNTER — Other Ambulatory Visit: Payer: Self-pay | Admitting: *Deleted

## 2016-09-06 ENCOUNTER — Ambulatory Visit: Payer: 59

## 2016-09-06 ENCOUNTER — Telehealth: Payer: Self-pay | Admitting: Hematology

## 2016-09-06 ENCOUNTER — Ambulatory Visit (HOSPITAL_BASED_OUTPATIENT_CLINIC_OR_DEPARTMENT_OTHER): Payer: Medicaid Other | Admitting: Hematology

## 2016-09-06 ENCOUNTER — Other Ambulatory Visit (HOSPITAL_BASED_OUTPATIENT_CLINIC_OR_DEPARTMENT_OTHER): Payer: Medicaid Other

## 2016-09-06 VITALS — BP 117/71 | HR 90 | Temp 98.2°F | Resp 18 | Ht 69.0 in | Wt 106.4 lb

## 2016-09-06 DIAGNOSIS — C155 Malignant neoplasm of lower third of esophagus: Secondary | ICD-10-CM

## 2016-09-06 DIAGNOSIS — C7951 Secondary malignant neoplasm of bone: Secondary | ICD-10-CM | POA: Diagnosis not present

## 2016-09-06 DIAGNOSIS — G893 Neoplasm related pain (acute) (chronic): Secondary | ICD-10-CM | POA: Diagnosis not present

## 2016-09-06 DIAGNOSIS — C787 Secondary malignant neoplasm of liver and intrahepatic bile duct: Secondary | ICD-10-CM

## 2016-09-06 LAB — CBC WITH DIFFERENTIAL/PLATELET
BASO%: 0.3 % (ref 0.0–2.0)
Basophils Absolute: 0 10e3/uL (ref 0.0–0.1)
EOS%: 2.6 % (ref 0.0–7.0)
Eosinophils Absolute: 0.1 10e3/uL (ref 0.0–0.5)
HCT: 31.6 % — ABNORMAL LOW (ref 38.4–49.9)
HGB: 10.3 g/dL — ABNORMAL LOW (ref 13.0–17.1)
LYMPH%: 24.3 % (ref 14.0–49.0)
MCH: 28.5 pg (ref 27.2–33.4)
MCHC: 32.6 g/dL (ref 32.0–36.0)
MCV: 87.5 fL (ref 79.3–98.0)
MONO#: 0.6 10e3/uL (ref 0.1–0.9)
MONO%: 18.1 % — ABNORMAL HIGH (ref 0.0–14.0)
NEUT#: 1.7 10e3/uL (ref 1.5–6.5)
NEUT%: 54.7 % (ref 39.0–75.0)
Platelets: 363 10e3/uL (ref 140–400)
RBC: 3.61 10e6/uL — ABNORMAL LOW (ref 4.20–5.82)
RDW: 18.2 % — ABNORMAL HIGH (ref 11.0–14.6)
WBC: 3.1 10e3/uL — ABNORMAL LOW (ref 4.0–10.3)
lymph#: 0.8 10e3/uL — ABNORMAL LOW (ref 0.9–3.3)

## 2016-09-06 LAB — COMPREHENSIVE METABOLIC PANEL
ALK PHOS: 165 U/L — AB (ref 40–150)
ALT: 19 U/L (ref 0–55)
ANION GAP: 11 meq/L (ref 3–11)
AST: 23 U/L (ref 5–34)
Albumin: 3.4 g/dL — ABNORMAL LOW (ref 3.5–5.0)
BUN: 10.8 mg/dL (ref 7.0–26.0)
CALCIUM: 9.6 mg/dL (ref 8.4–10.4)
CHLORIDE: 95 meq/L — AB (ref 98–109)
CO2: 30 mEq/L — ABNORMAL HIGH (ref 22–29)
CREATININE: 0.6 mg/dL — AB (ref 0.7–1.3)
EGFR: >90 mL/min/{1.73_m2} (ref >90)
Glucose: 94 mg/dl (ref 70–140)
Potassium: 4.4 mEq/L (ref 3.5–5.1)
Sodium: 135 mEq/L — ABNORMAL LOW (ref 136–145)
Total Bilirubin: 0.53 mg/dL (ref 0.20–1.20)
Total Protein: 7.2 g/dL (ref 6.4–8.3)

## 2016-09-06 MED ORDER — AMLODIPINE BESYLATE 5 MG PO TABS: 5.0000 mg | ORAL_TABLET | Freq: Every day | ORAL | 0 refills | Status: DC

## 2016-09-06 NOTE — Progress Notes (Signed)
[name] Kitchen    HEMATOLOGY/ONCOLOGY CLINIC NOTE  Date of Service: .09/06/2016    Patient Care Team: Leanna Battles, MD as PCP - General (Internal Medicine)  CHIEF COMPLAINTS/PURPOSE OF CONSULTATION:  Metastatic Esophageal Squamous cell carcinoma   HISTORY OF PRESENTING ILLNESS: plz see my initial consultation regarding patients initial presentation  INTERVAL HISTORY  Samuel Henderson is here for his scheduled follow-up for toxicity check and for symptom management. He tolerated his high-dose of Irinotecan without any significant issues. Tolerating the Xeloda dissolved in water. Is having a family member help with this but notes that the family member would be only available only till the end of the next cycle. Pain is much better controlled with adjustment to his pain medications during his last visit .  MEDICAL HISTORY:   Past Medical History:  Diagnosis Date  . Arthritis   . Bone cancer ([place])   . Esophageal cancer ([place])   . History of chemotherapy   . History of radiation therapy   . Hypertension   . Knee pain     SURGICAL HISTORY: Past Surgical History:  Procedure Laterality Date  . ESOPHAGOGASTRODUODENOSCOPY (EGD) WITH ESOPHAGEAL DILATION     and biopsy  . IR GENERIC HISTORICAL  07/05/2016   IR GASTROSTOMY TUBE MOD SED 07/05/2016 [facility]  . IR GENERIC HISTORICAL  07/07/2016   IR RADIOLOGIST EVAL & MGMT 07/07/2016 [name], PA-C [facility]  . IR GENERIC HISTORICAL  07/20/2016   IR RADIOLOGIST EVAL & MGMT [facility]  . IRRIGATION AND DEBRIDEMENT KNEE Right 04/14/2016   Procedure: IRRIGATION AND DEBRIDEMENT RIGHT KNEE, ARTHROTOMY;  Surgeon: Paralee Cancel, MD;  Location: [facility];  Service: Orthopedics;  Laterality: Right;  . KNEE SURGERY    . LESION REMOVAL  1969   hip   SOCIAL HISTORY: Social History   Social History  . Marital status: Single    Spouse name: N/A  . Number of children: 2  . Years of education: N/A   Occupational History  . Manufacturing  industrial curtains    Social History Main Topics  . Smoking status: Current Every Day Smoker    Packs/day: 0.50    Years: 20.00    Types: Cigarettes  . Smokeless tobacco: Never Used  . Alcohol use No  . Drug use:     Types: Marijuana     Comment: last smoked 1 week ago  . Sexual activity: Yes   Other Topics Concern  . 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 Sober for 15 yrs  Ex smoker 1/2 PPD x 26yr quit about 18 yrs ago.  FAMILY HISTORY: Family History  Problem Relation Age of Onset  . Esophageal cancer Father   . Cancer Father     nasopharyngeal   . Diabetes Sister   . Cancer Sister     multiple myeloma  . Colon cancer Neg Hx     ALLERGIES:  has No Known Allergies.  MEDICATIONS:  Current Outpatient Prescriptions  Medication Sig Dispense Refill  . capecitabine (XELODA) 500 MG tablet Place 3 tablets (1,500 mg total) into feeding tube 2 (two) times daily after a meal. (dissolve in 2039mof water as instructed) 2 weeks  on 1 week off 84 tablet 2  . docusate sodium (COLACE) 100 MG capsule Take 100 mg by mouth daily as needed for mild constipation.    . fentaNYL 37.5 MCG/HR PT72 Place 37.5 mcg onto the skin every 3 (three) days. 5 patch 0  . lidocaine-prilocaine (EMLA) cream Apply 1 application topically once. 30 g 1  . magic mouthwash w/lidocaine SOLN Take 5 mLs by mouth 4 (four) times daily as needed for mouth pain. 250 mL 1  . Nutritional Supplements (FEEDING SUPPLEMENT, OSMOLITE 1.5 CAL,) LIQD Place 1,000 mLs into feeding tube 4 (four) times daily.    . ondansetron (ZOFRAN) 8 MG tablet Take 1 tablet (8 mg total) by mouth every 8 (eight) hours as needed for nausea or vomiting. 30 tablet 3  . oxyCODONE (ROXICODONE) 5  MG/5ML solution Take 10-20 mLs (10-20 mg total) by mouth every 4 (four) hours as needed for moderate pain or severe pain. 473 mL 0  . Sennosides (SENNA) 8.8 MG/5ML SYRP Take 5 mLs (8.8 mg total) by mouth 2 (two) times daily. 300 mL 1  . Water For Irrigation, Sterile (FREE WATER) SOLN Place 200 mLs into feeding tube 5 (five) times daily. 1000 mL 0   No current facility-administered medications for this visit.     REVIEW OF SYSTEMS:    10 Point review of Systems was done is negative except as noted above.  PHYSICAL EXAMINATION: ECOG PERFORMANCE STATUS: 2  . Vitals:   09/06/16 0918  BP: 117/71  Pulse: 90  Resp: 18  Temp: 98.2 F (36.8 C)   Filed Weights   09/06/16 0918  Weight: 106 lb 6.4 oz (48.3 kg)   .Body mass index is 15.71 kg/m.  [name] Kitchen GENERAL:middle aged AAM ,alert, in no acute distress and comfortable SKIN: skin color, texture, turgor are normal, no rashes or significant lesions EYES: normal, conjunctiva are pink and non-injected, sclera clear OROPHARYNX:no exudate, no erythema and lips, buccal mucosa, and tongue normal  NECK: supple, no JVD, thyroid normal size, non-tender, without nodularity LYMPH:  no palpable lymphadenopathy in the cervical, axillary or inguinal LUNGS: clear to auscultation with normal respiratory effort HEART: regular rate & rhythm,  no murmurs and no lower extremity edema ABDOMEN: abdomen soft, non-tender, normoactive bowel sounds  Musculoskeletal: no cyanosis of digits and no clubbing  PSYCH: alert & oriented x 3 with fluent speech NEURO: no focal motor/sensory deficits  LABORATORY DATA:  I have reviewed the data as listed. CBC Latest Ref Rng & Units 09/06/2016 08/29/2016 08/23/2016  WBC 4.0 - 10.3 10e3/uL 3.1(L) 10.7(H) 7.2  Hemoglobin 13.0 - 17.1 g/dL 10.3(L) 10.9(L) 10.9(L)  Hematocrit 38.4 - 49.9 % 31.6(L) 33.4(L) 34.3(L)  Platelets 140 - 400 10e3/uL 363 461(H) 399    . CMP Latest Ref Rng & Units 09/06/2016 08/29/2016 08/23/2016  Glucose  70 - 140 mg/dl 94 121 97  BUN 7.0 - 26.0 mg/dL 10.8 12.4 11.7  Creatinine 0.7 - 1.3 mg/dL 0.6(L) 0.7 0.6(L)  Sodium 136 - 145 mEq/L 135(L) 133(L) 136  Potassium 3.5 - 5.1 mEq/L 4.4 4.2 3.9  Chloride 101 - 111 mmol/L - - -  CO2 22 - 29 mEq/L 30(H) 30(H) 31(H)  Calcium 8.4 - 10.4 mg/dL 9.6 9.6 9.8  Total Protein 6.4 - 8.3 g/dL 7.2 7.9 7.7  Total Bilirubin 0.20 - 1.20 mg/dL 0.53 0.57 0.46  Alkaline Phos 40 - 150 U/L 165(H) 191(H) 202(H)  AST 5 - 34 U/L 23 27 27   ALT 0 - 55 U/L 19 23  24          Radiology  CT Chest/abd/pelvis 06/05/2016  IMPRESSION: 1. Significant interval progression of liver metastases. 2. New abnormal thickening of the esophagus which may reflect local tumor recurrence. 3. Stable lytic lesion involving the T1 vertebra and sclerotic lesion involving the L3 vertebra. 4. Similar appearance of gastrohepatic ligament lymph node.   Electronically Signed   By: Kerby Moors M.D.   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 S/p palliative radiation therapy to the esophagus and regional lymph nodes. He has tolerated treatment generally well except for some mild grade 2 radiation esophagitis which has now resolved. Repeat CT chest abdomen pelvis done 11/17/2015 show progression of his liver metastases and gastrohepatic lymph node as expected.  He has completed his 6 cycles of carboplatin and Taxol  With grade 1-2 fatigue and grade 1 neuropathy CT c/a/p with stable improved disease with some  T1 metastasis, as before. Additional lesions in the L3 vertebral body and right sacrum, corresponding to abnormalities on 04/06/2016 and worrisome for metastatic disease.  CT CAP 06/05/2016 -- significant progression of liver mets and concern for new  abnormal thickening of the esophagus -likely reflecting local tumor recurrence.  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 EGD done 06/26/2016 confirms local recurrence of SCC with PDL1 expression 0% 3) grade 1 radiation esophagitis -- in upper esophageal related to current RT ---imrpoved - cont magic mouthwash prescribed. 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 This has improved some and remains nonpainful. 8) neoplasm related pain especially from bone metastases in his lower back - controlled better with adjustment of his pain medications 9) neoplasm related pain in the upper thoracic spine T1 (bone mets). Radiating to bilateral upper extremities suggesting C7 nerve root involvement. -completed palliative RT for this 10)Bone mets Plan -given swallowing problems with Xeloda we will have him dissolve the Xeloda pills in water and administer using his g- tube - 2 weeks on /1 week off. He is having a family member help with this with appropriate precautions. -Irinotecan with appropriate premeds including atropine q3weeks -Optimizing tube feeding . Continue to follow up with Ernestene Kiel . -Encouraged to maintain mobility . -continue fentanyl patch at 37.5 g/h and oxycodone 10-65m q4h prn for breakthrough pain -magic mouthwash prn for radiation esophagitis -Continue XMarchelle Folksfor bone mets -we will rpt CT Chest/abd/pelvis after next cycle of treatment unless any new symptoms arise.   RTC with Dr KIrene Limboin 2 weeks with labs C2D1 of XELIRI   All of his questions and concerns were answered in detail.  I spent 20 minutes counseling the patient face to face. The total time spent in the appointment was 25 minutes and more than 50% was on counseling and direct patient cares.    GSullivan LoneMD MVaughnAAHIVMS SSouthern [facility] Lake Surgery CenterCBaptist Health Medical Center - Fort SmithHematology/Oncology Physician [facility]Gr[facility] (Office):       [phone](Work cell):  [phone](Fax):           [phone]

## 2016-09-06 NOTE — Telephone Encounter (Signed)
MD appointment for 1/16 double book per desk nurse.

## 2016-09-06 NOTE — Telephone Encounter (Signed)
Appointments scheduled per 1/3 LOS. Patient given AVS report and calendars with future scheduled appointments. °

## 2016-09-07 ENCOUNTER — Ambulatory Visit: Payer: 59

## 2016-09-18 ENCOUNTER — Other Ambulatory Visit: Payer: Self-pay | Admitting: *Deleted

## 2016-09-18 DIAGNOSIS — C159 Malignant neoplasm of esophagus, unspecified: Secondary | ICD-10-CM

## 2016-09-19 ENCOUNTER — Ambulatory Visit: Payer: Medicaid Other | Admitting: Nutrition

## 2016-09-19 ENCOUNTER — Other Ambulatory Visit: Payer: Self-pay | Admitting: *Deleted

## 2016-09-19 ENCOUNTER — Other Ambulatory Visit (HOSPITAL_BASED_OUTPATIENT_CLINIC_OR_DEPARTMENT_OTHER): Payer: Medicaid Other

## 2016-09-19 ENCOUNTER — Telehealth: Payer: Self-pay | Admitting: Hematology

## 2016-09-19 ENCOUNTER — Ambulatory Visit (HOSPITAL_BASED_OUTPATIENT_CLINIC_OR_DEPARTMENT_OTHER): Payer: Medicaid Other

## 2016-09-19 ENCOUNTER — Encounter: Payer: Self-pay | Admitting: Hematology

## 2016-09-19 ENCOUNTER — Ambulatory Visit (HOSPITAL_BASED_OUTPATIENT_CLINIC_OR_DEPARTMENT_OTHER): Payer: Medicaid Other | Admitting: Hematology

## 2016-09-19 ENCOUNTER — Telehealth: Payer: Self-pay | Admitting: *Deleted

## 2016-09-19 VITALS — BP 115/81 | HR 84 | Temp 98.7°F | Resp 18 | Ht 69.0 in | Wt 108.3 lb

## 2016-09-19 DIAGNOSIS — E440 Moderate protein-calorie malnutrition: Secondary | ICD-10-CM | POA: Diagnosis not present

## 2016-09-19 DIAGNOSIS — C155 Malignant neoplasm of lower third of esophagus: Secondary | ICD-10-CM | POA: Diagnosis not present

## 2016-09-19 DIAGNOSIS — Z7189 Other specified counseling: Secondary | ICD-10-CM

## 2016-09-19 DIAGNOSIS — C159 Malignant neoplasm of esophagus, unspecified: Secondary | ICD-10-CM

## 2016-09-19 DIAGNOSIS — C7951 Secondary malignant neoplasm of bone: Secondary | ICD-10-CM

## 2016-09-19 DIAGNOSIS — C787 Secondary malignant neoplasm of liver and intrahepatic bile duct: Secondary | ICD-10-CM

## 2016-09-19 DIAGNOSIS — Z5111 Encounter for antineoplastic chemotherapy: Secondary | ICD-10-CM | POA: Diagnosis present

## 2016-09-19 LAB — CBC WITH DIFFERENTIAL/PLATELET
BASO%: 0.8 % (ref 0.0–2.0)
Basophils Absolute: 0 10*3/uL (ref 0.0–0.1)
EOS%: 1 % (ref 0.0–7.0)
Eosinophils Absolute: 0 10*3/uL (ref 0.0–0.5)
HCT: 32.4 % — ABNORMAL LOW (ref 38.4–49.9)
HEMOGLOBIN: 10.7 g/dL — AB (ref 13.0–17.1)
LYMPH%: 18.1 % (ref 14.0–49.0)
MCH: 30 pg (ref 27.2–33.4)
MCHC: 33.2 g/dL (ref 32.0–36.0)
MCV: 90.3 fL (ref 79.3–98.0)
MONO#: 0.6 10*3/uL (ref 0.1–0.9)
MONO%: 14 % (ref 0.0–14.0)
NEUT%: 66.1 % (ref 39.0–75.0)
NEUTROS ABS: 2.9 10*3/uL (ref 1.5–6.5)
PLATELETS: 444 10*3/uL — AB (ref 140–400)
RBC: 3.59 10*6/uL — AB (ref 4.20–5.82)
RDW: 23.6 % — AB (ref 11.0–14.6)
WBC: 4.4 10*3/uL (ref 4.0–10.3)
lymph#: 0.8 10*3/uL — ABNORMAL LOW (ref 0.9–3.3)

## 2016-09-19 LAB — COMPREHENSIVE METABOLIC PANEL
ALT: 23 U/L (ref 0–55)
ANION GAP: 9 meq/L (ref 3–11)
AST: 27 U/L (ref 5–34)
Albumin: 3.4 g/dL — ABNORMAL LOW (ref 3.5–5.0)
Alkaline Phosphatase: 153 U/L — ABNORMAL HIGH (ref 40–150)
BILIRUBIN TOTAL: 0.62 mg/dL (ref 0.20–1.20)
BUN: 10.2 mg/dL (ref 7.0–26.0)
CO2: 29 meq/L (ref 22–29)
CREATININE: 0.6 mg/dL — AB (ref 0.7–1.3)
Calcium: 9.6 mg/dL (ref 8.4–10.4)
Chloride: 98 mEq/L (ref 98–109)
EGFR: >90 mL/min/{1.73_m2} (ref >90)
GLUCOSE: 100 mg/dL (ref 70–140)
Potassium: 4.2 mEq/L (ref 3.5–5.1)
SODIUM: 136 meq/L (ref 136–145)
TOTAL PROTEIN: 7.4 g/dL (ref 6.4–8.3)

## 2016-09-19 MED ORDER — SODIUM CHLORIDE 0.9% FLUSH
10.0000 mL | INTRAVENOUS | Status: DC | PRN
  Administered 2016-09-19: 10 mL
  Filled 2016-09-19: qty 10

## 2016-09-19 MED ORDER — LORAZEPAM 2 MG/ML IJ SOLN
INTRAMUSCULAR | Status: AC
  Filled 2016-09-19: qty 1

## 2016-09-19 MED ORDER — HEPARIN SOD (PORK) LOCK FLUSH 100 UNIT/ML IV SOLN
500.0000 [IU] | Freq: Once | INTRAVENOUS | Status: AC | PRN
  Administered 2016-09-19: 500 [IU]
  Filled 2016-09-19: qty 5

## 2016-09-19 MED ORDER — ACETAMINOPHEN 325 MG PO TABS
ORAL_TABLET | ORAL | Status: AC
  Filled 2016-09-19: qty 2

## 2016-09-19 MED ORDER — OXYCODONE HCL 5 MG/5ML PO SOLN: 10.0000 mg | ORAL | 0 refills | Status: DC | PRN

## 2016-09-19 MED ORDER — ACETAMINOPHEN 160 MG/5ML PO SOLN
650.0000 mg | Freq: Once | ORAL | Status: AC
  Administered 2016-09-19: 650 mg via ORAL
  Filled 2016-09-19: qty 20.3

## 2016-09-19 MED ORDER — ATROPINE SULFATE 1 MG/ML IJ SOLN
0.5000 mg | Freq: Once | INTRAMUSCULAR | Status: AC | PRN
  Administered 2016-09-19: 0.5 mg via INTRAVENOUS

## 2016-09-19 MED ORDER — DEXAMETHASONE SODIUM PHOSPHATE 10 MG/ML IJ SOLN
INTRAMUSCULAR | Status: AC
  Filled 2016-09-19: qty 1

## 2016-09-19 MED ORDER — IRINOTECAN HCL CHEMO INJECTION 100 MG/5ML
250.0000 mg/m2 | Freq: Once | INTRAVENOUS | Status: AC
  Administered 2016-09-19: 400 mg via INTRAVENOUS
  Filled 2016-09-19: qty 15

## 2016-09-19 MED ORDER — CAPECITABINE 500 MG PO TABS
1000.0000 mg/m2 | ORAL_TABLET | Freq: Two times a day (BID) | ORAL | 2 refills | Status: DC
Start: 2016-09-19 — End: 2016-11-07

## 2016-09-19 MED ORDER — ATROPINE SULFATE 1 MG/ML IJ SOLN
INTRAMUSCULAR | Status: AC
  Filled 2016-09-19: qty 1

## 2016-09-19 MED ORDER — SODIUM CHLORIDE 0.9 % IV SOLN
Freq: Once | INTRAVENOUS | Status: AC
  Administered 2016-09-19: 12:00:00 via INTRAVENOUS

## 2016-09-19 MED ORDER — DEXAMETHASONE SODIUM PHOSPHATE 10 MG/ML IJ SOLN
10.0000 mg | Freq: Once | INTRAMUSCULAR | Status: AC
  Administered 2016-09-19: 10 mg via INTRAVENOUS

## 2016-09-19 MED ORDER — LORAZEPAM 2 MG/ML IJ SOLN
0.5000 mg | Freq: Once | INTRAMUSCULAR | Status: AC
  Administered 2016-09-19: 0.5 mg via INTRAVENOUS

## 2016-09-19 MED ORDER — PALONOSETRON HCL INJECTION 0.25 MG/5ML
INTRAVENOUS | Status: AC
  Filled 2016-09-19: qty 5

## 2016-09-19 MED ORDER — PALONOSETRON HCL INJECTION 0.25 MG/5ML
0.2500 mg | Freq: Once | INTRAVENOUS | Status: AC
  Administered 2016-09-19: 0.25 mg via INTRAVENOUS

## 2016-09-19 MED FILL — oxyCODONE HCL 5 MG/5ML SOLN: 5 | 4 days supply | Qty: 473 | Fill #0

## 2016-09-19 MED FILL — CAPECITABINE 500 MG TABLET: 500 | 14 days supply | Qty: 84 | Fill #1

## 2016-09-19 NOTE — Progress Notes (Signed)
[name] Kitchen    HEMATOLOGY/ONCOLOGY CLINIC NOTE  Date of Service: .09/19/2016    Patient Care Team: Leanna Battles, MD as PCP - General (Internal Medicine)  CHIEF COMPLAINTS/PURPOSE OF CONSULTATION:  Metastatic Esophageal Squamous cell carcinoma   HISTORY OF PRESENTING ILLNESS: plz see my initial consultation regarding patients initial presentation  INTERVAL HISTORY  [name] is here for his scheduled follow-up prior to his next cycle of Xeloda-Irinotecan chemotherapy. Notes no uncontrolled pain. Still with some weakness in his hands RT>Left. No nausea/vomiting. Weight remains stable from last visit. No other acute new focal symptoms.   MEDICAL HISTORY:   Past Medical History:  Diagnosis Date  . Arthritis   . Bone cancer ([place])   . Esophageal cancer ([place])   . History of chemotherapy   . History of radiation therapy   . Hypertension   . Knee pain     SURGICAL HISTORY: Past Surgical History:  Procedure Laterality Date  . ESOPHAGOGASTRODUODENOSCOPY (EGD) WITH ESOPHAGEAL DILATION     and biopsy  . IR GENERIC HISTORICAL  07/05/2016   IR GASTROSTOMY TUBE MOD SED 07/05/2016 [facility]  . IR GENERIC HISTORICAL  07/07/2016   IR RADIOLOGIST EVAL & MGMT 07/07/2016 [name], PA-C [facility]  . IR GENERIC HISTORICAL  07/20/2016   IR RADIOLOGIST EVAL & MGMT [facility]  . IRRIGATION AND DEBRIDEMENT KNEE Right 04/14/2016   Procedure: IRRIGATION AND DEBRIDEMENT RIGHT KNEE, ARTHROTOMY;  Surgeon: Paralee Cancel, MD;  Location: [facility];  Service: Orthopedics;  Laterality: Right;  . KNEE SURGERY    . LESION REMOVAL  1969   hip   SOCIAL HISTORY: Social History   Social History  . Marital status: Single    Spouse name: N/A  . Number of children: 2  . Years of education: N/A   Occupational History  . Manufacturing industrial curtains    Social History Main Topics  . Smoking status: Current Every Day Smoker    Packs/day: 0.50    Years: 20.00    Types: Cigarettes  .  Smokeless tobacco: Never Used  . Alcohol use No  . Drug use:     Types: Marijuana     Comment: last smoked 1 week ago  . Sexual activity: Yes   Other Topics Concern  . 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  FAMILY HISTORY: Family History  Problem Relation Age of Onset  . Esophageal cancer Father   . Cancer Father     nasopharyngeal   . Diabetes Sister   . Cancer Sister     multiple myeloma  . Colon cancer Neg Hx     ALLERGIES:  has No Known Allergies.  MEDICATIONS:  Current Outpatient Prescriptions  Medication Sig Dispense Refill  . docusate sodium (COLACE) 100 MG capsule Take 100 mg by mouth daily as needed for mild constipation.    . fentaNYL 37.5 MCG/HR PT72 Place 37.5 mcg onto the skin every 3 (three) days. 5 patch 0  . lidocaine-prilocaine (EMLA) cream Apply 1 application topically once. 30 g 1  . magic mouthwash w/lidocaine SOLN Take 5 mLs  by mouth 4 (four) times daily as needed for mouth pain. 250 mL 1  . Nutritional Supplements (FEEDING SUPPLEMENT, OSMOLITE 1.5 CAL,) LIQD Place 1,000 mLs into feeding tube 4 (four) times daily.    . ondansetron (ZOFRAN) 8 MG tablet Take 1 tablet (8 mg total) by mouth every 8 (eight) hours as needed for nausea or vomiting. 30 tablet 3  . Sennosides (SENNA) 8.8 MG/5ML SYRP Take 5 mLs (8.8 mg total) by mouth 2 (two) times daily. 300 mL 1  . Water For Irrigation, Sterile (FREE WATER) SOLN Place 200 mLs into feeding tube 5 (five) times daily. 1000 mL 0  . capecitabine (XELODA) 500 MG tablet Place 3 tablets (1,500 mg total) into feeding tube 2 (two) times daily after a meal. (dissolve in 257m of water as instructed) 2  weeks on 1 week off 84 tablet 2  . oxyCODONE (ROXICODONE) 5 MG/5ML solution Take 10-20 mLs (10-20 mg total) by mouth every 4 (four) hours as needed for moderate pain or severe pain. 473 mL 0   No current facility-administered medications for this visit.    Facility-Administered Medications Ordered in Other Visits  Medication Dose Route Frequency Provider Last Rate Last Dose  . sodium chloride flush (NS) 0.9 % injection 10 mL  10 mL Intracatheter PRN GBrunetta Genera MD   10 mL at 09/19/16 1352    REVIEW OF SYSTEMS:    10 Point review of Systems was done is negative except as noted above.  PHYSICAL EXAMINATION: ECOG PERFORMANCE STATUS: 2  . Vitals:   09/19/16 1104  BP: 115/81  Pulse: 84  Resp: 18  Temp: 98.7 F (37.1 C)   Filed Weights   09/19/16 1104  Weight: 108 lb 4.8 oz (49.1 kg)   .Body mass index is 15.99 kg/m.  .[name] KitchenGENERAL:middle aged AAM ,alert, in no acute distress and comfortable SKIN: skin color, texture, turgor are normal, no rashes or significant lesions EYES: normal, conjunctiva are pink and non-injected, sclera clear OROPHARYNX:no exudate, no erythema and lips, buccal mucosa, and tongue normal  NECK: supple, no JVD, thyroid normal size, non-tender, without nodularity LYMPH:  no palpable lymphadenopathy in the cervical, axillary or inguinal LUNGS: clear to auscultation with normal respiratory effort HEART: regular rate & rhythm,  no murmurs and no lower extremity edema ABDOMEN: abdomen soft, non-tender, normoactive bowel sounds  Musculoskeletal: no cyanosis of digits and no clubbing  PSYCH: alert & oriented x 3 with fluent speech NEURO: some weakness in his hands Rt>left  LABORATORY DATA:  I have reviewed the data as listed. CBC Latest Ref Rng & Units 09/19/2016 09/06/2016 08/29/2016  WBC 4.0 - 10.3 10e3/uL 4.4 3.1(L) 10.7(H)  Hemoglobin 13.0 - 17.1 g/dL 10.7(L) 10.3(L) 10.9(L)  Hematocrit 38.4 - 49.9 % 32.4(L) 31.6(L) 33.4(L)  Platelets 140 - 400  10e3/uL 444(H) 363 461(H)    . CMP Latest Ref Rng & Units 09/19/2016 09/06/2016 08/29/2016  Glucose 70 - 140 mg/dl 100 94 121  BUN 7.0 - 26.0 mg/dL 10.2 10.8 12.4  Creatinine 0.7 - 1.3 mg/dL 0.6(L) 0.6(L) 0.7  Sodium 136 - 145 mEq/L 136 135(L) 133(L)  Potassium 3.5 - 5.1 mEq/L 4.2 4.4 4.2  Chloride 101 - 111 mmol/L - - -  CO2 22 - 29 mEq/L 29 30(H) 30(H)  Calcium 8.4 - 10.4 mg/dL 9.6 9.6 9.6  Total Protein 6.4 - 8.3 g/dL 7.4 7.2 7.9  Total Bilirubin 0.20 - 1.20 mg/dL 0.62 0.53 0.57  Alkaline Phos 40 - 150 U/L 153(H) 165(H) 191(H)  AST 5 - 34 U/L 27 23 27   ALT 0 - 55 U/L 23 19 23           Radiology  CT Chest/abd/pelvis 06/05/2016  IMPRESSION: 1. Significant interval progression of liver metastases. 2. New abnormal thickening of the esophagus which may reflect local tumor recurrence. 3. Stable lytic lesion involving the T1 vertebra and sclerotic lesion involving the L3 vertebra. 4. Similar appearance of gastrohepatic ligament lymph node.   Electronically Signed   By: Kerby Moors M.D.   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 S/p palliative radiation therapy to the esophagus and regional lymph nodes. He has tolerated treatment generally well except for some mild grade 2 radiation esophagitis which has now resolved. Repeat CT chest abdomen pelvis done 11/17/2015 show progression of his liver metastases and gastrohepatic lymph node as expected.  He has completed his 6 cycles of carboplatin and Taxol  With grade 1-2 fatigue and grade 1 neuropathy CT c/a/p with stable improved disease with some  T1 metastasis, as before. Additional lesions in the L3 vertebral body and right sacrum, corresponding to abnormalities on 04/06/2016 and worrisome for  metastatic disease.  CT CAP 06/05/2016 -- significant progression of liver mets and concern for new abnormal thickening of the esophagus -likely reflecting local tumor recurrence.  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 EGD done 06/26/2016 confirms local recurrence of SCC with PDL1 expression 0% 3) grade 1 radiation esophagitis -- in upper esophageal related to current RT ---imrpoved - cont magic mouthwash prescribed. 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 This has improved some and remains nonpainful. 8) neoplasm related pain especially from bone metastases in his lower back - controlled better with adjustment of his pain medications 9) neoplasm related pain in the upper thoracic spine T1 (bone mets). Radiating to bilateral upper extremities suggesting C7 nerve root involvement. -completed palliative RT for this. 10)Bone mets  Plan -continue Xeloda (given swallowing problems with Xeloda we will have him dissolve the Xeloda pills in water and administer using his g- tube - 2 weeks on /1 week off. )He is having a family member help with this with appropriate precautions. -Irinotecan with appropriate premeds including atropine q3weeks -Optimize tube feeding as much as possible. . -Encouraged to maintain mobility . -continue fentanyl patch at 37.5 g/h and oxycodone 10-34m q4h prn for breakthrough pain (given refill on oxycodone) -magic mouthwash prn for radiation esophagitis -Continue Xgeva q4weeks for bone mets -we will rpt CT Chest/abd/pelvis a few days prior to next cycle of XELIRI in 3 weeks   Continue chemotherapy as per schedule in 3 weeks after today Continue Xgeva q4weeks CT chest abd pelvis in 2 weeks RTC with Dr KIrene Limbowith labs in 3 weeks and CT Chest/abd/pelvis  All  of his questions and concerns were answered in detail.  I spent 20 minutes counseling the patient face to face. The total time spent in the appointment was 25 minutes and more than 50% was on counseling and direct patient cares.    GSullivan LoneMD MLorainAAHIVMS SMethodist Medical Center Of Oak RidgeCSpringbrook HospitalHematology/Oncology Physician CBetsy Layne  [place]  (Office):       [phone] (Work cell):  [phone] (Fax):           [phone]

## 2016-09-19 NOTE — Telephone Encounter (Signed)
Appointments scheduled per 1/16 LOS. Patient given AVS report and calendars with future scheduled appointments. Patient given two bottles of contrast and instructions for CT scans.

## 2016-09-19 NOTE — Telephone Encounter (Signed)
Per chemo RN and care plan I changed 2/6 appt form injection to chemo

## 2016-09-19 NOTE — Progress Notes (Signed)
Nutrition follow-up completed with patient and sister during treatment for esophageal cancer. Weight decreased and documented as 108.3 pounds January 16, down from 114.4 pounds November 21. Patient has decreased Osmolite 1.5 to approximately 4 cans daily secondary to feeling too full after administering Xeloda through feeding tube.  Nutrition diagnosis: Unintended weight loss continues.  Intervention: I educated patient and family to give 1-1/2 cans Osmolite 1.5 - 4 times a day-spaced appropriately between Xeloda treatments. Reviewed instructions along with RN for giving Xeloda through feeding tube, which were given to me by pharmacist. It appears patient is giving Xeloda correctly. Provided a copy of instructions for patient to take with him. Questions were answered.  Teach back method used.  Monitoring, evaluation, goals:  Patient will tolerate tube feeding at goal rate to meet estimated needs to minimize weight loss.  Next visit: Tuesday, Feb 27, during infusion.  **Disclaimer: This note was dictated with voice recognition software. Similar sounding words can inadvertently be transcribed and this note may contain transcription errors which may not have been corrected upon publication of note.**

## 2016-09-19 NOTE — Patient Instructions (Signed)
[facility] Discharge Instructions for Patients Receiving Chemotherapy  Today you received the following chemotherapy agents Irinotecan  To help prevent nausea and vomiting after your treatment, we encourage you to take your nausea medication as directed.    If you develop nausea and vomiting that is not controlled by your nausea medication, call the clinic.   BELOW ARE SYMPTOMS THAT SHOULD BE REPORTED IMMEDIATELY:  *FEVER GREATER THAN 100.5 F  *CHILLS WITH OR WITHOUT FEVER  NAUSEA AND VOMITING THAT IS NOT CONTROLLED WITH YOUR NAUSEA MEDICATION  *UNUSUAL SHORTNESS OF BREATH  *UNUSUAL BRUISING OR BLEEDING  TENDERNESS IN MOUTH AND THROAT WITH OR WITHOUT PRESENCE OF ULCERS  *URINARY PROBLEMS  *BOWEL PROBLEMS  UNUSUAL RASH Items with * indicate a potential emergency and should be followed up as soon as possible.  Feel free to call the clinic you have any questions or concerns. The clinic phone number is [phone].  Please show the CHEMO ALERT CARD at check-in to the Emergency Department and triage nurse.   

## 2016-10-04 ENCOUNTER — Ambulatory Visit (HOSPITAL_BASED_OUTPATIENT_CLINIC_OR_DEPARTMENT_OTHER): Payer: Medicaid Other

## 2016-10-04 VITALS — BP 133/82 | HR 79 | Temp 98.1°F | Resp 20

## 2016-10-04 DIAGNOSIS — C7951 Secondary malignant neoplasm of bone: Secondary | ICD-10-CM

## 2016-10-04 DIAGNOSIS — Z7189 Other specified counseling: Secondary | ICD-10-CM

## 2016-10-04 DIAGNOSIS — C155 Malignant neoplasm of lower third of esophagus: Secondary | ICD-10-CM

## 2016-10-04 MED ORDER — DENOSUMAB 120 MG/1.7ML ~~LOC~~ SOLN
120.0000 mg | Freq: Once | SUBCUTANEOUS | Status: AC
  Administered 2016-10-04: 120 mg via SUBCUTANEOUS
  Filled 2016-10-04: qty 1.7

## 2016-10-04 NOTE — Patient Instructions (Signed)
Denosumab injection  What is this medicine?  DENOSUMAB (den oh sue mab) slows bone breakdown. Prolia is used to treat osteoporosis in women after menopause and in men. Xgeva is used to prevent bone fractures and other bone problems caused by cancer bone metastases. Xgeva is also used to treat giant cell tumor of the bone.  This medicine may be used for other purposes; ask your health care provider or pharmacist if you have questions.  What should I tell my health care provider before I take this medicine?  They need to know if you have any of these conditions:  -dental disease  -eczema  -infection or history of infections  -kidney disease or on dialysis  -low blood calcium or vitamin D  -malabsorption syndrome  -scheduled to have surgery or tooth extraction  -taking medicine that contains denosumab  -thyroid or parathyroid disease  -an unusual reaction to denosumab, other medicines, foods, dyes, or preservatives  -pregnant or trying to get pregnant  -breast-feeding  How should I use this medicine?  This medicine is for injection under the skin. It is given by a health care professional in a hospital or clinic setting.  If you are getting Prolia, a special MedGuide will be given to you by the pharmacist with each prescription and refill. Be sure to read this information carefully each time.  For Prolia, talk to your pediatrician regarding the use of this medicine in children. Special care may be needed. For Xgeva, talk to your pediatrician regarding the use of this medicine in children. While this drug may be prescribed for children as young as 13 years for selected conditions, precautions do apply.  Overdosage: If you think you have taken too much of this medicine contact a poison control center or emergency room at once.  NOTE: This medicine is only for you. Do not share this medicine with others.  What if I miss a dose?  It is important not to miss your dose. Call your doctor or health care professional if you are  unable to keep an appointment.  What may interact with this medicine?  Do not take this medicine with any of the following medications:  -other medicines containing denosumab  This medicine may also interact with the following medications:  -medicines that suppress the immune system  -medicines that treat cancer  -steroid medicines like prednisone or cortisone  This list may not describe all possible interactions. Give your health care provider a list of all the medicines, herbs, non-prescription drugs, or dietary supplements you use. Also tell them if you smoke, drink alcohol, or use illegal drugs. Some items may interact with your medicine.  What should I watch for while using this medicine?  Visit your doctor or health care professional for regular checks on your progress. Your doctor or health care professional may order blood tests and other tests to see how you are doing.  Call your doctor or health care professional if you get a cold or other infection while receiving this medicine. Do not treat yourself. This medicine may decrease your body's ability to fight infection.  You should make sure you get enough calcium and vitamin D while you are taking this medicine, unless your doctor tells you not to. Discuss the foods you eat and the vitamins you take with your health care professional.  See your dentist regularly. Brush and floss your teeth as directed. Before you have any dental work done, tell your dentist you are receiving this medicine.  Do   not become pregnant while taking this medicine or for 5 months after stopping it. Women should inform their doctor if they wish to become pregnant or think they might be pregnant. There is a potential for serious side effects to an unborn child. Talk to your health care professional or pharmacist for more information.  What side effects may I notice from receiving this medicine?  Side effects that you should report to your doctor or health care professional as soon as  possible:  -allergic reactions like skin rash, itching or hives, swelling of the face, lips, or tongue  -breathing problems  -chest pain  -fast, irregular heartbeat  -feeling faint or lightheaded, falls  -fever, chills, or any other sign of infection  -muscle spasms, tightening, or twitches  -numbness or tingling  -skin blisters or bumps, or is dry, peels, or red  -slow healing or unexplained pain in the mouth or jaw  -unusual bleeding or bruising  Side effects that usually do not require medical attention (Report these to your doctor or health care professional if they continue or are bothersome.):  -muscle pain  -stomach upset, gas  This list may not describe all possible side effects. Call your doctor for medical advice about side effects. You may report side effects to FDA at 1-800-FDA-1088.  Where should I keep my medicine?  This medicine is only given in a clinic, doctor's office, or other health care setting and will not be stored at home.  NOTE: This sheet is a summary. It may not cover all possible information. If you have questions about this medicine, talk to your doctor, pharmacist, or health care provider.      2016, [employer]. (2012-02-19 12:37:47)

## 2016-10-05 ENCOUNTER — Telehealth: Payer: Self-pay

## 2016-10-05 ENCOUNTER — Ambulatory Visit: Payer: 59

## 2016-10-05 NOTE — Telephone Encounter (Signed)
Pt called that his right wrist and hand are painful and swollen. Has been for 2 days. No know injury. No radiation up arm. Back of hand is swollen to his knuckles but not in fingers. It is sharp with motion, it can throb, it can be painless when relaxed and supported. Pt is using ice and keeping it elevated. He has used his roxicodone with some relief. It will interrupt his sleep. He has transportation issues. He does have a CT CAP scheduled at 11am tomorrow. He is asking what to do or can he be seen. Cyndee Berniece Salines is tentatively available in AM tomorrow. Please call him on his cell [phone].

## 2016-10-06 ENCOUNTER — Telehealth: Payer: Self-pay | Admitting: *Deleted

## 2016-10-06 ENCOUNTER — Encounter (HOSPITAL_COMMUNITY): Payer: Self-pay

## 2016-10-06 ENCOUNTER — Telehealth: Payer: Self-pay | Admitting: Hematology

## 2016-10-06 ENCOUNTER — Encounter: Payer: Self-pay | Admitting: Hematology

## 2016-10-06 ENCOUNTER — Other Ambulatory Visit (HOSPITAL_BASED_OUTPATIENT_CLINIC_OR_DEPARTMENT_OTHER): Payer: Medicaid Other

## 2016-10-06 ENCOUNTER — Ambulatory Visit (HOSPITAL_BASED_OUTPATIENT_CLINIC_OR_DEPARTMENT_OTHER): Payer: Medicaid Other | Admitting: Hematology

## 2016-10-06 ENCOUNTER — Ambulatory Visit (HOSPITAL_COMMUNITY)
Admission: RE | Admit: 2016-10-06 | Discharge: 2016-10-06 | Disposition: A | Discharge status: Home / Self Care | Payer: Medicaid Other | Source: Ambulatory Visit | Attending: Hematology | Admitting: Hematology

## 2016-10-06 VITALS — BP 135/98 | HR 94 | Temp 98.6°F | Resp 16 | Ht 69.0 in | Wt 107.5 lb

## 2016-10-06 DIAGNOSIS — C155 Malignant neoplasm of lower third of esophagus: Secondary | ICD-10-CM

## 2016-10-06 DIAGNOSIS — S2231XA Fracture of one rib, right side, initial encounter for closed fracture: Secondary | ICD-10-CM | POA: Diagnosis not present

## 2016-10-06 DIAGNOSIS — R600 Localized edema: Secondary | ICD-10-CM | POA: Diagnosis not present

## 2016-10-06 DIAGNOSIS — C7989 Secondary malignant neoplasm of other specified sites: Secondary | ICD-10-CM | POA: Insufficient documentation

## 2016-10-06 DIAGNOSIS — C7951 Secondary malignant neoplasm of bone: Secondary | ICD-10-CM | POA: Diagnosis not present

## 2016-10-06 DIAGNOSIS — C787 Secondary malignant neoplasm of liver and intrahepatic bile duct: Secondary | ICD-10-CM | POA: Diagnosis not present

## 2016-10-06 DIAGNOSIS — X58XXXA Exposure to other specified factors, initial encounter: Secondary | ICD-10-CM | POA: Diagnosis not present

## 2016-10-06 DIAGNOSIS — I2510 Atherosclerotic heart disease of native coronary artery without angina pectoris: Secondary | ICD-10-CM | POA: Diagnosis not present

## 2016-10-06 DIAGNOSIS — I313 Pericardial effusion (noninflammatory): Secondary | ICD-10-CM | POA: Insufficient documentation

## 2016-10-06 DIAGNOSIS — M109 Gout, unspecified: Secondary | ICD-10-CM | POA: Diagnosis not present

## 2016-10-06 DIAGNOSIS — R64 Cachexia: Secondary | ICD-10-CM | POA: Insufficient documentation

## 2016-10-06 DIAGNOSIS — M47896 Other spondylosis, lumbar region: Secondary | ICD-10-CM | POA: Insufficient documentation

## 2016-10-06 DIAGNOSIS — M5136 Other intervertebral disc degeneration, lumbar region: Secondary | ICD-10-CM | POA: Diagnosis not present

## 2016-10-06 DIAGNOSIS — I700 Atherosclerosis of aorta: Secondary | ICD-10-CM | POA: Insufficient documentation

## 2016-10-06 LAB — CBC & DIFF AND RETIC
BASO%: 0 % (ref 0.0–2.0)
BASOS ABS: 0 10*3/uL (ref 0.0–0.1)
EOS ABS: 0 10*3/uL (ref 0.0–0.5)
EOS%: 0.2 % (ref 0.0–7.0)
HEMATOCRIT: 28.8 % — AB (ref 38.4–49.9)
HEMOGLOBIN: 9.8 g/dL — AB (ref 13.0–17.1)
IMMATURE RETIC FRACT: 14.5 % — AB (ref 3.00–10.60)
LYMPH#: 0.9 10*3/uL (ref 0.9–3.3)
LYMPH%: 15.8 % (ref 14.0–49.0)
MCH: 30.3 pg (ref 27.2–33.4)
MCHC: 34 g/dL (ref 32.0–36.0)
MCV: 89.2 fL (ref 79.3–98.0)
MONO#: 1 10*3/uL — ABNORMAL HIGH (ref 0.1–0.9)
MONO%: 16.3 % — ABNORMAL HIGH (ref 0.0–14.0)
NEUT#: 3.9 10*3/uL (ref 1.5–6.5)
NEUT%: 67.7 % (ref 39.0–75.0)
PLATELETS: 304 10*3/uL (ref 140–400)
RBC: 3.23 10*6/uL — ABNORMAL LOW (ref 4.20–5.82)
RDW: 24.6 % — AB (ref 11.0–14.6)
RETIC %: 1.4 % (ref 0.80–1.80)
RETIC CT ABS: 45.22 10*3/uL (ref 34.80–93.90)
WBC: 5.8 10*3/uL (ref 4.0–10.3)

## 2016-10-06 LAB — COMPREHENSIVE METABOLIC PANEL
ALBUMIN: 3.5 g/dL (ref 3.5–5.0)
ALT: 13 U/L (ref 0–55)
ANION GAP: 9 meq/L (ref 3–11)
AST: 17 U/L (ref 5–34)
Alkaline Phosphatase: 116 U/L (ref 40–150)
BILIRUBIN TOTAL: 1.02 mg/dL (ref 0.20–1.20)
BUN: 9.8 mg/dL (ref 7.0–26.0)
CALCIUM: 9.2 mg/dL (ref 8.4–10.4)
CHLORIDE: 93 meq/L — AB (ref 98–109)
CO2: 29 mEq/L (ref 22–29)
CREATININE: 0.6 mg/dL — AB (ref 0.7–1.3)
EGFR: >90 mL/min/{1.73_m2} (ref >90)
Glucose: 98 mg/dl (ref 70–140)
Potassium: 3.7 mEq/L (ref 3.5–5.1)
Sodium: 131 mEq/L — ABNORMAL LOW (ref 136–145)
TOTAL PROTEIN: 7.4 g/dL (ref 6.4–8.3)

## 2016-10-06 LAB — MAGNESIUM: MAGNESIUM: 2 mg/dL (ref 1.5–2.5)

## 2016-10-06 MED ORDER — METHYLPREDNISOLONE SODIUM SUCC 125 MG IJ SOLR
INTRAMUSCULAR | Status: AC
  Filled 2016-10-06: qty 2

## 2016-10-06 MED ORDER — METHYLPREDNISOLONE SODIUM SUCC 125 MG IJ SOLR
80.0000 mg | Freq: Once | INTRAMUSCULAR | Status: AC
  Administered 2016-10-06: 80 mg via INTRAMUSCULAR

## 2016-10-06 MED ORDER — PREDNISONE 20 MG PO TABS: 40.0000 mg | ORAL_TABLET | Freq: Every day | ORAL | 0 refills | Status: AC

## 2016-10-06 MED ORDER — IOPAMIDOL (ISOVUE-300) INJECTION 61%
100.0000 mL | Freq: Once | INTRAVENOUS | Status: AC | PRN
  Administered 2016-10-06: 100 mL via INTRAVENOUS

## 2016-10-06 MED ORDER — SODIUM CHLORIDE 0.9 % IJ SOLN
INTRAMUSCULAR | Status: AC
  Filled 2016-10-06: qty 50

## 2016-10-06 MED ORDER — IOPAMIDOL (ISOVUE-300) INJECTION 61%
INTRAVENOUS | Status: AC
  Filled 2016-10-06: qty 100

## 2016-10-06 MED FILL — predniSONE 20 MG TABS: 20 | 5 days supply | Qty: 10 | Fill #0

## 2016-10-06 NOTE — Telephone Encounter (Signed)
LVM with pt stating Dr. Irene Limbo would like to see him back in the office next week to discuss results of CT scan.  Scheduling message sent to schedulers to set up apt.

## 2016-10-06 NOTE — Telephone Encounter (Signed)
sw pt to confirm 2/6 appt at 10 am to discuss CT results per LOS

## 2016-10-08 NOTE — Progress Notes (Signed)
Samuel Henderson    HEMATOLOGY/ONCOLOGY CLINIC NOTE  Date of Service: .10/06/2016    Patient Care Team: Leanna Battles, MD as PCP - General (Internal Medicine)  CHIEF COMPLAINTS/PURPOSE OF CONSULTATION:   Metastatic Esophageal Squamous cell carcinoma Rt thumb and hand swelling - concerning for Gout.   HISTORY OF PRESENTING ILLNESS: plz see my initial consultation regarding patients initial presentation  INTERVAL HISTORY  Samuel Henderson is here for an earlier followup since he has develop acute onset painful swelling of his left thumb (PIP and DIP) and rt knee. This is concerning for gout. He has previously had acute left knee pain and swelling and had an arthrocentesis in 04/14/2016 which was consistent with gout. He was given IM Solumedrol and started on prednisone x 5 days. He had a restaging CT chest/abd/pelvis -which had not resulted when the patient was in clinic.   MEDICAL HISTORY:   Past Medical History:  Diagnosis Date  . Arthritis   . Bone cancer ([place])   . Esophageal cancer ([place])   . History of chemotherapy   . History of radiation therapy   . Hypertension   . Knee pain     SURGICAL HISTORY: Past Surgical History:  Procedure Laterality Date  . ESOPHAGOGASTRODUODENOSCOPY (EGD) WITH ESOPHAGEAL DILATION     and biopsy  . IR GENERIC HISTORICAL  07/05/2016   IR GASTROSTOMY TUBE MOD SED 07/05/2016 [facility]  . IR GENERIC HISTORICAL  07/07/2016   IR RADIOLOGIST EVAL & MGMT 07/07/2016 Samuel Rowan, PA-C [facility]  . IR GENERIC HISTORICAL  07/20/2016   IR RADIOLOGIST EVAL & MGMT [facility]  . IRRIGATION AND DEBRIDEMENT KNEE Right 04/14/2016   Procedure: IRRIGATION AND DEBRIDEMENT RIGHT KNEE, ARTHROTOMY;  Surgeon: Paralee Cancel, MD;  Location: [facility];  Service: Orthopedics;  Laterality: Right;  . KNEE SURGERY    . LESION REMOVAL  1969   hip   SOCIAL HISTORY: Social History   Social History  . Marital status: Single    Spouse name: Samuel Henderson  . Number of children: 2  .  Years of education: Samuel Henderson   Occupational History  . Manufacturing industrial curtains    Social History Main Topics  . Smoking status: Current Every Day Smoker    Packs/day: 0.50    Years: 20.00    Types: Cigarettes  . Smokeless tobacco: Never Used  . Alcohol use No  . Drug use: Yes    Types: Marijuana     Comment: last smoked 1 week ago  . Sexual activity: Yes   Other Topics Concern  . 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 Sober for 15 yrs  Ex smoker 1/2 PPD x 27yr quit about 18 yrs ago.  FAMILY HISTORY: Family History  Problem Relation Age of Onset  . Esophageal cancer Father   . Cancer Father     nasopharyngeal   . Diabetes Sister   . Cancer Sister     multiple myeloma  . Colon cancer Neg Hx     ALLERGIES:  has No Known Allergies.  MEDICATIONS:  Current Outpatient Prescriptions  Medication Sig Dispense Refill  . capecitabine (XELODA) 500 MG tablet Place 3 tablets (1,500 mg total) into  feeding tube 2 (two) times daily after a meal. (dissolve in 255m of water as instructed) 2 weeks on 1 week off 84 tablet 2  . docusate sodium (COLACE) 100 MG capsule Take 100 mg by mouth daily as needed for mild constipation.    . fentaNYL 37.5 MCG/HR PT72 Place 37.5 mcg onto the skin every 3 (three) days. 5 patch 0  . lidocaine-prilocaine (EMLA) cream Apply 1 application topically once. 30 g 1  . magic mouthwash w/lidocaine SOLN Take 5 mLs by mouth 4 (four) times daily as needed for mouth pain. 250 mL 1  . Nutritional Supplements (FEEDING SUPPLEMENT, OSMOLITE 1.5 CAL,) LIQD Place 1,000 mLs into feeding tube 4 (four) times daily.    . ondansetron (ZOFRAN) 8 MG tablet Take 1 tablet (8 mg total) by mouth every 8 (eight) hours as  needed for nausea or vomiting. 30 tablet 3  . oxyCODONE (ROXICODONE) 5 MG/5ML solution Take 10-20 mLs (10-20 mg total) by mouth every 4 (four) hours as needed for moderate pain or severe pain. 473 mL 0  . Sennosides (SENNA) 8.8 MG/5ML SYRP Take 5 mLs (8.8 mg total) by mouth 2 (two) times daily. 300 mL 1  . Water For Irrigation, Sterile (FREE WATER) SOLN Place 200 mLs into feeding tube 5 (five) times daily. 1000 mL 0  . predniSONE (DELTASONE) 20 MG tablet Place 2 tablets (40 mg total) into feeding tube daily with breakfast. 10 tablet 0   No current facility-administered medications for this visit.     REVIEW OF SYSTEMS:    10 Point review of Systems was done is negative except as noted above.  PHYSICAL EXAMINATION: ECOG PERFORMANCE STATUS: 2  . Vitals:   10/06/16 1328  BP: (!) 135/98  Pulse: 94  Resp: 16  Temp: 98.6 F (37 C)   Filed Weights   10/06/16 1328  Weight: 107 lb 8 oz (48.8 kg)   .Body mass index is 15.87 kg/m.  .Samuel KitchenGENERAL:middle aged AAM ,alert, in no acute distress and comfortable SKIN: skin color, texture, turgor are normal, no rashes or significant lesions EYES: normal, conjunctiva are pink and non-injected, sclera clear OROPHARYNX:no exudate, no erythema and lips, buccal mucosa, and tongue normal  NECK: supple, no JVD, thyroid normal size, non-tender, without nodularity LYMPH:  no palpable lymphadenopathy in the cervical, axillary or inguinal LUNGS: clear to auscultation with normal respiratory effort HEART: regular rate & rhythm,  no murmurs and no lower extremity edema ABDOMEN: abdomen soft, non-tender, normoactive bowel sounds  Musculoskeletal: no cyanosis of digits and no clubbing  PSYCH: alert & oriented x 3 with fluent speech NEURO: some weakness in his hands Rt>left  LABORATORY DATA:  I have reviewed the data as listed. CBC Latest Ref Rng & Units 10/06/2016 09/19/2016 09/06/2016  WBC 4.0 - 10.3 10e3/uL 5.8 4.4 3.1(L)  Hemoglobin 13.0 - 17.1 g/dL 9.8(L)  10.7(L) 10.3(L)  Hematocrit 38.4 - 49.9 % 28.8(L) 32.4(L) 31.6(L)  Platelets 140 - 400 10e3/uL 304 444(H) 363    . CMP Latest Ref Rng & Units 10/06/2016 09/19/2016 09/06/2016  Glucose 70 - 140 mg/dl 98 100 94  BUN 7.0 - 26.0 mg/dL 9.8 10.2 10.8  Creatinine 0.7 - 1.3 mg/dL 0.6(L) 0.6(L) 0.6(L)  Sodium 136 - 145 mEq/L 131(L) 136 135(L)  Potassium 3.5 - 5.1 mEq/L 3.7 4.2 4.4  Chloride 101 - 111 mmol/L - - -  CO2 22 - 29 mEq/L 29 29 30(H)  Calcium 8.4 - 10.4 mg/dL 9.2 9.6 9.6  Total Protein 6.4 -  8.3 g/dL 7.4 7.4 7.2  Total Bilirubin 0.20 - 1.20 mg/dL 1.02 0.62 0.53  Alkaline Phos 40 - 150 U/L 116 153(H) 165(H)  AST 5 - 34 U/L 17 27 23   ALT 0 - 55 U/L 13 23 19           Radiology .Ct Chest W Contrast  Result Date: 10/06/2016 CLINICAL DATA:  Esophageal cancer with osseous and hepatic metastatic disease. EXAM: CT CHEST, ABDOMEN, AND PELVIS WITH CONTRAST TECHNIQUE: Multidetector CT imaging of the chest, abdomen and pelvis was performed following the standard protocol during bolus administration of intravenous contrast. CONTRAST:  168m ISOVUE-300 IOPAMIDOL (ISOVUE-300) INJECTION 61% COMPARISON:  06/05/2016 FINDINGS: CT CHEST FINDINGS Cardiovascular: Coronary, aortic arch, and branch vessel atherosclerotic vascular disease. Moderate anterior pericardial effusion, increased from prior. Mediastinum/Nodes: A soft tissue density just to the left of the take-off of the left subclavian artery measures 9 mm in short axis, formerly 4 mm in short axis, and is concerning for an enlarging prevascular lymph node. Right hilar node 0.8 cm in short axis. Rim calcified small subcarinal node. A 1.3 by 1.0 cm nodule or paraesophageal lymph node in the right as ago esophageal re- fit assess appears to correspond to a previous 0.6 by 0.7 cm nodule in this vicinity on the prior exam which previously had a more intrapulmonary appearance. This probably represents an enlarging pulmonary nodule. Lungs/Pleura: 3 mm nodule in  the left upper lobe along the major fissure, image 86/4, formerly 2 mm. Musculoskeletal: Nonunited fracture the right first rib laterally with associated rib sclerosis, I cannot exclude a pathologic fracture. Rib deformities anteriorly on the right including some bridging between several ribs. Similar findings on the left laterally. Faint sclerosis in the left anterior second and third ribs, more notably in the second rib, possibly from a metastatic lesion, less likely some type of stress fracture. Increase in evidence of osseous metastatic disease with increased sclerosis involving the T2, T3, T4, T5, and T12 vertebra. Schmorl's node or lytic component of the lesion in the T1 vertebra. CT ABDOMEN PELVIS FINDINGS Hepatobiliary: A right hepatic lobe metastatic lesion measuring 5.5 by 3.0 cm on image 68/2 previously measured 2.1 by 1.5 cm on 06/05/2016. However, there has been some cystic degeneration/necrosis of some of the lesions resulting in reduction in size of some of the lesions. A new lesion in segment 4a measures 2.1 by 3.5 cm on image 57/2 and demonstrates heterogeneous enhancement. Overall the metastatic burden to the liver is considered increased. Contracted gallbladder. Pancreas: Unremarkable Spleen: Cystic lesion in the upper spleen. Adrenals/Urinary Tract: Right mid to lower kidney hypodense lesion compatible with cyst, stable. Adrenal glands normal. Urinary bladder unremarkable. Stomach/Bowel: Peg tube appears satisfactorily positioned. Orally administered contrast extends through to the colon. Sigmoid diverticulosis. Paucity of intra- abdominal adipose tissue, along with diffuse edema of adipose tissue, makes separation of adjacent bowel loops difficult in the LEs opacified loops of bowel. Vascular/Lymphatic: Aortoiliac atherosclerotic vascular disease. A partially calcified porta hepatis mass measures 4.5 by 5.9 cm on image 61/2, previously 3.7 by 1.8 cm by my measurement. Reproductive: Unremarkable  Other: Diffuse edema in the subcutaneous, mesenteric, and remaining omental adipose tissue. Cachexia. Musculoskeletal: Along the lateral superficial margin of the right iliac crest and invading the right gluteus minimus muscle there is a new 6.6 by 2.6 cm mass. This tracks along the periosteum of the iliac crest. I favor malignancy over hematoma, there appears to be some new mixed sclerosis and lucency in the right iliac crest underlying  this lesion. Enlarging sclerotic metastatic disease in the L3 vertebral body. There is considerable sclerosis eccentric to the left at L5-S1 but much of this is probably due to degenerative disc disease and facet arthropathy based on the overall appearance. Lumbar spondylosis does cause impingement of the L3-4, L4-5, and L5-S1 levels. IMPRESSION: 1. Progressive metastatic disease to the liver, skeleton, porta hepatis, and right gluteus minimus muscle. Enlarging nodule in the right as ago esophageal recess with potential early prevascular adenopathy. 2. Cachexia with diffuse edema in the subcutaneous, mesenteric, and remaining omental adipose tissue, possibly from third spacing of fluids. 3. Lumbar spondylosis and degenerative disc disease with lower lumbar impingement noted. 4. Other imaging findings of potential clinical significance: Coronary, aortic arch, and branch vessel atherosclerotic vascular disease. Moderate anterior pericardial effusion, increased from prior. Nonunited right first rib fracture, possibly pathologic. Electronically Signed   By: Van Clines M.D.   On: 10/06/2016 14:24   Ct Abdomen Pelvis W Contrast  Result Date: 10/06/2016 CLINICAL DATA:  Esophageal cancer with osseous and hepatic metastatic disease. EXAM: CT CHEST, ABDOMEN, AND PELVIS WITH CONTRAST TECHNIQUE: Multidetector CT imaging of the chest, abdomen and pelvis was performed following the standard protocol during bolus administration of intravenous contrast. CONTRAST:  165m ISOVUE-300  IOPAMIDOL (ISOVUE-300) INJECTION 61% COMPARISON:  06/05/2016 FINDINGS: CT CHEST FINDINGS Cardiovascular: Coronary, aortic arch, and branch vessel atherosclerotic vascular disease. Moderate anterior pericardial effusion, increased from prior. Mediastinum/Nodes: A soft tissue density just to the left of the take-off of the left subclavian artery measures 9 mm in short axis, formerly 4 mm in short axis, and is concerning for an enlarging prevascular lymph node. Right hilar node 0.8 cm in short axis. Rim calcified small subcarinal node. A 1.3 by 1.0 cm nodule or paraesophageal lymph node in the right as ago esophageal re- fit assess appears to correspond to a previous 0.6 by 0.7 cm nodule in this vicinity on the prior exam which previously had a more intrapulmonary appearance. This probably represents an enlarging pulmonary nodule. Lungs/Pleura: 3 mm nodule in the left upper lobe along the major fissure, image 86/4, formerly 2 mm. Musculoskeletal: Nonunited fracture the right first rib laterally with associated rib sclerosis, I cannot exclude a pathologic fracture. Rib deformities anteriorly on the right including some bridging between several ribs. Similar findings on the left laterally. Faint sclerosis in the left anterior second and third ribs, more notably in the second rib, possibly from a metastatic lesion, less likely some type of stress fracture. Increase in evidence of osseous metastatic disease with increased sclerosis involving the T2, T3, T4, T5, and T12 vertebra. Schmorl's node or lytic component of the lesion in the T1 vertebra. CT ABDOMEN PELVIS FINDINGS Hepatobiliary: A right hepatic lobe metastatic lesion measuring 5.5 by 3.0 cm on image 68/2 previously measured 2.1 by 1.5 cm on 06/05/2016. However, there has been some cystic degeneration/necrosis of some of the lesions resulting in reduction in size of some of the lesions. A new lesion in segment 4a measures 2.1 by 3.5 cm on image 57/2 and demonstrates  heterogeneous enhancement. Overall the metastatic burden to the liver is considered increased. Contracted gallbladder. Pancreas: Unremarkable Spleen: Cystic lesion in the upper spleen. Adrenals/Urinary Tract: Right mid to lower kidney hypodense lesion compatible with cyst, stable. Adrenal glands normal. Urinary bladder unremarkable. Stomach/Bowel: Peg tube appears satisfactorily positioned. Orally administered contrast extends through to the colon. Sigmoid diverticulosis. Paucity of intra- abdominal adipose tissue, along with diffuse edema of adipose tissue, makes separation of adjacent  bowel loops difficult in the LEs opacified loops of bowel. Vascular/Lymphatic: Aortoiliac atherosclerotic vascular disease. A partially calcified porta hepatis mass measures 4.5 by 5.9 cm on image 61/2, previously 3.7 by 1.8 cm by my measurement. Reproductive: Unremarkable Other: Diffuse edema in the subcutaneous, mesenteric, and remaining omental adipose tissue. Cachexia. Musculoskeletal: Along the lateral superficial margin of the right iliac crest and invading the right gluteus minimus muscle there is a new 6.6 by 2.6 cm mass. This tracks along the periosteum of the iliac crest. I favor malignancy over hematoma, there appears to be some new mixed sclerosis and lucency in the right iliac crest underlying this lesion. Enlarging sclerotic metastatic disease in the L3 vertebral body. There is considerable sclerosis eccentric to the left at L5-S1 but much of this is probably due to degenerative disc disease and facet arthropathy based on the overall appearance. Lumbar spondylosis does cause impingement of the L3-4, L4-5, and L5-S1 levels. IMPRESSION: 1. Progressive metastatic disease to the liver, skeleton, porta hepatis, and right gluteus minimus muscle. Enlarging nodule in the right as ago esophageal recess with potential early prevascular adenopathy. 2. Cachexia with diffuse edema in the subcutaneous, mesenteric, and remaining  omental adipose tissue, possibly from third spacing of fluids. 3. Lumbar spondylosis and degenerative disc disease with lower lumbar impingement noted. 4. Other imaging findings of potential clinical significance: Coronary, aortic arch, and branch vessel atherosclerotic vascular disease. Moderate anterior pericardial effusion, increased from prior. Nonunited right first rib fracture, possibly pathologic. Electronically Signed   By: Van Clines M.D.   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 S/p palliative radiation therapy to the esophagus and regional lymph nodes. He has tolerated treatment generally well except for some mild grade 2 radiation esophagitis which has now resolved. Repeat CT chest abdomen pelvis done 11/17/2015 show progression of his liver metastases and gastrohepatic lymph node as expected.  He has completed his 6 cycles of carboplatin and Taxol  With grade 1-2 fatigue and grade 1 neuropathy CT c/a/p with stable improved disease with some  T1 metastasis, as before. Additional lesions in the L3 vertebral body and right sacrum, corresponding to abnormalities on 04/06/2016 and worrisome for metastatic disease.  CT CAP 06/05/2016 -- significant progression of liver mets and concern for new abnormal thickening of the esophagus -likely reflecting local tumor recurrence.  EGD done 06/26/2016 confirms local recurrence of SCC with PDL1 expression 0%  Patient had completed 3 cycle of XELIRI  Rpt CT chest/abd/pelvis 10/06/2016 - Progressive metastatic disease to the liver, skeleton, porta hepatis, and right gluteus minimus muscle.  2) Dysphagia due to esophageal SCC - worsening again with imaging for local tumor recurrent in the esophagus. 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 This has improved and remains nonpainful. 8) neoplasm related pain especially from bone metastases in his lower back - controlled better with adjustment of his pain medications 9) neoplasm related pain in the upper thoracic spine T1 (bone mets). Radiating to bilateral upper extremities suggesting C7 nerve root involvement. Resolved/controlled-completed palliative RT for this. 10)Bone mets  11)  Rt thumb joint pain and swelling and hand swelling and Rt knee pain and swelling likely related to Gout. Plan -given IM solumedrol and PO prednisone 53m po daily x 5 days for gout -also might use NSAIDS OTC (Naproxen 5063mpo BID x 3-4 days) -counseled to call usKoreaf worsening pain and swelling. -CT CAP showed clear progression of disease -we shall hold XELIRI -we will bring patient back on 10/10/2016 as per his previously scheduled appointment to discuss goals of care and choice of further treatment - Best support cares through hospice vs additional palliative chemotherapy (Docetaxel) -cannot use Pembrolizumab due to neg PDL1 status. -Optimize tube feeding as much as possible. . -Encouraged to maintain mobility . -continue fentanyl patch at 37.5 g/h and oxycodone 10-2046m4h prn for breakthrough pain -Continue XgeMarchelle Folksr bone mets  RTC with Dr KalIrene Limbo 10/10/2016 to define further treatment options  All of his questions and concerns were answered in detail.  I spent 20 minutes counseling the patient face to face. The total time spent in the appointment was 25 minutes and more than 50% was on counseling and direct patient cares.    GauSullivan Lone MS GrantvilleHIVMS SCHWilliam J Mccord Adolescent Treatment FacilityHGwinnett Advanced Surgery Center LLCmatology/Oncology Physician ConPeak One Surgery CenterOffice):        [phone] cell):  [phone]):           [phone]

## 2016-10-09 MED FILL — fentaNYL 37.5 MCG/HR PT72: 37.5 | 15 days supply | Qty: 5 | Fill #0

## 2016-10-10 ENCOUNTER — Ambulatory Visit: Payer: Medicaid Other

## 2016-10-10 ENCOUNTER — Ambulatory Visit: Payer: Medicaid Other | Admitting: Hematology

## 2016-10-10 ENCOUNTER — Ambulatory Visit (HOSPITAL_BASED_OUTPATIENT_CLINIC_OR_DEPARTMENT_OTHER): Payer: Medicaid Other | Admitting: Hematology

## 2016-10-10 ENCOUNTER — Other Ambulatory Visit: Payer: Medicaid Other

## 2016-10-10 ENCOUNTER — Encounter: Payer: Self-pay | Admitting: Hematology

## 2016-10-10 VITALS — BP 124/79 | HR 63 | Temp 98.6°F | Resp 16 | Ht 69.0 in | Wt 104.9 lb

## 2016-10-10 DIAGNOSIS — C155 Malignant neoplasm of lower third of esophagus: Secondary | ICD-10-CM | POA: Diagnosis not present

## 2016-10-10 DIAGNOSIS — M109 Gout, unspecified: Secondary | ICD-10-CM

## 2016-10-10 DIAGNOSIS — C7951 Secondary malignant neoplasm of bone: Secondary | ICD-10-CM

## 2016-10-10 DIAGNOSIS — D649 Anemia, unspecified: Secondary | ICD-10-CM | POA: Diagnosis not present

## 2016-10-11 NOTE — Progress Notes (Signed)
Mr. 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       Bowel/Bladder retention or incontinence : Having diarrhea and constipation at times,having normal bowel every two days. Numbness or weakness in extremities : Numbness in hands,finger tips and feet. Current Decadron regimen, if applicable:Prednisone 20 mg 2 tablets daily completed yesterday Appetite:Has a peg tube taking in 1,000 ml of Osmolite daily 4 cans a day and flushing peg tube with 200 ml four times a day and drinking fluid around two to three glasses by mouth.  Not taking in any food by mouth. Fatigue:Having fatigue all during the day off and on. Skin status:Normal skin to chest and thoracic area. Pain:4/10 esophagus taking Oxycodone Swallowing issues:Unable to swallow any thing except water.            Ambulatory status? Walker? Wheelchair?: Ambulatory with a cane.            Wt Readings from Last 3 Encounters:  10/17/16 106 lb 6.4 oz (48.3 kg)  10/10/16 104 lb 14.4 oz (47.6 kg)  10/06/16 107 lb 8 oz (48.8 kg)  BP 126/85   Pulse 86   Temp 98.6 F (37 C) (Oral)   Resp 16   Ht 5\' 9"  (1.753 m)   Wt 106 lb 6.4 oz (48.3 kg)   SpO2 100%   BMI 15.71 kg/m

## 2016-10-13 ENCOUNTER — Other Ambulatory Visit: Payer: Self-pay | Admitting: *Deleted

## 2016-10-13 ENCOUNTER — Telehealth: Payer: Self-pay | Admitting: Medical Oncology

## 2016-10-13 DIAGNOSIS — C159 Malignant neoplasm of esophagus, unspecified: Secondary | ICD-10-CM

## 2016-10-13 MED ORDER — OXYCODONE HCL 5 MG/5ML PO SOLN: 10.0000 mg | ORAL | 0 refills | Status: DC | PRN

## 2016-10-13 NOTE — Telephone Encounter (Signed)
Requests refill . States he take 10 ml every 4 hours. Note to Dr. Irene Limbo RN.

## 2016-10-14 ENCOUNTER — Telehealth: Payer: Self-pay | Admitting: Hematology

## 2016-10-14 NOTE — Telephone Encounter (Signed)
Appointments scheduled per 2/6 LOS. Patient notified. °

## 2016-10-14 NOTE — Telephone Encounter (Signed)
No los for 10/06/16 visit

## 2016-10-17 ENCOUNTER — Ambulatory Visit
Admission: RE | Admit: 2016-10-17 | Discharge: 2016-10-17 | Disposition: A | Discharge status: Home / Self Care | Payer: Medicaid Other | Source: Ambulatory Visit | Attending: Radiation Oncology | Admitting: Radiation Oncology

## 2016-10-17 ENCOUNTER — Other Ambulatory Visit: Payer: Self-pay | Admitting: Radiation Oncology

## 2016-10-17 ENCOUNTER — Encounter: Payer: Self-pay | Admitting: Radiation Oncology

## 2016-10-17 VITALS — BP 126/85 | HR 86 | Temp 98.6°F | Resp 16 | Ht 69.0 in | Wt 106.4 lb

## 2016-10-17 DIAGNOSIS — E46 Unspecified protein-calorie malnutrition: Secondary | ICD-10-CM | POA: Insufficient documentation

## 2016-10-17 DIAGNOSIS — R64 Cachexia: Secondary | ICD-10-CM | POA: Diagnosis not present

## 2016-10-17 DIAGNOSIS — C7951 Secondary malignant neoplasm of bone: Secondary | ICD-10-CM | POA: Diagnosis not present

## 2016-10-17 DIAGNOSIS — C7949 Secondary malignant neoplasm of other parts of nervous system: Secondary | ICD-10-CM

## 2016-10-17 DIAGNOSIS — F17210 Nicotine dependence, cigarettes, uncomplicated: Secondary | ICD-10-CM | POA: Insufficient documentation

## 2016-10-17 DIAGNOSIS — Z79899 Other long term (current) drug therapy: Secondary | ICD-10-CM | POA: Insufficient documentation

## 2016-10-17 DIAGNOSIS — R627 Adult failure to thrive: Secondary | ICD-10-CM | POA: Diagnosis not present

## 2016-10-17 DIAGNOSIS — C787 Secondary malignant neoplasm of liver and intrahepatic bile duct: Secondary | ICD-10-CM | POA: Diagnosis not present

## 2016-10-17 DIAGNOSIS — R1310 Dysphagia, unspecified: Secondary | ICD-10-CM | POA: Diagnosis not present

## 2016-10-17 DIAGNOSIS — C159 Malignant neoplasm of esophagus, unspecified: Secondary | ICD-10-CM | POA: Diagnosis not present

## 2016-10-17 MED FILL — oxyCODONE HCL 5 MG/5ML SOLN: 5 | 4 days supply | Qty: 473 | Fill #0

## 2016-10-17 NOTE — Progress Notes (Signed)
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  Interval Since Last Radiation:  7 weeks   08/17/16-08/30/16: C7-T2 spine was treated to 30 Gy in 10 fractions  05/29/16-06/09/16: L2-SI Joints was treated to 30 Gy in 10 fractions  10/12/2015-10/22/2015: The distal esophagus was treated to 30 Gy in 10 fractions of 3 Gy  Narrative:  In summary this is a pleasant 56 y.o. gentleman with metastatic esophageal cancer with disease progressing in the esophagus, liver, and spine. Unfortunately he has exhausted additional chemotherapy options and has been offered hospice, versus consideration of one additional chemotherapy regimen of taxotere. He has considered his discussion with Dr. Irene Limbo, and at this point is interested in focusing on quality of life away from aggressive treatment. He hasn't met with hospice yet.            On review of systems, the patient reports he is struggling with issues of oral intake, with progressive dysphagia. He has decided to forgo any esophageal stenting. He continues to lose weight, and is currently taking in 4 cans of osmolite/day. He describes trouble swallowing liquids at this time. He denies any shortness of breath or chest pain. He is very tired and fatigued, and is concerned about maintaining what quality of living he currently has. No other complaints are noted. Past Medical History:  Past Medical History:  Diagnosis Date  . Arthritis   . Bone cancer ([place])   . Esophageal cancer ([place])   . History of chemotherapy   . History of radiation therapy   . Hypertension   . Knee pain     Past Surgical History: Past Surgical History:  Procedure Laterality Date  .  ESOPHAGOGASTRODUODENOSCOPY (EGD) WITH ESOPHAGEAL DILATION     and biopsy  . IR GENERIC HISTORICAL  07/05/2016   IR GASTROSTOMY TUBE MOD SED 07/05/2016 [facility]  . IR GENERIC HISTORICAL  07/07/2016   IR RADIOLOGIST EVAL & MGMT 07/07/2016 [name], PA-C [facility]  . IR GENERIC HISTORICAL  07/20/2016   IR RADIOLOGIST EVAL & MGMT [facility]  . IRRIGATION AND DEBRIDEMENT KNEE Right 04/14/2016   Procedure: IRRIGATION AND DEBRIDEMENT RIGHT KNEE, ARTHROTOMY;  Surgeon: Paralee Cancel, MD;  Location: [facility];  Service: Orthopedics;  Laterality: Right;  . KNEE SURGERY    . LESION REMOVAL  1969   hip    Social History:  Social History   Social History  . Marital status: Single    Spouse name: N/A  . Number of children: 2  . Years of education: N/A   Occupational History  . Manufacturing industrial curtains    Social History Main Topics  . Smoking status: Current Every Day Smoker    Packs/day: 0.50    Years: 20.00    Types: Cigarettes  . Smokeless tobacco: Never Used  . Alcohol use No  . Drug use: Yes    Types: Marijuana     Comment: last smoked 1 week ago  . Sexual activity: Yes   Other Topics Concern  . 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 Esophageal cancer Father   . Cancer Father     nasopharyngeal   . Diabetes Sister   . Cancer Sister     multiple myeloma  . Colon cancer Neg Hx      ALLERGIES:  has No Known Allergies.  Meds: Current Outpatient Prescriptions  Medication Sig Dispense Refill  . fentaNYL 37.5 MCG/HR PT72 Place 37.5 mcg onto the skin every 3 (three) days. 5 patch 0  . Nutritional Supplements (FEEDING SUPPLEMENT, OSMOLITE 1.5 CAL,) LIQD Place 1,000 mLs into feeding tube 4  (four) times daily.    [name] Kitchen oxyCODONE (ROXICODONE) 5 MG/5ML solution Take 10-20 mLs (10-20 mg total) by mouth every 4 (four) hours as needed for moderate pain or severe pain. 473 mL 0  . predniSONE (DELTASONE) 20 MG tablet Place 2 tablets (40 mg total) into feeding tube daily with breakfast. 10 tablet 0  . Water For Irrigation, Sterile (FREE WATER) SOLN Place 200 mLs into feeding tube 5 (five) times daily. 1000 mL 0  . capecitabine (XELODA) 500 MG tablet Place 3 tablets (1,500 mg total) into feeding tube 2 (two) times daily after a meal. (dissolve in 234m of water as instructed) 2 weeks on 1 week off (Patient not taking: Reported on 10/17/2016) 84 tablet 2  . docusate sodium (COLACE) 100 MG capsule Take 100 mg by mouth daily as needed for mild constipation.    . lidocaine-prilocaine (EMLA) cream Apply 1 application topically once. (Patient not taking: Reported on 10/17/2016) 30 g 1  . magic mouthwash w/lidocaine SOLN Take 5 mLs by mouth 4 (four) times daily as needed for mouth pain. (Patient not taking: Reported on 10/17/2016) 250 mL 1  . ondansetron (ZOFRAN) 8 MG tablet Take 1 tablet (8 mg total) by mouth every 8 (eight) hours as needed for nausea or vomiting. (Patient not taking: Reported on 10/17/2016) 30 tablet 3  . Sennosides (SENNA) 8.8 MG/5ML SYRP Take 5 mLs (8.8 mg total) by mouth 2 (two) times daily. (Patient not taking: Reported on 10/17/2016) 300 mL 1   No current facility-administered medications for this encounter.     Physical Findings:  height is 5' 9"  (1.753 m) and weight is 106 lb 6.4 oz (48.3 kg). His oral temperature is 98.6 F (37 C). His blood pressure is 126/85 and his pulse is 86. His respiration is 16 and oxygen saturation is 100%.  In general this is a cachectic and chronically ill appearing African American male in no acute distress. He's alert and oriented x4 and appropriate throughout the examination. Cardiopulmonary assessment is negative for acute distress and he exhibits  normal effort.   Lab Findings: Lab Results  Component Value Date   WBC 5.8 10/06/2016   HGB 9.8 (L) 10/06/2016   HCT 28.8 (L) 10/06/2016   MCV 89.2 10/06/2016   PLT 304 10/06/2016     Radiographic Findings: Ct Chest W Contrast  Result Date: 10/06/2016 CLINICAL DATA:  Esophageal cancer with osseous and hepatic metastatic disease. EXAM: CT CHEST, ABDOMEN, AND PELVIS WITH CONTRAST TECHNIQUE: Multidetector CT imaging of the chest, abdomen and pelvis was performed following the standard protocol during bolus administration of intravenous contrast. CONTRAST:  1035mISOVUE-300 IOPAMIDOL (ISOVUE-300) INJECTION 61% COMPARISON:  06/05/2016 FINDINGS: CT CHEST FINDINGS Cardiovascular: Coronary, aortic arch, and branch vessel atherosclerotic vascular disease. Moderate anterior pericardial effusion, increased from prior.  Mediastinum/Nodes: A soft tissue density just to the left of the take-off of the left subclavian artery measures 9 mm in short axis, formerly 4 mm in short axis, and is concerning for an enlarging prevascular lymph node. Right hilar node 0.8 cm in short axis. Rim calcified small subcarinal node. A 1.3 by 1.0 cm nodule or paraesophageal lymph node in the right as ago esophageal re- fit assess appears to correspond to a previous 0.6 by 0.7 cm nodule in this vicinity on the prior exam which previously had a more intrapulmonary appearance. This probably represents an enlarging pulmonary nodule. Lungs/Pleura: 3 mm nodule in the left upper lobe along the major fissure, image 86/4, formerly 2 mm. Musculoskeletal: Nonunited fracture the right first rib laterally with associated rib sclerosis, I cannot exclude a pathologic fracture. Rib deformities anteriorly on the right including some bridging between several ribs. Similar findings on the left laterally. Faint sclerosis in the left anterior second and third ribs, more notably in the second rib, possibly from a metastatic lesion, less likely some type of  stress fracture. Increase in evidence of osseous metastatic disease with increased sclerosis involving the T2, T3, T4, T5, and T12 vertebra. Schmorl's node or lytic component of the lesion in the T1 vertebra. CT ABDOMEN PELVIS FINDINGS Hepatobiliary: A right hepatic lobe metastatic lesion measuring 5.5 by 3.0 cm on image 68/2 previously measured 2.1 by 1.5 cm on 06/05/2016. However, there has been some cystic degeneration/necrosis of some of the lesions resulting in reduction in size of some of the lesions. A new lesion in segment 4a measures 2.1 by 3.5 cm on image 57/2 and demonstrates heterogeneous enhancement. Overall the metastatic burden to the liver is considered increased. Contracted gallbladder. Pancreas: Unremarkable Spleen: Cystic lesion in the upper spleen. Adrenals/Urinary Tract: Right mid to lower kidney hypodense lesion compatible with cyst, stable. Adrenal glands normal. Urinary bladder unremarkable. Stomach/Bowel: Peg tube appears satisfactorily positioned. Orally administered contrast extends through to the colon. Sigmoid diverticulosis. Paucity of intra- abdominal adipose tissue, along with diffuse edema of adipose tissue, makes separation of adjacent bowel loops difficult in the LEs opacified loops of bowel. Vascular/Lymphatic: Aortoiliac atherosclerotic vascular disease. A partially calcified porta hepatis mass measures 4.5 by 5.9 cm on image 61/2, previously 3.7 by 1.8 cm by my measurement. Reproductive: Unremarkable Other: Diffuse edema in the subcutaneous, mesenteric, and remaining omental adipose tissue. Cachexia. Musculoskeletal: Along the lateral superficial margin of the right iliac crest and invading the right gluteus minimus muscle there is a new 6.6 by 2.6 cm mass. This tracks along the periosteum of the iliac crest. I favor malignancy over hematoma, there appears to be some new mixed sclerosis and lucency in the right iliac crest underlying this lesion. Enlarging sclerotic metastatic  disease in the L3 vertebral body. There is considerable sclerosis eccentric to the left at L5-S1 but much of this is probably due to degenerative disc disease and facet arthropathy based on the overall appearance. Lumbar spondylosis does cause impingement of the L3-4, L4-5, and L5-S1 levels. IMPRESSION: 1. Progressive metastatic disease to the liver, skeleton, porta hepatis, and right gluteus minimus muscle. Enlarging nodule in the right as ago esophageal recess with potential early prevascular adenopathy. 2. Cachexia with diffuse edema in the subcutaneous, mesenteric, and remaining omental adipose tissue, possibly from third spacing of fluids. 3. Lumbar spondylosis and degenerative disc disease with lower lumbar impingement noted. 4. Other imaging findings of potential clinical significance: Coronary, aortic arch, and branch vessel atherosclerotic vascular disease. Moderate anterior pericardial  effusion, increased from prior. Nonunited right first rib fracture, possibly pathologic. Electronically Signed   By: Van Clines M.D.   On: 10/06/2016 14:24   Ct Abdomen Pelvis W Contrast  Result Date: 10/06/2016 CLINICAL DATA:  Esophageal cancer with osseous and hepatic metastatic disease. EXAM: CT CHEST, ABDOMEN, AND PELVIS WITH CONTRAST TECHNIQUE: Multidetector CT imaging of the chest, abdomen and pelvis was performed following the standard protocol during bolus administration of intravenous contrast. CONTRAST:  135m ISOVUE-300 IOPAMIDOL (ISOVUE-300) INJECTION 61% COMPARISON:  06/05/2016 FINDINGS: CT CHEST FINDINGS Cardiovascular: Coronary, aortic arch, and branch vessel atherosclerotic vascular disease. Moderate anterior pericardial effusion, increased from prior. Mediastinum/Nodes: A soft tissue density just to the left of the take-off of the left subclavian artery measures 9 mm in short axis, formerly 4 mm in short axis, and is concerning for an enlarging prevascular lymph node. Right hilar node 0.8 cm in  short axis. Rim calcified small subcarinal node. A 1.3 by 1.0 cm nodule or paraesophageal lymph node in the right as ago esophageal re- fit assess appears to correspond to a previous 0.6 by 0.7 cm nodule in this vicinity on the prior exam which previously had a more intrapulmonary appearance. This probably represents an enlarging pulmonary nodule. Lungs/Pleura: 3 mm nodule in the left upper lobe along the major fissure, image 86/4, formerly 2 mm. Musculoskeletal: Nonunited fracture the right first rib laterally with associated rib sclerosis, I cannot exclude a pathologic fracture. Rib deformities anteriorly on the right including some bridging between several ribs. Similar findings on the left laterally. Faint sclerosis in the left anterior second and third ribs, more notably in the second rib, possibly from a metastatic lesion, less likely some type of stress fracture. Increase in evidence of osseous metastatic disease with increased sclerosis involving the T2, T3, T4, T5, and T12 vertebra. Schmorl's node or lytic component of the lesion in the T1 vertebra. CT ABDOMEN PELVIS FINDINGS Hepatobiliary: A right hepatic lobe metastatic lesion measuring 5.5 by 3.0 cm on image 68/2 previously measured 2.1 by 1.5 cm on 06/05/2016. However, there has been some cystic degeneration/necrosis of some of the lesions resulting in reduction in size of some of the lesions. A new lesion in segment 4a measures 2.1 by 3.5 cm on image 57/2 and demonstrates heterogeneous enhancement. Overall the metastatic burden to the liver is considered increased. Contracted gallbladder. Pancreas: Unremarkable Spleen: Cystic lesion in the upper spleen. Adrenals/Urinary Tract: Right mid to lower kidney hypodense lesion compatible with cyst, stable. Adrenal glands normal. Urinary bladder unremarkable. Stomach/Bowel: Peg tube appears satisfactorily positioned. Orally administered contrast extends through to the colon. Sigmoid diverticulosis. Paucity of  intra- abdominal adipose tissue, along with diffuse edema of adipose tissue, makes separation of adjacent bowel loops difficult in the LEs opacified loops of bowel. Vascular/Lymphatic: Aortoiliac atherosclerotic vascular disease. A partially calcified porta hepatis mass measures 4.5 by 5.9 cm on image 61/2, previously 3.7 by 1.8 cm by my measurement. Reproductive: Unremarkable Other: Diffuse edema in the subcutaneous, mesenteric, and remaining omental adipose tissue. Cachexia. Musculoskeletal: Along the lateral superficial margin of the right iliac crest and invading the right gluteus minimus muscle there is a new 6.6 by 2.6 cm mass. This tracks along the periosteum of the iliac crest. I favor malignancy over hematoma, there appears to be some new mixed sclerosis and lucency in the right iliac crest underlying this lesion. Enlarging sclerotic metastatic disease in the L3 vertebral body. There is considerable sclerosis eccentric to the left at L5-S1 but much of this  is probably due to degenerative disc disease and facet arthropathy based on the overall appearance. Lumbar spondylosis does cause impingement of the L3-4, L4-5, and L5-S1 levels. IMPRESSION: 1. Progressive metastatic disease to the liver, skeleton, porta hepatis, and right gluteus minimus muscle. Enlarging nodule in the right as ago esophageal recess with potential early prevascular adenopathy. 2. Cachexia with diffuse edema in the subcutaneous, mesenteric, and remaining omental adipose tissue, possibly from third spacing of fluids. 3. Lumbar spondylosis and degenerative disc disease with lower lumbar impingement noted. 4. Other imaging findings of potential clinical significance: Coronary, aortic arch, and branch vessel atherosclerotic vascular disease. Moderate anterior pericardial effusion, increased from prior. Nonunited right first rib fracture, possibly pathologic. Electronically Signed   By: Van Clines M.D.   On: 10/06/2016 14:24     Impression/Plan: 1. 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 Unfortunately Samuel Henderson disease has progressed in the bones and viscera. He has decided to forgo additional treatment with chemotherapy, and focus on his quality of life. We discussed the options for hospice and after reviewing this, we will proceed with urgent referral.  2. Protein calorie malnutrition with clinical concern with failure to thrive. The patient will continue 4 cans of osmolite daily. We will defer further recommendations to hospice regarding whether or not he continues with this.  3. Code status. The patient has not considered his options of resuscitation. We reviewed DNR status versus measures consistent with advanced cardiac life support. He is leaning towards DNR status, and a copy of this form as well as a MOST form was provided for him to review this with his family. He understands that this discussion will also be reviewed with hospice care.     Carola Rhine, PAC

## 2016-10-22 NOTE — Progress Notes (Signed)
Samuel Henderson    HEMATOLOGY/ONCOLOGY CLINIC NOTE  Date of Service: .10/10/2016    Patient Care Team: Leanna Battles, MD as PCP - General (Internal Medicine)  CHIEF COMPLAINTS/PURPOSE OF CONSULTATION:   Metastatic Esophageal Squamous cell carcinoma Rt thumb and hand swelling - concerning for Gout.   HISTORY OF PRESENTING ILLNESS: plz see my initial consultation regarding patients initial presentation  INTERVAL HISTORY  Mr Kleinfelter is here for followup of his re-staging CT chest/abd/pelvis to determine further treatment strategy. His rt hand and thumb swelling and redness has near resolved with steroids confirming the diagnosis of gout.  We discussed that unfortunately his scan show clear signs of disease progression. His symptoms from metastatic esophageal cancer are fairly well controlled. We discussed options of additional palliative chemotherapy vs Best supportive cares through hospice and he appears to be leaning towards hospice but would like to think about it further and let us know. No other acute new focal symtpoms.  MEDICAL HISTORY:   Past Medical History:  Diagnosis Date  . Arthritis   . Bone cancer ([place])   . Esophageal cancer ([place])   . History of chemotherapy   . History of radiation therapy   . Hypertension   . Knee pain     SURGICAL HISTORY: Past Surgical History:  Procedure Laterality Date  . ESOPHAGOGASTRODUODENOSCOPY (EGD) WITH ESOPHAGEAL DILATION     and biopsy  . IR GENERIC HISTORICAL  07/05/2016   IR GASTROSTOMY TUBE MOD SED 07/05/2016 [facility]  . IR GENERIC HISTORICAL  07/07/2016   IR RADIOLOGIST EVAL & MGMT 07/07/2016 [name], PA-C [facility]  . IR GENERIC HISTORICAL  07/20/2016   IR RADIOLOGIST EVAL & MGMT [facility]  . IRRIGATION AND DEBRIDEMENT KNEE Right 04/14/2016   Procedure: IRRIGATION AND DEBRIDEMENT RIGHT KNEE, ARTHROTOMY;  Surgeon: Paralee Cancel, MD;  Location: [facility];  Service: Orthopedics;  Laterality: Right;  . KNEE SURGERY    .  LESION REMOVAL  1969   hip   SOCIAL HISTORY: Social History   Social History  . Marital status: Single    Spouse name: N/A  . Number of children: 2  . Years of education: N/A   Occupational History  . Manufacturing industrial curtains    Social History Main Topics  . Smoking status: Current Every Day Smoker    Packs/day: 0.50    Years: 20.00    Types: Cigarettes  . Smokeless tobacco: Never Used  . Alcohol use No  . Drug use: Yes    Types: Marijuana     Comment: last smoked 1 week ago  . Sexual activity: Yes   Other Topics Concern  . 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  FAMILY HISTORY: Family History  Problem Relation Age of Onset  . Esophageal cancer Father   . Cancer Father     nasopharyngeal   . Diabetes Sister   . Cancer Sister     multiple myeloma  . Colon cancer Neg Hx     ALLERGIES:  has No Known Allergies.  MEDICATIONS:  Current Outpatient Prescriptions  Medication Sig Dispense Refill  . capecitabine (XELODA) 500 MG tablet Place 3 tablets (1,500 mg total) into feeding tube 2 (two) times daily after a meal. (dissolve in 222m of water as instructed) 2 weeks on 1 week off (Patient not taking: Reported on 10/17/2016) 84 tablet 2  . docusate sodium (COLACE) 100 MG capsule Take 100 mg by mouth daily as needed for mild constipation.    . fentaNYL 37.5 MCG/HR PT72 Place 37.5 mcg onto the skin every 3 (three) days. 5 patch 0  . lidocaine-prilocaine (EMLA) cream Apply 1 application topically once. (Patient not taking: Reported on 10/17/2016) 30 g 1  . magic mouthwash w/lidocaine SOLN Take 5 mLs by mouth 4 (four) times daily  as needed for mouth pain. (Patient not taking: Reported on 10/17/2016) 250 mL 1  . Nutritional Supplements (FEEDING SUPPLEMENT, OSMOLITE 1.5 CAL,) LIQD Place 1,000 mLs into feeding tube 4 (four) times daily.    . ondansetron (ZOFRAN) 8 MG tablet Take 1 tablet (8 mg total) by mouth every 8 (eight) hours as needed for nausea or vomiting. (Patient not taking: Reported on 10/17/2016) 30 tablet 3  . oxyCODONE (ROXICODONE) 5 MG/5ML solution Take 10-20 mLs (10-20 mg total) by mouth every 4 (four) hours as needed for moderate pain or severe pain. 473 mL 0  . predniSONE (DELTASONE) 20 MG tablet Place 2 tablets (40 mg total) into feeding tube daily with breakfast. 10 tablet 0  . Sennosides (SENNA) 8.8 MG/5ML SYRP Take 5 mLs (8.8 mg total) by mouth 2 (two) times daily. (Patient not taking: Reported on 10/17/2016) 300 mL 1  . Water For Irrigation, Sterile (FREE WATER) SOLN Place 200 mLs into feeding tube 5 (five) times daily. 1000 mL 0   No current facility-administered medications for this visit.     REVIEW OF SYSTEMS:    10 Point review of Systems was done is negative except as noted above.  PHYSICAL EXAMINATION: ECOG PERFORMANCE STATUS: 2  . Vitals:   10/10/16 0945  BP: 124/79  Pulse: 63  Resp: 16  Temp: 98.6 F (37 C)   Filed Weights   10/10/16 0945  Weight: 104 lb 14.4 oz (47.6 kg)   .Body mass index is 15.49 kg/m.  .Samuel KitchenENERAL:middle aged AAM ,alert, in no acute distress and comfortable SKIN: skin color, texture, turgor are normal, no rashes or significant lesions EYES: normal, conjunctiva are pink and non-injected, sclera clear OROPHARYNX:no exudate, no erythema and lips, buccal mucosa, and tongue normal  NECK: supple, no JVD, thyroid normal size, non-tender, without nodularity LYMPH:  no palpable lymphadenopathy in the cervical, axillary or inguinal LUNGS: clear to auscultation with normal respiratory effort HEART: regular rate & rhythm,  no murmurs and no lower extremity  edema ABDOMEN: abdomen soft, non-tender, normoactive bowel sounds  Musculoskeletal: rt hand and thumb pain and swelling have nearly resolved. PSYCH: alert & oriented x 3 with fluent speech NEURO: some weakness in his hands Rt>left  LABORATORY DATA:  I have reviewed the data as listed. CBC Latest Ref Rng & Units 10/06/2016 09/19/2016 09/06/2016  WBC 4.0 - 10.3 10e3/uL 5.8 4.4 3.1(L)  Hemoglobin 13.0 - 17.1 g/dL 9.8(L) 10.7(L) 10.3(L)  Hematocrit 38.4 - 49.9 % 28.8(L) 32.4(L) 31.6(L)  Platelets 140 - 400 10e3/uL 304 444(H) 363    . CMP Latest Ref Rng & Units 10/06/2016 09/19/2016 09/06/2016  Glucose 70 - 140 mg/dl 98 100 94  BUN 7.0 - 26.0 mg/dL 9.8 10.2 10.8  Creatinine 0.7 - 1.3 mg/dL 0.6(L) 0.6(L) 0.6(L)  Sodium 136 - 145 mEq/L 131(L) 136 135(L)  Potassium 3.5 - 5.1 mEq/L 3.7 4.2 4.4  Chloride 101 - 111 mmol/L - - -  CO2 22 - 29 mEq/L 29 29 30(H)  Calcium 8.4 - 10.4 mg/dL 9.2 9.6 9.6  Total Protein 6.4 - 8.3 g/dL 7.4 7.4 7.2  Total Bilirubin 0.20 - 1.20 mg/dL 1.02 0.62 0.53  Alkaline Phos 40 - 150 U/L 116 153(H) 165(H)  AST 5 - 34 U/L _0 ALT 0 - 55 U/L _1 Radiology .Ct Chest W Contrast  Result Date: 10/06/2016 CLINICAL DATA:  Esophageal cancer with osseous and hepatic metastatic disease. EXAM: CT CHEST, ABDOMEN, AND PELVIS WITH CONTRAST TECHNIQUE: Multidetector CT imaging of the chest, abdomen and pelvis was performed following the standard protocol during bolus administration of intravenous contrast. CONTRAST:  138m ISOVUE-300 IOPAMIDOL (ISOVUE-300) INJECTION 61% COMPARISON:  06/05/2016 FINDINGS: CT CHEST FINDINGS Cardiovascular: Coronary, aortic arch, and branch vessel atherosclerotic vascular disease. Moderate anterior pericardial effusion, increased from prior. Mediastinum/Nodes: A soft tissue density just to the left of the take-off of the left subclavian artery measures 9 mm in short axis, formerly 4 mm in short axis, and is concerning for an enlarging  prevascular lymph node. Right hilar node 0.8 cm in short axis. Rim calcified small subcarinal node. A 1.3 by 1.0 cm nodule or paraesophageal lymph node in the right as ago esophageal re- fit assess appears to correspond to a previous 0.6 by 0.7 cm nodule in this vicinity on the prior exam which previously had a more intrapulmonary appearance. This probably represents an enlarging pulmonary nodule. Lungs/Pleura: 3 mm nodule in the left upper lobe along the major fissure, image 86/4, formerly 2 mm. Musculoskeletal: Nonunited fracture the right first rib laterally with associated rib sclerosis, I cannot exclude a pathologic fracture. Rib deformities anteriorly on the right including some bridging between several ribs. Similar findings on the left laterally. Faint sclerosis in the left anterior second and third ribs, more notably in the second rib, possibly from a metastatic lesion, less likely some type of stress fracture. Increase in evidence of osseous metastatic disease with increased sclerosis involving the T2, T3, T4, T5, and T12 vertebra. Schmorl's node or lytic component of the lesion in the T1 vertebra. CT ABDOMEN PELVIS FINDINGS Hepatobiliary: A right hepatic lobe metastatic lesion measuring 5.5 by 3.0 cm on image 68/2 previously measured 2.1 by 1.5 cm on 06/05/2016. However, there has been some cystic degeneration/necrosis of some of the lesions resulting in reduction in size of some of the lesions. A new lesion in segment 4a measures 2.1 by 3.5 cm on image 57/2 and demonstrates heterogeneous enhancement. Overall the metastatic burden to the liver is considered increased. Contracted gallbladder. Pancreas: Unremarkable Spleen: Cystic lesion in the upper spleen. Adrenals/Urinary Tract: Right mid to lower kidney hypodense lesion compatible with cyst, stable. Adrenal glands normal. Urinary bladder unremarkable. Stomach/Bowel: Peg tube appears satisfactorily positioned. Orally administered contrast extends through  to the colon. Sigmoid diverticulosis. Paucity of intra- abdominal adipose tissue, along with diffuse edema of adipose tissue, makes separation of adjacent bowel loops difficult in the LEs opacified loops of bowel. Vascular/Lymphatic: Aortoiliac atherosclerotic vascular disease. A partially calcified porta hepatis mass measures 4.5 by 5.9 cm on image 61/2, previously 3.7 by 1.8 cm by my measurement. Reproductive: Unremarkable Other: Diffuse edema in the subcutaneous, mesenteric, and remaining omental adipose tissue. Cachexia. Musculoskeletal: Along the lateral superficial margin of the  right iliac crest and invading the right gluteus minimus muscle there is a new 6.6 by 2.6 cm mass. This tracks along the periosteum of the iliac crest. I favor malignancy over hematoma, there appears to be some new mixed sclerosis and lucency in the right iliac crest underlying this lesion. Enlarging sclerotic metastatic disease in the L3 vertebral body. There is considerable sclerosis eccentric to the left at L5-S1 but much of this is probably due to degenerative disc disease and facet arthropathy based on the overall appearance. Lumbar spondylosis does cause impingement of the L3-4, L4-5, and L5-S1 levels. IMPRESSION: 1. Progressive metastatic disease to the liver, skeleton, porta hepatis, and right gluteus minimus muscle. Enlarging nodule in the right as ago esophageal recess with potential early prevascular adenopathy. 2. Cachexia with diffuse edema in the subcutaneous, mesenteric, and remaining omental adipose tissue, possibly from third spacing of fluids. 3. Lumbar spondylosis and degenerative disc disease with lower lumbar impingement noted. 4. Other imaging findings of potential clinical significance: Coronary, aortic arch, and branch vessel atherosclerotic vascular disease. Moderate anterior pericardial effusion, increased from prior. Nonunited right first rib fracture, possibly pathologic. Electronically Signed   By: Van Clines M.D.   On: 10/06/2016 14:24   Ct Abdomen Pelvis W Contrast  Result Date: 10/06/2016 CLINICAL DATA:  Esophageal cancer with osseous and hepatic metastatic disease. EXAM: CT CHEST, ABDOMEN, AND PELVIS WITH CONTRAST TECHNIQUE: Multidetector CT imaging of the chest, abdomen and pelvis was performed following the standard protocol during bolus administration of intravenous contrast. CONTRAST:  168m ISOVUE-300 IOPAMIDOL (ISOVUE-300) INJECTION 61% COMPARISON:  06/05/2016 FINDINGS: CT CHEST FINDINGS Cardiovascular: Coronary, aortic arch, and branch vessel atherosclerotic vascular disease. Moderate anterior pericardial effusion, increased from prior. Mediastinum/Nodes: A soft tissue density just to the left of the take-off of the left subclavian artery measures 9 mm in short axis, formerly 4 mm in short axis, and is concerning for an enlarging prevascular lymph node. Right hilar node 0.8 cm in short axis. Rim calcified small subcarinal node. A 1.3 by 1.0 cm nodule or paraesophageal lymph node in the right as ago esophageal re- fit assess appears to correspond to a previous 0.6 by 0.7 cm nodule in this vicinity on the prior exam which previously had a more intrapulmonary appearance. This probably represents an enlarging pulmonary nodule. Lungs/Pleura: 3 mm nodule in the left upper lobe along the major fissure, image 86/4, formerly 2 mm. Musculoskeletal: Nonunited fracture the right first rib laterally with associated rib sclerosis, I cannot exclude a pathologic fracture. Rib deformities anteriorly on the right including some bridging between several ribs. Similar findings on the left laterally. Faint sclerosis in the left anterior second and third ribs, more notably in the second rib, possibly from a metastatic lesion, less likely some type of stress fracture. Increase in evidence of osseous metastatic disease with increased sclerosis involving the T2, T3, T4, T5, and T12 vertebra. Schmorl's node or lytic  component of the lesion in the T1 vertebra. CT ABDOMEN PELVIS FINDINGS Hepatobiliary: A right hepatic lobe metastatic lesion measuring 5.5 by 3.0 cm on image 68/2 previously measured 2.1 by 1.5 cm on 06/05/2016. However, there has been some cystic degeneration/necrosis of some of the lesions resulting in reduction in size of some of the lesions. A new lesion in segment 4a measures 2.1 by 3.5 cm on image 57/2 and demonstrates heterogeneous enhancement. Overall the metastatic burden to the liver is considered increased. Contracted gallbladder. Pancreas: Unremarkable Spleen: Cystic lesion in the upper spleen. Adrenals/Urinary Tract: Right  mid to lower kidney hypodense lesion compatible with cyst, stable. Adrenal glands normal. Urinary bladder unremarkable. Stomach/Bowel: Peg tube appears satisfactorily positioned. Orally administered contrast extends through to the colon. Sigmoid diverticulosis. Paucity of intra- abdominal adipose tissue, along with diffuse edema of adipose tissue, makes separation of adjacent bowel loops difficult in the LEs opacified loops of bowel. Vascular/Lymphatic: Aortoiliac atherosclerotic vascular disease. A partially calcified porta hepatis mass measures 4.5 by 5.9 cm on image 61/2, previously 3.7 by 1.8 cm by my measurement. Reproductive: Unremarkable Other: Diffuse edema in the subcutaneous, mesenteric, and remaining omental adipose tissue. Cachexia. Musculoskeletal: Along the lateral superficial margin of the right iliac crest and invading the right gluteus minimus muscle there is a new 6.6 by 2.6 cm mass. This tracks along the periosteum of the iliac crest. I favor malignancy over hematoma, there appears to be some new mixed sclerosis and lucency in the right iliac crest underlying this lesion. Enlarging sclerotic metastatic disease in the L3 vertebral body. There is considerable sclerosis eccentric to the left at L5-S1 but much of this is probably due to degenerative disc disease and  facet arthropathy based on the overall appearance. Lumbar spondylosis does cause impingement of the L3-4, L4-5, and L5-S1 levels. IMPRESSION: 1. Progressive metastatic disease to the liver, skeleton, porta hepatis, and right gluteus minimus muscle. Enlarging nodule in the right as ago esophageal recess with potential early prevascular adenopathy. 2. Cachexia with diffuse edema in the subcutaneous, mesenteric, and remaining omental adipose tissue, possibly from third spacing of fluids. 3. Lumbar spondylosis and degenerative disc disease with lower lumbar impingement noted. 4. Other imaging findings of potential clinical significance: Coronary, aortic arch, and branch vessel atherosclerotic vascular disease. Moderate anterior pericardial effusion, increased from prior. Nonunited right first rib fracture, possibly pathologic. Electronically Signed   By: Van Clines M.D.   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 S/p palliative radiation therapy to the esophagus and regional lymph nodes. He has tolerated treatment generally well except for some mild grade 2 radiation esophagitis which has now resolved. Repeat CT chest abdomen pelvis done 11/17/2015 show progression of his liver metastases and gastrohepatic lymph node as expected.  He has completed his 6 cycles of carboplatin and Taxol  With grade 1-2 fatigue and grade 1 neuropathy CT c/a/p with stable improved disease with some  T1 metastasis, as before. Additional lesions in the L3 vertebral body and right sacrum, corresponding to abnormalities on 04/06/2016 and worrisome for metastatic disease.  CT CAP 06/05/2016 -- significant progression of liver mets and concern for new abnormal thickening of the esophagus -likely  reflecting local tumor recurrence.  EGD done 06/26/2016 confirms local recurrence of SCC with PDL1 expression 0%  Patient had completed 3 cycle of XELIRI  Rpt CT chest/abd/pelvis 10/06/2016 - Progressive metastatic disease to the liver, skeleton, porta hepatis, and right gluteus minimus muscle.  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 This has improved and remains nonpainful. 8) neoplasm related  pain especially from bone metastases in his lower back - controlled better with adjustment of his pain medications 9) neoplasm related pain in the upper thoracic spine T1 (bone mets). Radiating to bilateral upper extremities suggesting C7 nerve root involvement. Resolved-completed palliative RT for this. 10)Bone mets  11) Rt thumb joint pain and swelling and hand swelling and Rt knee pain and swelling likely related to Gout.- resolved with steroids. Completing planned prednisone. Plan -I discussed with the patient in details the results of the restaging CT CAP which shows clear progression of disease --we discussed in details goals of care and choice of further treatment - Best support cares through hospice vs additional palliative chemotherapy (Docetaxel) -cannot use Pembrolizumab due to neg PDL1 status. -patient is very thankful for all the care he has received in the cancer center and effusively expressed his gratitude. -he understand his situation and is understandable pensive. -he would like more time to think prior to his final decision but notes that he is likely moving towards best supportive cares. He will let us know when he decideds. -continue tube  feeding as much as possible. . -Encouraged to maintain mobility . -continue fentanyl patch at 37.5 g/h and oxycodone 10-67m q4h prn for breakthrough pain -we can discontinue his Xgeva at this time.  -RTC with Dr KIrene Limboin 4 weeks with labs  All of his questions and concerns were answered in detail.  I spent 20 minutes counseling the patient face to face. The total time spent in the appointment was 25 minutes and more than 50% was on counseling and direct patient cares.    GSullivan LoneMD MSanatogaAAHIVMS SParkway Regional HospitalCCentral Star Psychiatric [facility] Facility FresnoHematology/Oncology Physician CSan Antonio State [facility] (Office):       [phone](Work cell):  [phone](Fax):           [phone]

## 2016-10-31 ENCOUNTER — Other Ambulatory Visit: Payer: Medicaid Other

## 2016-10-31 ENCOUNTER — Ambulatory Visit: Payer: Medicaid Other

## 2016-11-01 ENCOUNTER — Other Ambulatory Visit: Payer: Self-pay | Admitting: *Deleted

## 2016-11-01 ENCOUNTER — Ambulatory Visit: Payer: Medicaid Other

## 2016-11-01 DIAGNOSIS — C159 Malignant neoplasm of esophagus, unspecified: Secondary | ICD-10-CM

## 2016-11-01 MED ORDER — OXYCODONE HCL 5 MG/5ML PO SOLN: 10.0000 mg | ORAL | 0 refills | Status: DC | PRN

## 2016-11-01 MED FILL — oxyCODONE HCL 5 MG/5ML SOLN: 5 | 4 days supply | Qty: 473 | Fill #0

## 2016-11-07 ENCOUNTER — Ambulatory Visit (HOSPITAL_BASED_OUTPATIENT_CLINIC_OR_DEPARTMENT_OTHER): Payer: Medicaid Other | Admitting: Hematology

## 2016-11-07 ENCOUNTER — Encounter: Payer: Self-pay | Admitting: Hematology

## 2016-11-07 ENCOUNTER — Other Ambulatory Visit (HOSPITAL_BASED_OUTPATIENT_CLINIC_OR_DEPARTMENT_OTHER): Payer: Medicaid Other

## 2016-11-07 DIAGNOSIS — C787 Secondary malignant neoplasm of liver and intrahepatic bile duct: Secondary | ICD-10-CM

## 2016-11-07 DIAGNOSIS — G893 Neoplasm related pain (acute) (chronic): Secondary | ICD-10-CM

## 2016-11-07 DIAGNOSIS — C159 Malignant neoplasm of esophagus, unspecified: Secondary | ICD-10-CM

## 2016-11-07 DIAGNOSIS — C155 Malignant neoplasm of lower third of esophagus: Secondary | ICD-10-CM

## 2016-11-07 DIAGNOSIS — C7951 Secondary malignant neoplasm of bone: Secondary | ICD-10-CM

## 2016-11-07 LAB — CBC & DIFF AND RETIC
BASO%: 0.2 % (ref 0.0–2.0)
BASOS ABS: 0 10*3/uL (ref 0.0–0.1)
EOS%: 0.5 % (ref 0.0–7.0)
Eosinophils Absolute: 0 10*3/uL (ref 0.0–0.5)
HEMATOCRIT: 30.5 % — AB (ref 38.4–49.9)
HEMOGLOBIN: 10 g/dL — AB (ref 13.0–17.1)
IMMATURE RETIC FRACT: 18.7 % — AB (ref 3.00–10.60)
LYMPH#: 1.4 10*3/uL (ref 0.9–3.3)
LYMPH%: 22.6 % (ref 14.0–49.0)
MCH: 30.9 pg (ref 27.2–33.4)
MCHC: 32.8 g/dL (ref 32.0–36.0)
MCV: 94.1 fL (ref 79.3–98.0)
MONO#: 0.9 10*3/uL (ref 0.1–0.9)
MONO%: 14.7 % — ABNORMAL HIGH (ref 0.0–14.0)
NEUT#: 3.9 10*3/uL (ref 1.5–6.5)
NEUT%: 62 % (ref 39.0–75.0)
PLATELETS: 368 10*3/uL (ref 140–400)
RBC: 3.24 10*6/uL — AB (ref 4.20–5.82)
RDW: 20.9 % — ABNORMAL HIGH (ref 11.0–14.6)
RETIC CT ABS: 71.6 10*3/uL (ref 34.80–93.90)
Retic %: 2.21 % — ABNORMAL HIGH (ref 0.80–1.80)
WBC: 6.2 10*3/uL (ref 4.0–10.3)

## 2016-11-07 LAB — COMPREHENSIVE METABOLIC PANEL
ALBUMIN: 3.3 g/dL — AB (ref 3.5–5.0)
ALT: 9 U/L (ref 0–55)
ANION GAP: 11 meq/L (ref 3–11)
AST: 26 U/L (ref 5–34)
Alkaline Phosphatase: 116 U/L (ref 40–150)
BILIRUBIN TOTAL: 0.51 mg/dL (ref 0.20–1.20)
BUN: 9.9 mg/dL (ref 7.0–26.0)
CALCIUM: 9.8 mg/dL (ref 8.4–10.4)
CO2: 30 meq/L — AB (ref 22–29)
CREATININE: 0.6 mg/dL — AB (ref 0.7–1.3)
Chloride: 94 mEq/L — ABNORMAL LOW (ref 98–109)
Glucose: 103 mg/dl (ref 70–140)
Potassium: 4 mEq/L (ref 3.5–5.1)
Sodium: 134 mEq/L — ABNORMAL LOW (ref 136–145)
TOTAL PROTEIN: 7.5 g/dL (ref 6.4–8.3)

## 2016-11-07 MED ORDER — FENTANYL 50 MCG/HR TD PT72: 50.0000 ug | MEDICATED_PATCH | TRANSDERMAL | 0 refills | Status: AC

## 2016-11-07 MED ORDER — OXYCODONE HCL 5 MG/5ML PO SOLN: 10.0000 mg | ORAL | 0 refills | Status: DC | PRN

## 2016-11-07 MED ORDER — SENNA 8.8 MG/5ML PO SYRP: 5.0000 mL | ORAL_SOLUTION | Freq: Two times a day (BID) | ORAL | 2 refills | Status: AC

## 2016-11-07 NOTE — Progress Notes (Signed)
[name] Kitchen    HEMATOLOGY/ONCOLOGY CLINIC NOTE  Date of Service: .11/07/2016    Patient Care Team: Leanna Battles, MD as PCP - General (Internal Medicine)  CHIEF COMPLAINTS/PURPOSE OF CONSULTATION:   Metastatic Esophageal Squamous cell carcinoma Rt thumb and hand swelling - concerning for Gout.   HISTORY OF PRESENTING ILLNESS: plz see my initial consultation regarding patients initial presentation  INTERVAL HISTORY  Mr Ausborn is here for followup of his metastatic esophageal cancer for continue symptom control. He has enrolled in hospice cares since his last clinic visit. Notes some increased back and rt leg pain since last clinic visit and has been using his breakthrough oxycodone 2-4 times daily. We discussed and increased his fentanyl patch to 10mg daily. He notes constipation for the last 3 days. He has not being using his senna syrup-- was recommended to continue usign this. Notes dry mouth -- recommended using Biotin . Can also do salt and baking soda gargles.   MEDICAL HISTORY:   Past Medical History:  Diagnosis Date  . Arthritis   . [place]   [place]   . History of chemotherapy   . History of radiation therapy   . Hypertension   . Knee pain     SURGICAL HISTORY: Past Surgical History:  Procedure Laterality Date  . ESOPHAGOGASTRODUODENOSCOPY (EGD) WITH ESOPHAGEAL DILATION     and biopsy  . IR GENERIC HISTORICAL  07/05/2016   IR GASTROSTOMY TUBE MOD SED 07/05/2016 [facility]  . IR GENERIC HISTORICAL  07/07/2016   IR RADIOLOGIST EVAL & MGMT 07/07/2016 WArdis Rowan PA-C [facility]  . IR GENERIC HISTORICAL  07/20/2016   IR RADIOLOGIST EVAL & MGMT [facility]  . IRRIGATION AND DEBRIDEMENT KNEE Right 04/14/2016   Procedure: IRRIGATION AND DEBRIDEMENT RIGHT KNEE, ARTHROTOMY;  Surgeon: MParalee Cancel MD;  Location: [facility];  Service: Orthopedics;  Laterality: Right;  . KNEE SURGERY    . LESION REMOVAL  1969   hip   SOCIAL  HISTORY: Social History   Social History  . Marital status: Single    Spouse name: N/A  . Number of children: 2  . Years of education: N/A   Occupational History  . Manufacturing industrial curtains    Social History Main Topics  . Smoking status: Current Every Day Smoker    Packs/day: 0.50    Years: 20.00    Types: Cigarettes  . Smokeless tobacco: Never Used  . Alcohol use No  . Drug use: Yes    Types: Marijuana     Comment: last smoked 1 week ago  . Sexual activity: Yes   Other Topics Concern  . 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  FAMILY HISTORY: Family History  Problem Relation Age of Onset  . Esophageal cancer Father   . Cancer Father     nasopharyngeal   . Diabetes Sister   . Cancer Sister     multiple myeloma  . Colon cancer Neg Hx     ALLERGIES:  has No Known Allergies.  MEDICATIONS:  Current Outpatient Prescriptions  Medication Sig Dispense Refill  . docusate sodium (COLACE) 100 MG capsule Take 100 mg by mouth daily as needed for mild constipation.    . fentaNYL (DURAGESIC - DOSED MCG/HR) 50 MCG/HR Place 1 patch (50 mcg total) onto the skin every 3 (three) days. 10 patch 0  . lidocaine-prilocaine (EMLA) cream Apply 1 application topically once. (Patient not taking: Reported on 10/17/2016) 30 g 1  . Nutritional Supplements (FEEDING SUPPLEMENT, OSMOLITE 1.5 CAL,) LIQD Place 1,000 mLs into feeding tube 4 (four) times daily.    . ondansetron (ZOFRAN) 8 MG tablet Take 1 tablet (8 mg total) by mouth every 8 (eight) hours as needed for nausea or vomiting. (Patient not taking: Reported on 10/17/2016) 30 tablet 3  . [START ON  11/13/2016] oxyCODONE (ROXICODONE) 5 MG/5ML solution Take 10-20 mLs (10-20 mg total) by mouth every 4 (four) hours as needed for moderate pain or severe pain. 473 mL 0  . predniSONE (DELTASONE) 20 MG tablet Place 2 tablets (40 mg total) into feeding tube daily with breakfast. 10 tablet 0  . Sennosides (SENNA) 8.8 MG/5ML SYRP Take 5 mLs (8.8 mg total) by mouth 2 (two) times daily. 300 mL 2  . Water For Irrigation, Sterile (FREE WATER) SOLN Place 200 mLs into feeding tube 5 (five) times daily. 1000 mL 0   No current facility-administered medications for this visit.     REVIEW OF SYSTEMS:    10 Point review of Systems was done is negative except as noted above.  PHYSICAL EXAMINATION: ECOG PERFORMANCE STATUS: 2 AS per EPIC  .GENERAL:middle aged AAM ,alert, in no acute distress and comfortable SKIN: skin color, texture, turgor are normal, no rashes or significant lesions EYES: normal, conjunctiva are pink and non-injected, sclera clear OROPHARYNX:no exudate, no erythema and lips, buccal mucosa, and tongue normal  NECK: supple, no JVD, thyroid normal size, non-tender, without nodularity LYMPH:  no palpable lymphadenopathy in the cervical, axillary or inguinal LUNGS: clear to auscultation with normal respiratory effort HEART: regular rate & rhythm,  no murmurs and no lower extremity edema ABDOMEN: abdomen soft, non-tender, normoactive bowel sounds  Musculoskeletal: rt hand and thumb pain and swelling have nearly resolved. PSYCH: alert & oriented x 3 with fluent speech NEURO: some weakness in his hands Rt>left  LABORATORY DATA:  I have reviewed the data as listed. CBC Latest Ref Rng & Units 11/07/2016 10/06/2016 09/19/2016  WBC 4.0 - 10.3 10e3/uL 6.2 5.8 4.4  Hemoglobin 13.0 - 17.1 g/dL 10.0(L) 9.8(L) 10.7(L)  Hematocrit 38.4 - 49.9 % 30.5(L) 28.8(L) 32.4(L)  Platelets 140 - 400 10e3/uL 368 304 444(H)    . CMP Latest Ref Rng & Units 11/07/2016 10/06/2016 09/19/2016  Glucose 70 - 140 mg/dl 103 98  100  BUN 7.0 - 26.0 mg/dL 9.9 9.8 10.2  Creatinine 0.7 - 1.3 mg/dL 0.6(L) 0.6(L) 0.6(L)  Sodium 136 - 145 mEq/L 134(L) 131(L) 136  Potassium 3.5 - 5.1 mEq/L 4.0 3.7 4.2  Chloride 101 - 111 mmol/L - - -  CO2 22 - 29 mEq/L 30(H) 29 29  Calcium 8.4 - 10.4 mg/dL 9.8 9.2 9.6  Total Protein 6.4 - 8.3 g/dL 7.5 7.4 7.4  Total Bilirubin 0.20 - 1.20 mg/dL 0.51 1.02 0.62  Alkaline Phos 40 - 150 U/L 116 116 153(H)  AST 5 - 34 U/L 26 17 27   ALT 0 - 55 U/L 9 13 23      Radiology .No results found.  ASSESSMENT & PLAN:   55 yo AAM with ECOG PS of 2  with   1) Metastatic moderately to poorly differentiated 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 S/p palliative radiation therapy to the esophagus and regional lymph nodes. He has tolerated treatment generally well except for some mild grade 2 radiation esophagitis which has now resolved. Repeat CT chest abdomen pelvis done 11/17/2015 show progression of his liver metastases and gastrohepatic lymph node as expected.  He has completed his 6 cycles of carboplatin and Taxol  With grade 1-2 fatigue and grade 1 neuropathy CT c/a/p with stable improved disease with some  T1 metastasis, as before. Additional lesions in the L3 vertebral body and right sacrum, corresponding to abnormalities on 04/06/2016 and worrisome for metastatic disease.  CT CAP 06/05/2016 -- significant progression of liver mets and concern for new abnormal thickening of the esophagus -likely reflecting local tumor recurrence.  EGD done 06/26/2016 confirms local recurrence of SCC with PDL1 expression 0%  Patient had completed 3 cycle of XELIRI  Rpt CT chest/abd/pelvis 10/06/2016 - Progressive metastatic disease to the liver, skeleton, porta hepatis, and right gluteus minimus muscle.  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  EGD done 06/26/2016 confirms local recurrence of SCC with PDL1 expression 0%  3) Neoplasm related pain - increased pain in back and left leg.  4) ANxiety with some mild SOB  5) Opiate related constipation Plan -patient has enrolled in hospice services through [facility]. -increased fentanyl patch to 43mg/h -given prescription for Liq oxycodone [id number] as needed for breakthrough pain.      -continue tube feeding as much as possible. .       -Encouraged to maintain mobility .      - continue senna liq per g-tube 5-1040mID for opiate related constipation.      - biotin or artificial saliva or salt/bicarb mouthwash for dry mouth. May suck on sours      -continue best supportive cares thorough hospice.  -RTC with Dr KalIrene Limbo an as needed basis.  All of his questions and concerns were answered in detail.  I spent 20 minutes counseling the patient face to face. The total time spent in the appointment was 25 minutes and more than 50% was on counseling and direct patient cares.    GauSullivan Lone MS [place]HIVMS SCHHarper County Community HospitalHHawaii Medical Center Westmatology/Oncology Physician ConAsante Three Rivers Medical CenterOffice):       [phone] cell):  [phone]):           [phone]

## 2016-11-10 MED FILL — fentaNYL 50 MCG/HR PT72: 50 | 30 days supply | Qty: 10 | Fill #0

## 2016-11-10 MED FILL — SENNA-GRX 8.8 MG/5ML SYRP: 8.8 | 23 days supply | Qty: 236 | Fill #0

## 2016-11-13 ENCOUNTER — Other Ambulatory Visit: Payer: Self-pay | Admitting: *Deleted

## 2016-11-13 DIAGNOSIS — IMO0002 Reserved for concepts with insufficient information to code with codable children: Secondary | ICD-10-CM

## 2016-11-13 MED FILL — oxyCODONE HCL 5 MG/5ML SOLN: 5 | 4 days supply | Qty: 473 | Fill #0

## 2016-11-14 ENCOUNTER — Encounter (HOSPITAL_COMMUNITY): Payer: Self-pay | Admitting: Interventional Radiology

## 2016-11-14 ENCOUNTER — Other Ambulatory Visit: Payer: Self-pay | Admitting: Hematology

## 2016-11-14 ENCOUNTER — Ambulatory Visit (HOSPITAL_COMMUNITY)
Admission: RE | Admit: 2016-11-14 | Discharge: 2016-11-14 | Disposition: A | Discharge status: Home / Self Care | Payer: Medicaid Other | Source: Ambulatory Visit | Attending: Hematology | Admitting: Hematology

## 2016-11-14 DIAGNOSIS — K9423 Gastrostomy malfunction: Secondary | ICD-10-CM | POA: Insufficient documentation

## 2016-11-14 DIAGNOSIS — Y831 Surgical operation with implant of artificial internal device as the cause of abnormal reaction of the patient, or of later complication, without mention of misadventure at the time of the procedure: Secondary | ICD-10-CM | POA: Diagnosis not present

## 2016-11-14 DIAGNOSIS — IMO0002 Reserved for concepts with insufficient information to code with codable children: Secondary | ICD-10-CM

## 2016-11-14 DIAGNOSIS — Z8501 Personal history of malignant neoplasm of esophagus: Secondary | ICD-10-CM | POA: Insufficient documentation

## 2016-11-14 HISTORY — PX: IR GENERIC HISTORICAL: IMG1180011

## 2016-11-27 ENCOUNTER — Other Ambulatory Visit: Payer: Self-pay | Admitting: Hematology

## 2016-11-27 DIAGNOSIS — C159 Malignant neoplasm of esophagus, unspecified: Secondary | ICD-10-CM

## 2016-11-27 MED FILL — oxyCODONE HCL 5 MG/5ML SOLN: 5 | 4 days supply | Qty: 473 | Fill #0

## 2016-11-29 ENCOUNTER — Ambulatory Visit: Payer: Medicaid Other

## 2016-12-08 MED FILL — oxyCODONE HCL 5 MG/5ML SOLN: 5 | 8 days supply | Qty: 500 | Fill #0

## 2016-12-20 ENCOUNTER — Telehealth: Payer: Self-pay | Admitting: *Deleted

## 2016-12-20 NOTE — Telephone Encounter (Signed)
Copy of message provided to collaborative who returned call to [facility].

## 2016-12-20 NOTE — Telephone Encounter (Signed)
"  This is [facility] [phone]) calling about problems patient's mom is having with residual from tube feedings.  He receives 8 oz four times a day.  She checks for residual before each feeding.  Getting 30 cc residual with some previous medication visible medication in the residual.  The residual just sits there and won't go back down.  It takes about twenty minutes for each feeding. Patient says he is feeling a little nauseated with an occasional burning sensation.  He may have gas due to a 'pop' when they open the tube.  Should we spread the feedings farther apart, institute the Hospice standing order for simethicone 15 cc's every four hours for gas or indigestion.  Please call with any orders."  Mother says he is sitting upright and she flushes with water before and after medications.

## 2016-12-22 ENCOUNTER — Telehealth: Payer: Self-pay | Admitting: *Deleted

## 2016-12-22 NOTE — Telephone Encounter (Signed)
Would prefer hospice physician to sign death certificate for death declared by hospice RN.  thx GK

## 2016-12-22 NOTE — Telephone Encounter (Signed)
Received call from [facility] asking if Dr Irene Limbo will still be attending for pt.  She understands that he wants [facility]osp[facility] to do symptom management but will he sign death certificate in the event that he dies?  Message routed to Dr Launa Flight RN.  Call back # is [phone]

## 2016-12-22 NOTE — Telephone Encounter (Signed)
Called Hospice RN/[facility] & informed of Dr Grier Mitts message.

## 2017-02-02 DEATH — deceased

## 2017-11-30 ENCOUNTER — Other Ambulatory Visit: Payer: Self-pay | Admitting: Nurse Practitioner

## 2018-06-27 IMAGING — CT CT ABD-PELV W/ CM
3 of 5 series · 16 of 46 positions shown, 18 images · IV contrast (iopamidol)
Comparison: 04/10/2016

CLINICAL DATA: Metastatic esophageal carcinoma

EXAM:
CT CHEST, ABDOMEN, AND PELVIS WITH CONTRAST
TECHNIQUE: Multidetector CT imaging of the chest, abdomen and pelvis was
performed following the standard protocol during bolus
administration of intravenous contrast.
CONTRAST:  100mL [id number] IOPAMIDOL ([id number]) INJECTION 61%

[Series 2: cap with st · axial · 0.72mm/px · z∈[+744,+1258]mm · 11 of 125 slices shown, 13 images]
[im 11/125  soft-tissue]
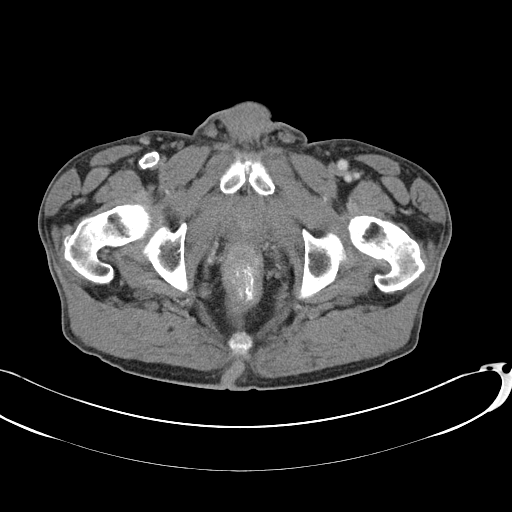
[im 11/125  bone]
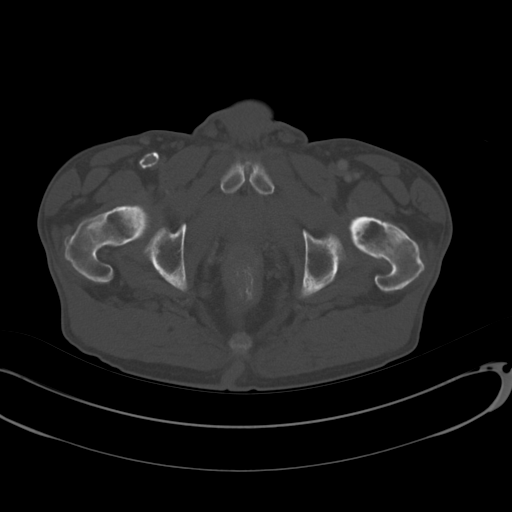
[im 21/125  soft-tissue]
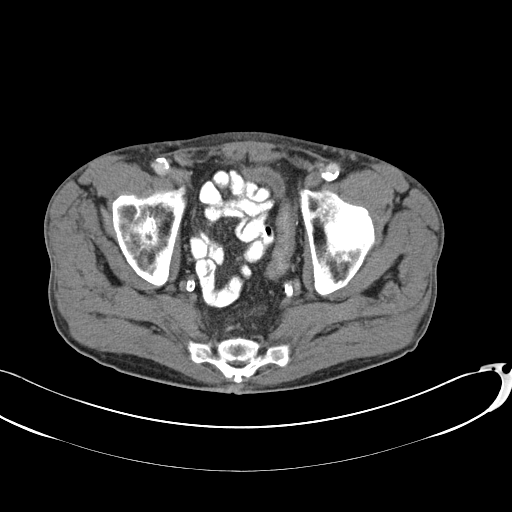
[im 32/125  soft-tissue]
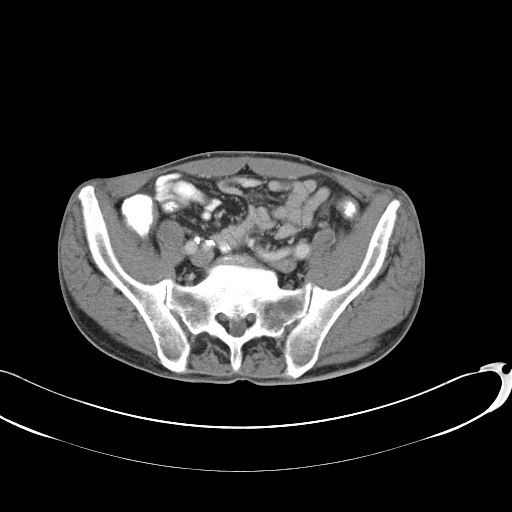
[im 42/125  soft-tissue]
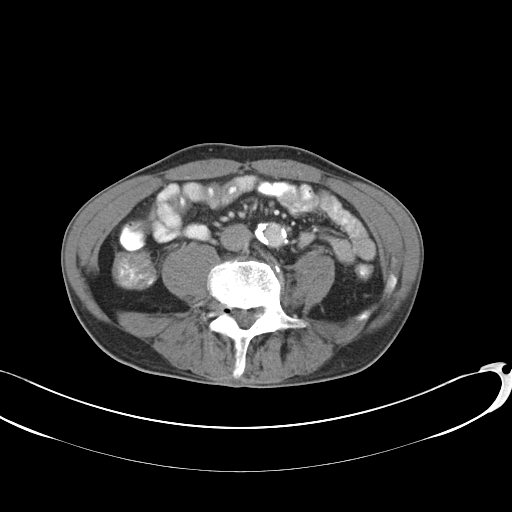
[im 52/125  soft-tissue]
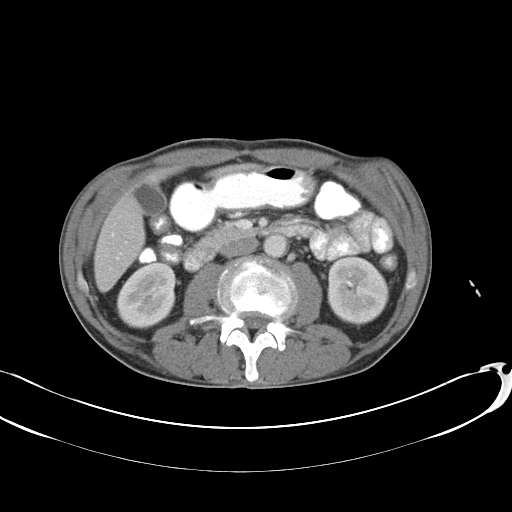
[im 63/125  soft-tissue]
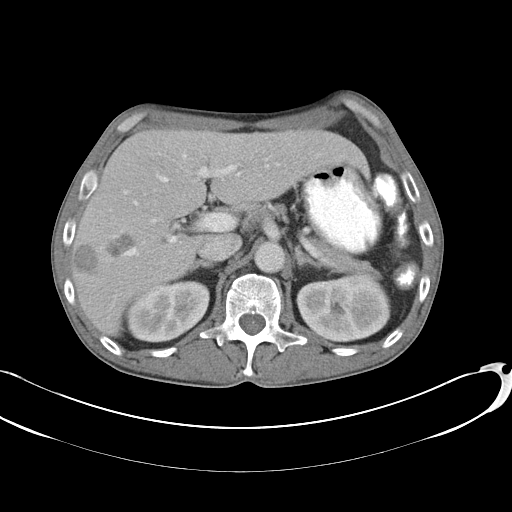
[im 73/125  soft-tissue]
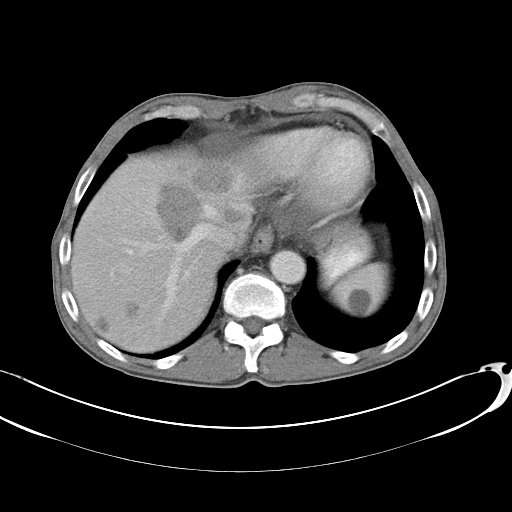
[im 83/125  soft-tissue]
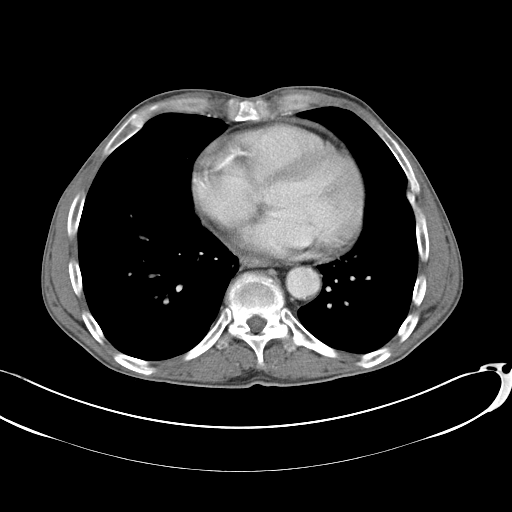
[im 94/125  soft-tissue]
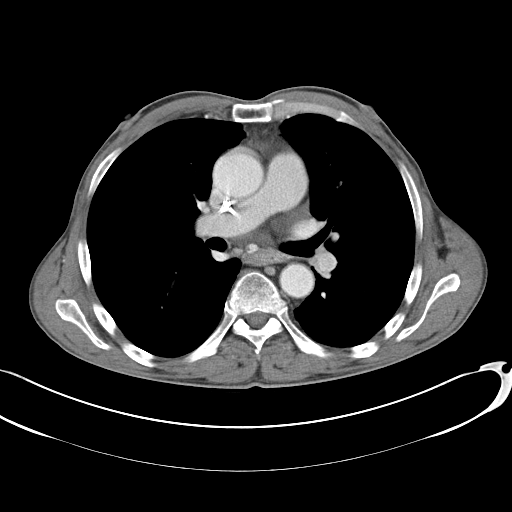
[im 94/125  bone]
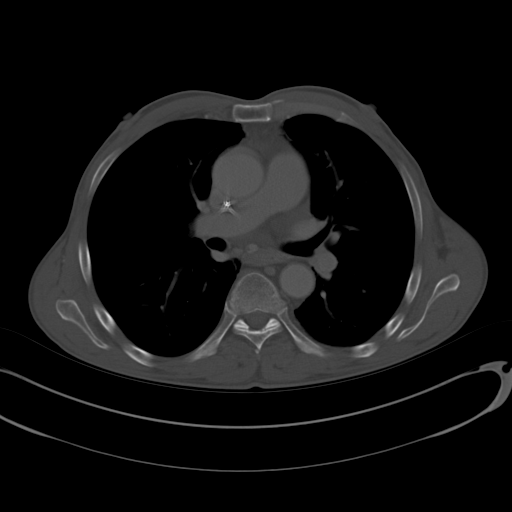
[im 104/125  soft-tissue]
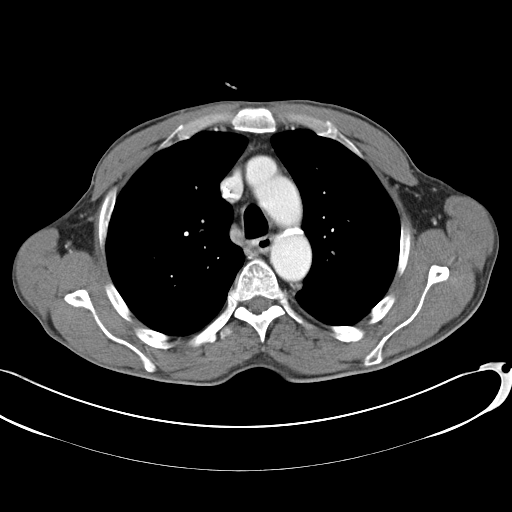
[im 114/125  soft-tissue]
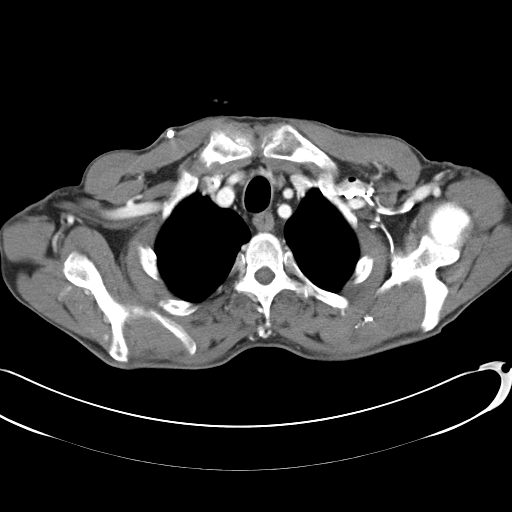

[Series 4: lung windows · axial · 0.72mm/px · z∈[+1032,+1072]mm · 2 of 152 slices shown]
[im 11/152  bone]
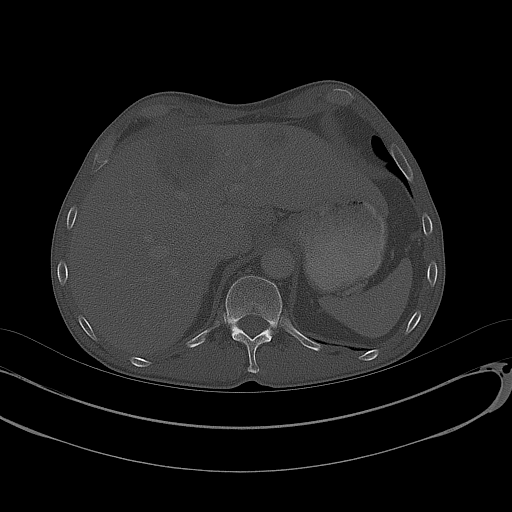
[im 31/152  bone]
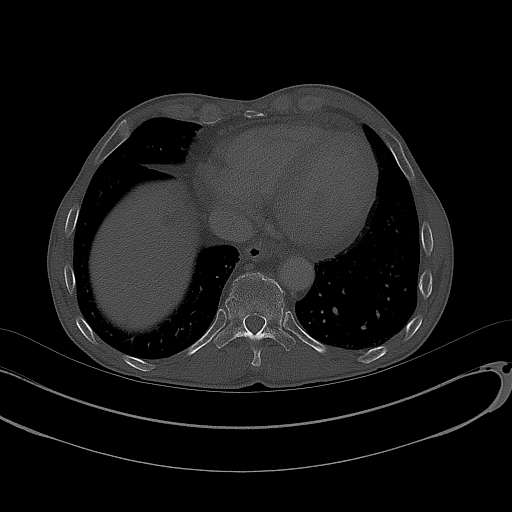

[Series 602: <mpr thick range> · coronal · 1.22mm/px · 3 of 72 slices shown]
[im 24/72  soft-tissue]
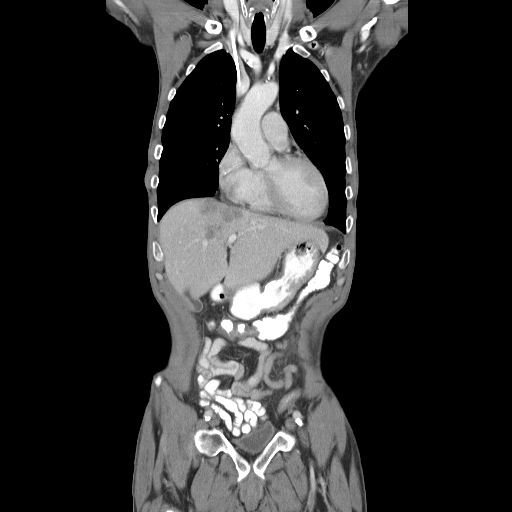
[im 32/72  soft-tissue]
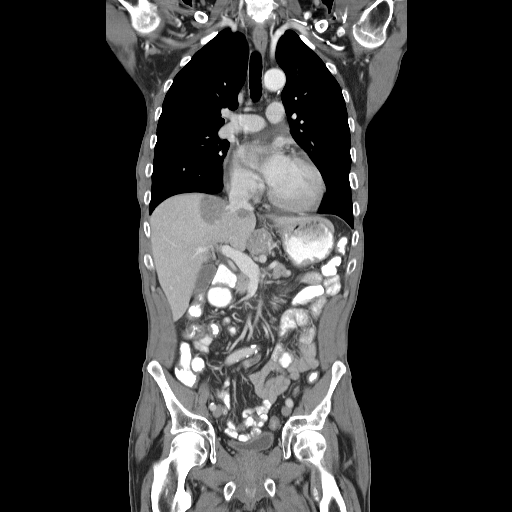
[im 40/72  soft-tissue]
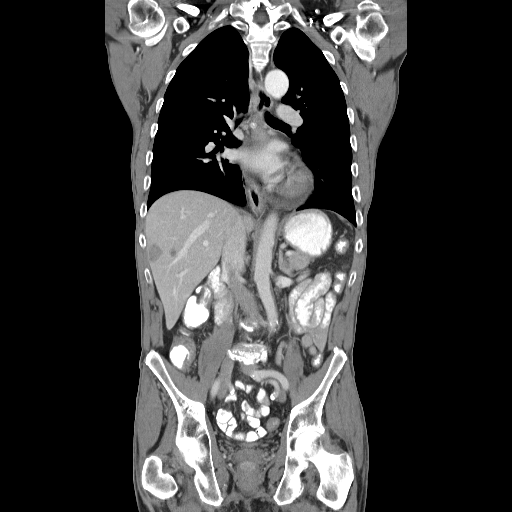

[16 of 46 positions shown; findings below may reference images not displayed]

FINDINGS: CT CHEST FINDINGS

Cardiovascular: Normal heart size. Aortic atherosclerosis.
Calcification involving the RCA, LAD and left circumflex coronary
artery noted.

Mediastinum/Nodes: The trachea appears patent and is midline. New
esophageal thickening measures 1.4 x 1.5 x 2.9 cm, image 35 of
series 2. No enlarged mediastinal or hilar lymph nodes.

Lungs/Pleura: No pleural effusion.  No suspicious pulmonary nodules.

Musculoskeletal: Similar appearance of 1.2 cm T1 lytic lesion, image
12 of series 4. Healing fracture of the right first rib is
identified.

CT ABDOMEN PELVIS FINDINGS

Hepatobiliary: Multifocal liver metastases are again noted. Index
lesion within segment 8 measures 4.8 cm, image 57 of series 2.
Previously 1.7 cm. Within the lateral segment of left lobe of liver
there is a 2.2 cm lesion, image 59 of series 2. New from previous
exam. There is a 2.1 cm lesion within lateral right lobe of liver,
image 63 of series 2. Previously 1.2 cm. The gallbladder is normal.
No biliary dilatation.

Pancreas: Unremarkable. No pancreatic ductal dilatation or
surrounding inflammatory changes.

Spleen: Simple appearing cyst within the spleen is unchanged
measuring 2.2 cm.

Adrenals/Urinary Tract: Normal appearance of the adrenal glands.
Small cyst within the right kidney measures 7 mm. No mass or
obstructive uropathy. The urinary bladder is normal.

Stomach/Bowel: The stomach is within normal limits. The small bowel
loops have a normal course and caliber. No obstruction. Normal
appearance of the colon. The appendix is visualized and appears
normal.

Vascular/Lymphatic: Calcified atherosclerotic disease involves the
abdominal aorta. No aneurysm. Gastrohepatic ligament lymph node has
a short axis of 1.6 cm, image 58 of series 2. Previously 1.4 cm.

Reproductive: Prostate gland is normal. Symmetric appearance of the
seminal vesicles.

Other: No free fluid or fluid collections within the abdomen or
pelvis.

Musculoskeletal: Degenerative disc disease is identified within the
lumbar spine. Similar appearance of abnormal sclerosis involving the
L3 vertebra.
IMPRESSION: 1. Significant interval progression of liver metastases.
2. New abnormal thickening of the esophagus which may reflect local
tumor recurrence.
3. Stable lytic lesion involving the T1 vertebra and sclerotic
lesion involving the L3 vertebra.
4. Similar appearance of gastrohepatic ligament lymph node.
